# Patient Record
Sex: Female | Born: 1945
Health system: Southern US, Community
[De-identification: ages and names within clinical notes are randomized; demographics above are authoritative.]

## PROBLEM LIST (undated history)

## (undated) DIAGNOSIS — K760 Fatty (change of) liver, not elsewhere classified: Secondary | ICD-10-CM

## (undated) DIAGNOSIS — Z8249 Family history of ischemic heart disease and other diseases of the circulatory system: Secondary | ICD-10-CM

## (undated) DIAGNOSIS — N183 Chronic kidney disease, stage 3 unspecified: Secondary | ICD-10-CM

## (undated) DIAGNOSIS — M12812 Other specific arthropathies, not elsewhere classified, left shoulder: Secondary | ICD-10-CM

## (undated) DIAGNOSIS — N200 Calculus of kidney: Secondary | ICD-10-CM

## (undated) DIAGNOSIS — M5010 Cervical disc disorder with radiculopathy, unspecified cervical region: Secondary | ICD-10-CM

## (undated) DIAGNOSIS — E782 Mixed hyperlipidemia: Secondary | ICD-10-CM

## (undated) DIAGNOSIS — G4700 Insomnia, unspecified: Secondary | ICD-10-CM

## (undated) DIAGNOSIS — K5790 Diverticulosis of intestine, part unspecified, without perforation or abscess without bleeding: Secondary | ICD-10-CM

## (undated) DIAGNOSIS — N3281 Overactive bladder: Secondary | ICD-10-CM

## (undated) DIAGNOSIS — M8580 Other specified disorders of bone density and structure, unspecified site: Secondary | ICD-10-CM

## (undated) DIAGNOSIS — M1990 Unspecified osteoarthritis, unspecified site: Secondary | ICD-10-CM

## (undated) DIAGNOSIS — Z1211 Encounter for screening for malignant neoplasm of colon: Secondary | ICD-10-CM

## (undated) DIAGNOSIS — R066 Hiccough: Secondary | ICD-10-CM

## (undated) DIAGNOSIS — S43011A Anterior subluxation of right humerus, initial encounter: Secondary | ICD-10-CM

## (undated) DIAGNOSIS — K5710 Diverticulosis of small intestine without perforation or abscess without bleeding: Secondary | ICD-10-CM

## (undated) DIAGNOSIS — H33311 Horseshoe tear of retina without detachment, right eye: Secondary | ICD-10-CM

## (undated) DIAGNOSIS — M2042 Other hammer toe(s) (acquired), left foot: Secondary | ICD-10-CM

## (undated) DIAGNOSIS — I10 Essential (primary) hypertension: Secondary | ICD-10-CM

## (undated) DIAGNOSIS — Z8669 Personal history of other diseases of the nervous system and sense organs: Secondary | ICD-10-CM

## (undated) DIAGNOSIS — N281 Cyst of kidney, acquired: Secondary | ICD-10-CM

## (undated) DIAGNOSIS — M75102 Unspecified rotator cuff tear or rupture of left shoulder, not specified as traumatic: Secondary | ICD-10-CM

## (undated) DIAGNOSIS — M2041 Other hammer toe(s) (acquired), right foot: Secondary | ICD-10-CM

## (undated) DIAGNOSIS — M25552 Pain in left hip: Secondary | ICD-10-CM

## (undated) HISTORY — PX: CYST EXCISION: SHX5701

## (undated) HISTORY — DX: Other specific arthropathies, not elsewhere classified, left shoulder: M12.812

## (undated) HISTORY — DX: Overactive bladder: N32.81

## (undated) HISTORY — DX: Horseshoe tear of retina without detachment, right eye: H33.311

## (undated) HISTORY — DX: Unspecified osteoarthritis, unspecified site: M19.90

## (undated) HISTORY — PX: OTHER SURGICAL HISTORY: SHX169

## (undated) HISTORY — DX: Unspecified rotator cuff tear or rupture of left shoulder, not specified as traumatic: M75.102

## (undated) HISTORY — DX: Mixed hyperlipidemia: E78.2

## (undated) HISTORY — DX: Chronic kidney disease, stage 3 unspecified: N18.30

## (undated) HISTORY — DX: Other hammer toe(s) (acquired), right foot: M20.41

## (undated) HISTORY — DX: Encounter for screening for malignant neoplasm of colon: Z12.11

## (undated) HISTORY — DX: Personal history of other diseases of the nervous system and sense organs: Z86.69

## (undated) HISTORY — DX: Cyst of kidney, acquired: N28.1

## (undated) HISTORY — DX: Other hammer toe(s) (acquired), left foot: M20.42

## (undated) HISTORY — DX: Anterior subluxation of right humerus, initial encounter: S43.011A

## (undated) HISTORY — DX: Insomnia, unspecified: G47.00

## (undated) HISTORY — DX: Family history of ischemic heart disease and other diseases of the circulatory system: Z82.49

## (undated) HISTORY — DX: Hiccough: R06.6

## (undated) HISTORY — DX: Diverticulosis of intestine, part unspecified, without perforation or abscess without bleeding: K57.90

## (undated) HISTORY — DX: Diverticulosis of small intestine without perforation or abscess without bleeding: K57.10

## (undated) HISTORY — DX: Cervical disc disorder with radiculopathy, unspecified cervical region: M50.10

## (undated) HISTORY — DX: Fatty (change of) liver, not elsewhere classified: K76.0

## (undated) HISTORY — DX: Pain in left hip: M25.552

## (undated) HISTORY — DX: Essential (primary) hypertension: I10

## (undated) HISTORY — DX: Calculus of kidney: N20.0

---
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
## 1990-01-06 HISTORY — PX: OTHER SURGICAL HISTORY: SHX169

## 1992-01-07 DIAGNOSIS — N200 Calculus of kidney: Secondary | ICD-10-CM

## 1992-01-07 HISTORY — DX: Calculus of kidney: N20.0

## 2004-01-07 HISTORY — PX: BUNIONECTOMY: SHX129

## 2006-07-07 HISTORY — PX: COLONOSCOPY: SHX174

## 2008-11-16 HISTORY — PX: OTHER SURGICAL HISTORY: SHX169

## 2011-02-05 DIAGNOSIS — Z79899 Other long term (current) drug therapy: Secondary | ICD-10-CM | POA: Diagnosis not present

## 2011-02-05 DIAGNOSIS — E781 Pure hyperglyceridemia: Secondary | ICD-10-CM | POA: Diagnosis not present

## 2011-02-05 DIAGNOSIS — I10 Essential (primary) hypertension: Secondary | ICD-10-CM | POA: Diagnosis not present

## 2011-02-05 DIAGNOSIS — E7800 Pure hypercholesterolemia, unspecified: Secondary | ICD-10-CM | POA: Diagnosis not present

## 2011-03-20 DIAGNOSIS — H43399 Other vitreous opacities, unspecified eye: Secondary | ICD-10-CM | POA: Diagnosis not present

## 2011-03-20 DIAGNOSIS — H2510 Age-related nuclear cataract, unspecified eye: Secondary | ICD-10-CM | POA: Diagnosis not present

## 2011-06-27 DIAGNOSIS — Z79899 Other long term (current) drug therapy: Secondary | ICD-10-CM | POA: Diagnosis not present

## 2011-06-27 DIAGNOSIS — E7800 Pure hypercholesterolemia, unspecified: Secondary | ICD-10-CM | POA: Diagnosis not present

## 2011-06-27 DIAGNOSIS — E781 Pure hyperglyceridemia: Secondary | ICD-10-CM | POA: Diagnosis not present

## 2011-07-16 DIAGNOSIS — E781 Pure hyperglyceridemia: Secondary | ICD-10-CM | POA: Diagnosis not present

## 2011-07-16 DIAGNOSIS — Z8249 Family history of ischemic heart disease and other diseases of the circulatory system: Secondary | ICD-10-CM | POA: Diagnosis not present

## 2011-07-16 DIAGNOSIS — I10 Essential (primary) hypertension: Secondary | ICD-10-CM | POA: Diagnosis not present

## 2011-09-17 DIAGNOSIS — Z23 Encounter for immunization: Secondary | ICD-10-CM | POA: Diagnosis not present

## 2011-10-29 DIAGNOSIS — Z01419 Encounter for gynecological examination (general) (routine) without abnormal findings: Secondary | ICD-10-CM | POA: Diagnosis not present

## 2011-10-29 DIAGNOSIS — N951 Menopausal and female climacteric states: Secondary | ICD-10-CM | POA: Diagnosis not present

## 2011-11-24 DIAGNOSIS — E7800 Pure hypercholesterolemia, unspecified: Secondary | ICD-10-CM | POA: Diagnosis not present

## 2011-11-24 DIAGNOSIS — Z23 Encounter for immunization: Secondary | ICD-10-CM | POA: Diagnosis not present

## 2011-11-24 DIAGNOSIS — I10 Essential (primary) hypertension: Secondary | ICD-10-CM | POA: Diagnosis not present

## 2011-11-24 DIAGNOSIS — Z1231 Encounter for screening mammogram for malignant neoplasm of breast: Secondary | ICD-10-CM | POA: Diagnosis not present

## 2011-11-24 DIAGNOSIS — E781 Pure hyperglyceridemia: Secondary | ICD-10-CM | POA: Diagnosis not present

## 2012-03-15 DIAGNOSIS — D2330 Other benign neoplasm of skin of unspecified part of face: Secondary | ICD-10-CM | POA: Diagnosis not present

## 2012-03-15 DIAGNOSIS — L723 Sebaceous cyst: Secondary | ICD-10-CM | POA: Diagnosis not present

## 2012-05-24 DIAGNOSIS — H43319 Vitreous membranes and strands, unspecified eye: Secondary | ICD-10-CM | POA: Diagnosis not present

## 2012-05-24 DIAGNOSIS — H25019 Cortical age-related cataract, unspecified eye: Secondary | ICD-10-CM | POA: Diagnosis not present

## 2012-05-26 DIAGNOSIS — Z136 Encounter for screening for cardiovascular disorders: Secondary | ICD-10-CM | POA: Diagnosis not present

## 2012-05-26 DIAGNOSIS — I10 Essential (primary) hypertension: Secondary | ICD-10-CM | POA: Diagnosis not present

## 2012-05-26 DIAGNOSIS — E7800 Pure hypercholesterolemia, unspecified: Secondary | ICD-10-CM | POA: Diagnosis not present

## 2012-06-03 DIAGNOSIS — E7800 Pure hypercholesterolemia, unspecified: Secondary | ICD-10-CM | POA: Diagnosis not present

## 2012-06-03 DIAGNOSIS — I10 Essential (primary) hypertension: Secondary | ICD-10-CM | POA: Diagnosis not present

## 2012-06-03 DIAGNOSIS — E781 Pure hyperglyceridemia: Secondary | ICD-10-CM | POA: Diagnosis not present

## 2012-11-17 DIAGNOSIS — Z01419 Encounter for gynecological examination (general) (routine) without abnormal findings: Secondary | ICD-10-CM | POA: Diagnosis not present

## 2012-11-17 DIAGNOSIS — N951 Menopausal and female climacteric states: Secondary | ICD-10-CM | POA: Diagnosis not present

## 2012-11-25 DIAGNOSIS — Z1231 Encounter for screening mammogram for malignant neoplasm of breast: Secondary | ICD-10-CM | POA: Diagnosis not present

## 2012-12-01 DIAGNOSIS — E781 Pure hyperglyceridemia: Secondary | ICD-10-CM | POA: Diagnosis not present

## 2012-12-01 DIAGNOSIS — E7800 Pure hypercholesterolemia, unspecified: Secondary | ICD-10-CM | POA: Diagnosis not present

## 2012-12-13 DIAGNOSIS — M6740 Ganglion, unspecified site: Secondary | ICD-10-CM | POA: Diagnosis not present

## 2012-12-13 DIAGNOSIS — G4700 Insomnia, unspecified: Secondary | ICD-10-CM | POA: Diagnosis not present

## 2012-12-13 DIAGNOSIS — Z0000 Encounter for general adult medical examination without abnormal findings: Secondary | ICD-10-CM | POA: Diagnosis not present

## 2013-01-17 DIAGNOSIS — M6740 Ganglion, unspecified site: Secondary | ICD-10-CM | POA: Diagnosis not present

## 2013-02-01 DIAGNOSIS — M6740 Ganglion, unspecified site: Secondary | ICD-10-CM | POA: Diagnosis not present

## 2013-02-23 DIAGNOSIS — Z136 Encounter for screening for cardiovascular disorders: Secondary | ICD-10-CM | POA: Diagnosis not present

## 2013-03-11 DIAGNOSIS — C44300 Unspecified malignant neoplasm of skin of unspecified part of face: Secondary | ICD-10-CM | POA: Diagnosis not present

## 2013-05-18 DIAGNOSIS — H25019 Cortical age-related cataract, unspecified eye: Secondary | ICD-10-CM | POA: Diagnosis not present

## 2013-05-18 DIAGNOSIS — H43319 Vitreous membranes and strands, unspecified eye: Secondary | ICD-10-CM | POA: Diagnosis not present

## 2013-10-12 DIAGNOSIS — Z23 Encounter for immunization: Secondary | ICD-10-CM | POA: Diagnosis not present

## 2013-11-09 ENCOUNTER — Encounter: Payer: Self-pay | Admitting: Family Medicine

## 2013-11-09 ENCOUNTER — Ambulatory Visit (INDEPENDENT_AMBULATORY_CARE_PROVIDER_SITE_OTHER): Payer: Medicare Other | Admitting: Family Medicine

## 2013-11-09 VITALS — BP 134/78 | HR 80 | Temp 97.9°F | Resp 18 | Ht 64.5 in | Wt 152.0 lb

## 2013-11-09 DIAGNOSIS — E782 Mixed hyperlipidemia: Secondary | ICD-10-CM | POA: Diagnosis not present

## 2013-11-09 DIAGNOSIS — F409 Phobic anxiety disorder, unspecified: Secondary | ICD-10-CM | POA: Insufficient documentation

## 2013-11-09 DIAGNOSIS — Z23 Encounter for immunization: Secondary | ICD-10-CM

## 2013-11-09 DIAGNOSIS — G4700 Insomnia, unspecified: Secondary | ICD-10-CM

## 2013-11-09 DIAGNOSIS — F5105 Insomnia due to other mental disorder: Secondary | ICD-10-CM | POA: Diagnosis not present

## 2013-11-09 DIAGNOSIS — I10 Essential (primary) hypertension: Secondary | ICD-10-CM

## 2013-11-09 MED ORDER — FENOFIBRATE 54 MG PO TABS: 54.0000 mg | ORAL_TABLET | Freq: Every day | ORAL | Status: DC

## 2013-11-09 MED ORDER — SIMVASTATIN 40 MG PO TABS: 40.0000 mg | ORAL_TABLET | Freq: Every day | ORAL | Status: DC

## 2013-11-09 MED ORDER — ZOLPIDEM TARTRATE 5 MG PO TABS: 5.0000 mg | ORAL_TABLET | Freq: Every evening | ORAL | Status: DC | PRN

## 2013-11-09 MED ORDER — LISINOPRIL 5 MG PO TABS: 5.0000 mg | ORAL_TABLET | Freq: Every day | ORAL | Status: DC

## 2013-11-09 NOTE — Progress Notes (Signed)
Spoke with pt, advised lab results. Pt understood. 

## 2013-11-09 NOTE — Assessment & Plan Note (Signed)
The current medical regimen is effective;  continue present plan and medications. BMET when fasting at pt's convenience.

## 2013-11-09 NOTE — Progress Notes (Addendum)
Office Note 11/09/2013  CC:  Chief Complaint  Patient presents with  . Establish Care   HPI:  Karen Bell is a 68 y.o. White female who is here to establish care. Patient's most recent primary MD: [facility] in [place].  Dr. Ouida Sills was her GYN there. Denies any hx of abnormal paps.  Last pap was 2013.  She got pneumovax and Tdap in 2011. Old records were not reviewed prior to or during today's visit except for a printout of a metabolic panel and lipid panel from 12/01/12 (all normal).  Historically well controlled HTN and hyperlipidemia. Describes sleep prob for decades.  no problem initiating sleep but wakes up frequently in the night and finds it difficult to get back to sleep. No depressed mood.  Trazodone has been rx'd but no help.  No other sleep aid tried.   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  No panic attacks.   Past Medical History  Diagnosis Date  . HTN (hypertension)   . Hyperlipidemia, mixed   . Family history of abdominal aortic aneurysm     pt has had AAA screening x 2, most recent 2014  . Insomnia     maintenance problems; trazodone no help  . History of migraine     These resolved after menopause  . Nephrolithiasis     Past Surgical History  Procedure Laterality Date  . Kidney stone extraction  1992    No procedures for this since then.  . Cesarean section  x 2   . Bunionectomy  2006    bilat  . Cyst excision      right index finger, base of fingernail  . Colonoscopy  2002;2012    recall 10 yrs    Family History  Problem Relation Age of Onset  . Cancer Mother     ? ovarian vs endometrial  . Hyperlipidemia Father   . Hypertension Father   . Bladder Cancer Brother   . Colon cancer      Negative history.  . Diabetes      History   Social History  . Marital Status: Unknown    Spouse Name: N/A    Number of Children: N/A  . Years of Education: N/A   Occupational  History  . Not on file.   Social History Main Topics  . Smoking status: Never Smoker   . Smokeless tobacco: Not on file  . Alcohol Use: Not on file  . Drug Use: Not on file  . Sexual Activity: Not on file   Other Topics Concern  . Not on file   Social History Narrative   Married, 2 daughters.  Four older brothers, 3 of which have had abdominal aneurisms.   Relocated from [place] area 05/2013.   Occupation: retired Research officer, political party.   No tob, occ alcohol.      Outpatient Encounter Prescriptions as of 11/09/2013  Medication Sig  . aspirin 81 MG tablet Take 81 mg by mouth daily.  . calcium citrate-vitamin D (CITRACAL+D) 315-200 MG-UNIT per tablet Take 1 tablet by mouth daily.  Marland Kitchen co-enzyme Q-10 30 MG capsule Take 300 mg by mouth 3 (three) times daily.  . fenofibrate 54 MG tablet Take 1 tablet (54 mg total) by mouth daily.  Marland Kitchen lisinopril (PRINIVIL,ZESTRIL) 5 MG tablet Take 1 tablet (5 mg total) by mouth daily.  . Multiple Vitamins-Minerals (CENTRUM SILVER PO) Take by mouth.  . Omega-3 Fatty Acids (  FISH OIL) 1200 MG CAPS Take 1,200 mg by mouth.  . simvastatin (ZOCOR) 40 MG tablet Take 1 tablet (40 mg total) by mouth daily.  Marland Kitchen zolpidem (AMBIEN) 5 MG tablet Take 1 tablet (5 mg total) by mouth at bedtime as needed for sleep.  . [DISCONTINUED] fenofibrate 54 MG tablet Take 54 mg by mouth daily.  . [DISCONTINUED] lisinopril (PRINIVIL,ZESTRIL) 5 MG tablet Take 5 mg by mouth daily.  . [DISCONTINUED] simvastatin (ZOCOR) 40 MG tablet Take 40 mg by mouth daily.  . [DISCONTINUED] traZODone (DESYREL) 50 MG tablet Take 50 mg by mouth at bedtime.  . [DISCONTINUED] zolpidem (AMBIEN) 5 MG tablet Take 1 tablet (5 mg total) by mouth at bedtime as needed for sleep.    Allergies  Allergen Reactions  . Erythromycin Other (See Comments)    Abdominal pain  . Tetracyclines & Related Other (See Comments)    Joint pain    ROS Review of Systems  Constitutional: Negative for fever and fatigue.  HENT:  Negative for congestion and sore throat.   Eyes: Negative for visual disturbance.  Respiratory: Negative for cough.   Cardiovascular: Negative for chest pain.  Gastrointestinal: Negative for nausea and abdominal pain.  Genitourinary: Negative for dysuria.  Musculoskeletal: Negative for back pain and joint swelling.  Skin: Negative for rash.  Neurological: Negative for weakness and headaches.  Hematological: Negative for adenopathy.  Psychiatric/Behavioral: Positive for sleep disturbance.    PE; Blood pressure 134/78, pulse 80, temperature 97.9 F (36.6 C), temperature source Oral, resp. rate 18, height 5' 4.5" (1.638 m), weight 152 lb (68.947 kg), SpO2 95 %. Gen: Alert, well appearing.  Patient is oriented to person, place, time, and situation. UDJ:SHFW: no injection, icteris, swelling, or exudate.  EOMI, PERRLA. Mouth: lips without lesion/swelling.  Oral mucosa pink and moist. Oropharynx without erythema, exudate, or swelling.  CV: RRR, no m/r/g.   LUNGS: CTA bilat, nonlabored resps, good aeration in all lung fields.  Pertinent labs:  none  ASSESSMENT AND PLAN:   New pt: obtain old records.  Essential hypertension The current medical regimen is effective;  continue present plan and medications. BMET when fasting at pt's convenience.  Hyperlipemia, mixed The current medical regimen is effective;  continue present plan and medications. FLP and AST/ALT at pt's earliest convenience.  Insomnia due to anxiety and fear D/c trazodone. Trial of ambien 5mg , 1 qhs prn.  Therapeutic expectations and side effect profile of medication discussed today.  Patient's questions answered. Gave rx for 30 d supply to local pharmacy and if this helps she can send in a printed 90d rx that has 1 additional RF--I handed her both rx's today.  Preventative health care: prevnar 13 IM today.  She recently had her flu vaccine for this season.  Pt prefers to have cervical cancer screening done via a GYN,  so she'll talk to her daughter about who she should see and will call here for referral in future.  An After Visit Summary was printed and given to the patient.  Return in about 6 months (around 05/10/2014) for routine chronic illness f/u; needs lab visit at her convenience for fasting labs (ordered).

## 2013-11-09 NOTE — Assessment & Plan Note (Signed)
The current medical regimen is effective;  continue present plan and medications. FLP and AST/ALT at pt's earliest convenience.

## 2013-11-09 NOTE — Assessment & Plan Note (Signed)
D/c trazodone. Trial of ambien 5mg , 1 qhs prn.  Therapeutic expectations and side effect profile of medication discussed today.  Patient's questions answered. Gave rx for 30 d supply to local pharmacy and if this helps she can send in a printed 90d rx that has 1 additional RF--I handed her both rx's today.

## 2013-11-10 ENCOUNTER — Telehealth: Payer: Self-pay | Admitting: Family Medicine

## 2013-11-10 NOTE — Telephone Encounter (Signed)
emmi emailed °

## 2013-12-20 ENCOUNTER — Other Ambulatory Visit (INDEPENDENT_AMBULATORY_CARE_PROVIDER_SITE_OTHER): Payer: Medicare Other

## 2013-12-20 DIAGNOSIS — I10 Essential (primary) hypertension: Secondary | ICD-10-CM | POA: Diagnosis not present

## 2013-12-20 DIAGNOSIS — G4700 Insomnia, unspecified: Secondary | ICD-10-CM | POA: Diagnosis not present

## 2013-12-20 DIAGNOSIS — E782 Mixed hyperlipidemia: Secondary | ICD-10-CM

## 2013-12-20 LAB — CBC WITH DIFFERENTIAL/PLATELET
Basophils Absolute: 0 10*3/uL (ref 0.0–0.1)
Basophils Relative: 0.6 % (ref 0.0–3.0)
Eosinophils Absolute: 0.1 10*3/uL (ref 0.0–0.7)
Eosinophils Relative: 1.8 % (ref 0.0–5.0)
HEMATOCRIT: 46 % (ref 36.0–46.0)
HEMOGLOBIN: 15.1 g/dL — AB (ref 12.0–15.0)
LYMPHS ABS: 2.6 10*3/uL (ref 0.7–4.0)
Lymphocytes Relative: 42.6 % (ref 12.0–46.0)
MCHC: 32.7 g/dL (ref 30.0–36.0)
MCV: 92 fl (ref 78.0–100.0)
MONOS PCT: 8.1 % (ref 3.0–12.0)
Monocytes Absolute: 0.5 10*3/uL (ref 0.1–1.0)
NEUTROS ABS: 2.8 10*3/uL (ref 1.4–7.7)
Neutrophils Relative %: 46.9 % (ref 43.0–77.0)
Platelets: 274 10*3/uL (ref 150.0–400.0)
RBC: 5 Mil/uL (ref 3.87–5.11)
RDW: 13.3 % (ref 11.5–15.5)
WBC: 6.1 10*3/uL (ref 4.0–10.5)

## 2013-12-20 LAB — LIPID PANEL
Cholesterol: 162 mg/dL (ref 0–200)
HDL: 49 mg/dL (ref >39.00)
LDL Cholesterol: 83 mg/dL (ref 0–99)
NonHDL: 113
Total CHOL/HDL Ratio: 3
Triglycerides: 152 mg/dL — ABNORMAL HIGH (ref 0.0–149.0)
VLDL: 30.4 mg/dL (ref 0.0–40.0)

## 2013-12-20 LAB — COMPREHENSIVE METABOLIC PANEL
ALT: 26 U/L (ref 0–35)
AST: 28 U/L (ref 0–37)
Albumin: 4.7 g/dL (ref 3.5–5.2)
Alkaline Phosphatase: 44 U/L (ref 39–117)
BUN: 22 mg/dL (ref 6–23)
CHLORIDE: 101 meq/L (ref 96–112)
CO2: 28 meq/L (ref 19–32)
CREATININE: 0.7 mg/dL (ref 0.4–1.2)
Calcium: 9.9 mg/dL (ref 8.4–10.5)
GFR: 84.19 mL/min (ref >60.00)
GLUCOSE: 86 mg/dL (ref 70–99)
Potassium: 4.3 mEq/L (ref 3.5–5.1)
Sodium: 135 mEq/L (ref 135–145)
Total Bilirubin: 0.6 mg/dL (ref 0.2–1.2)
Total Protein: 7.7 g/dL (ref 6.0–8.3)

## 2013-12-20 LAB — TSH: TSH: 4.61 u[IU]/mL — ABNORMAL HIGH (ref 0.35–4.50)

## 2014-03-20 DIAGNOSIS — D1801 Hemangioma of skin and subcutaneous tissue: Secondary | ICD-10-CM | POA: Diagnosis not present

## 2014-03-20 DIAGNOSIS — D225 Melanocytic nevi of trunk: Secondary | ICD-10-CM | POA: Diagnosis not present

## 2014-03-20 DIAGNOSIS — D2239 Melanocytic nevi of other parts of face: Secondary | ICD-10-CM | POA: Diagnosis not present

## 2014-03-20 DIAGNOSIS — L821 Other seborrheic keratosis: Secondary | ICD-10-CM | POA: Diagnosis not present

## 2014-03-20 DIAGNOSIS — D2262 Melanocytic nevi of left upper limb, including shoulder: Secondary | ICD-10-CM | POA: Diagnosis not present

## 2014-04-05 DIAGNOSIS — H40019 Open angle with borderline findings, low risk, unspecified eye: Secondary | ICD-10-CM | POA: Diagnosis not present

## 2014-04-05 DIAGNOSIS — H43811 Vitreous degeneration, right eye: Secondary | ICD-10-CM | POA: Diagnosis not present

## 2014-04-05 DIAGNOSIS — H1852 Epithelial (juvenile) corneal dystrophy: Secondary | ICD-10-CM | POA: Diagnosis not present

## 2014-04-05 DIAGNOSIS — H2513 Age-related nuclear cataract, bilateral: Secondary | ICD-10-CM | POA: Diagnosis not present

## 2014-04-05 DIAGNOSIS — H25013 Cortical age-related cataract, bilateral: Secondary | ICD-10-CM | POA: Diagnosis not present

## 2014-05-09 DIAGNOSIS — H40013 Open angle with borderline findings, low risk, bilateral: Secondary | ICD-10-CM | POA: Diagnosis not present

## 2014-05-12 ENCOUNTER — Ambulatory Visit (INDEPENDENT_AMBULATORY_CARE_PROVIDER_SITE_OTHER): Payer: Medicare Other | Admitting: Family Medicine

## 2014-05-12 ENCOUNTER — Encounter: Payer: Self-pay | Admitting: Family Medicine

## 2014-05-12 VITALS — BP 129/82 | HR 65 | Temp 99.1°F | Resp 16 | Ht 65.0 in | Wt 151.0 lb

## 2014-05-12 DIAGNOSIS — Z0000 Encounter for general adult medical examination without abnormal findings: Secondary | ICD-10-CM

## 2014-05-12 DIAGNOSIS — Z124 Encounter for screening for malignant neoplasm of cervix: Secondary | ICD-10-CM | POA: Diagnosis not present

## 2014-05-12 DIAGNOSIS — H9193 Unspecified hearing loss, bilateral: Secondary | ICD-10-CM | POA: Diagnosis not present

## 2014-05-12 NOTE — Progress Notes (Signed)
Pre visit review using our clinic review tool, if applicable. No additional management support is needed unless otherwise documented below in the visit note. 

## 2014-05-12 NOTE — Progress Notes (Signed)
The patient is here for annual Medicare wellness examination and management of other chronic and acute problems. She is relatively new to my practice, I saw her for the first time 11/09/13 after she recently relocated to the area.  Just got prior PCPs records and reviewed them today--updated info as appropriate.  HTN: Reviewed bp and HR measurements she has logged over the last few months and average bp is 120s over 70s, HR avg 70s.  Insomnia: did not fill zolpidem rx I gave her last time. Melatonin 5mg  qhs used for a couple of months and she found this helpful to reset things and she now sleeps 6-7 hours uninterrupted.  Doing fine OFF med now.     The risk factors are reflected in the social history.  The roster of all physicians providing medical care to patient - is listed in the Snapshot section of the chart.  Activities of daily living:  The patient is 100% inedpendent in all ADLs: dressing, toileting, feeding as well as independent mobility  Home safety : The patient has smoke detectors in the home. They wear seatbelts.No firearms at home ( firearms are present in the home, kept in a safe fashion). There is no violence in the home.   There is no risks for hepatitis, STDs or HIV. There is no history of blood transfusion. They have no travel history to infectious disease endemic areas of the world.  The patient has seen their dentist in the last six month. They have seen their eye doctor in the last year: optometrist at Dr. Payton Emerald office--everything fine. They admit to hearing difficulty and have not had audiologic testing in the last year.  Says audiology testing 20+ years ago when had similar c/o was normal.  Says background noise is the main factor.  They do not  have excessive sun exposure.   Discussed the need for sun protection: hats, long sleeves and use of sunscreen if there is significant sun exposure.   Diet: the importance of a healthy diet is discussed. They do have a healthy  diet.  Going through a health/wellness program at [facility] is noting improvement.    The patient has a regular exercise program: 30 min 3-5 days per week.  The benefits of regular aerobic exercise were discussed.  Depression screen: there are no signs or vegative symptoms of depression- irritability, change in appetite, anhedonia, sadness/tearfullness.  Fall risk assessment done today: no risk.  Cognitive assessment: the patient manages all their financial and personal affairs and is actively engaged.  They could relate day,date,year and events; recalled 3/3 objects at 3 minutes; performed clock-face test normally.  The following portions of the patient's history were reviewed and updated as appropriate: allergies, current medications, past family history, past medical history,  past surgical history, past social history  and problem list.  Vision, hearing, body mass index were assessed and reviewed.  BMI 25-discussed with pt today.  During the course of the visit the patient was educated and counseled about appropriate screening and preventive services including : fall prevention , diabetes screening, nutrition counseling, colorectal cancer screening, and recommended immunizations.  Plan: refer to GYN for cervical cancer and breast cancer screening, also for DEXA. No vaccines due, but she needs pneumovax 23 (final) after 11/09/2013. Diabetes screening done 12/2013. Next colon ca screening due 07/2016. Next screening for AAA (+strong FH) due spring 2017.  F/u: 6 mo

## 2014-05-12 NOTE — Patient Instructions (Signed)
Verify the date of most recent colonoscopy and most recent abdominal aortic aneurism screening ultrasound.-thx

## 2014-05-21 ENCOUNTER — Encounter: Payer: Self-pay | Admitting: Family Medicine

## 2014-06-06 DIAGNOSIS — Z01419 Encounter for gynecological examination (general) (routine) without abnormal findings: Secondary | ICD-10-CM | POA: Diagnosis not present

## 2014-06-06 DIAGNOSIS — Z124 Encounter for screening for malignant neoplasm of cervix: Secondary | ICD-10-CM | POA: Diagnosis not present

## 2014-07-03 DIAGNOSIS — Z1231 Encounter for screening mammogram for malignant neoplasm of breast: Secondary | ICD-10-CM | POA: Diagnosis not present

## 2014-09-14 DIAGNOSIS — Z23 Encounter for immunization: Secondary | ICD-10-CM | POA: Diagnosis not present

## 2014-11-13 ENCOUNTER — Encounter: Payer: Self-pay | Admitting: Family Medicine

## 2014-11-13 ENCOUNTER — Ambulatory Visit (INDEPENDENT_AMBULATORY_CARE_PROVIDER_SITE_OTHER): Payer: Medicare Other | Admitting: Family Medicine

## 2014-11-13 VITALS — BP 120/84 | HR 59 | Temp 97.8°F | Resp 16 | Ht 65.0 in | Wt 152.0 lb

## 2014-11-13 DIAGNOSIS — Z136 Encounter for screening for cardiovascular disorders: Secondary | ICD-10-CM

## 2014-11-13 DIAGNOSIS — R7989 Other specified abnormal findings of blood chemistry: Secondary | ICD-10-CM

## 2014-11-13 DIAGNOSIS — I10 Essential (primary) hypertension: Secondary | ICD-10-CM | POA: Diagnosis not present

## 2014-11-13 DIAGNOSIS — R946 Abnormal results of thyroid function studies: Secondary | ICD-10-CM | POA: Diagnosis not present

## 2014-11-13 DIAGNOSIS — E782 Mixed hyperlipidemia: Secondary | ICD-10-CM

## 2014-11-13 LAB — TSH: TSH: 2.851 u[IU]/mL (ref 0.350–4.500)

## 2014-11-13 LAB — LIPID PANEL
CHOL/HDL RATIO: 4.1 ratio (ref <=5.0)
Cholesterol: 168 mg/dL (ref 125–200)
HDL: 41 mg/dL — ABNORMAL LOW (ref >=46)
LDL Cholesterol: 92 mg/dL (ref <130)
Triglycerides: 174 mg/dL — ABNORMAL HIGH (ref <150)
VLDL: 35 mg/dL — AB (ref <30)

## 2014-11-13 LAB — COMPREHENSIVE METABOLIC PANEL
ALK PHOS: 46 U/L (ref 33–130)
ALT: 20 U/L (ref 6–29)
AST: 22 U/L (ref 10–35)
Albumin: 4.4 g/dL (ref 3.6–5.1)
BILIRUBIN TOTAL: 0.4 mg/dL (ref 0.2–1.2)
BUN: 20 mg/dL (ref 7–25)
CO2: 26 mmol/L (ref 20–31)
CREATININE: 0.73 mg/dL (ref 0.50–0.99)
Calcium: 9.2 mg/dL (ref 8.6–10.4)
Chloride: 100 mmol/L (ref 98–110)
GLUCOSE: 83 mg/dL (ref 65–99)
Potassium: 4.7 mmol/L (ref 3.5–5.3)
SODIUM: 138 mmol/L (ref 135–146)
Total Protein: 7.1 g/dL (ref 6.1–8.1)

## 2014-11-13 LAB — T4, FREE: Free T4: 0.95 ng/dL (ref 0.80–1.80)

## 2014-11-13 LAB — T3: T3, Total: 100.6 ng/dL (ref 80.0–204.0)

## 2014-11-13 MED ORDER — FENOFIBRATE 54 MG PO TABS: 54.0000 mg | ORAL_TABLET | Freq: Every day | ORAL | Status: DC

## 2014-11-13 MED ORDER — SIMVASTATIN 40 MG PO TABS: 40.0000 mg | ORAL_TABLET | Freq: Every day | ORAL | Status: DC

## 2014-11-13 MED ORDER — LISINOPRIL 5 MG PO TABS: 5.0000 mg | ORAL_TABLET | Freq: Every day | ORAL | Status: DC

## 2014-11-13 NOTE — Progress Notes (Signed)
OFFICE VISIT  11/13/2014   CC:  Chief Complaint  Patient presents with  . Follow-up    Pt is fasting.     HPI:    Patient is a 69 y.o. Caucasian female who presents for 6 mo f/u HTN, hyperlipidemia, hx of mildly elevated TSH w/out T4 or T3 testing. Compliant with chronic meds.  No acute complaints.  Since last visit here she went to Dr. Benjie Karvonen in [facility] and got pap smear and mammogram, which pt reports were both normal (06/2014).   Past Medical History  Diagnosis Date  . HTN (hypertension)   . Hyperlipidemia, mixed   . Family history of abdominal aortic aneurysm     pt has had AAA screening x 2, most recent was Spring 2015  . Insomnia     maintenance problems; trazodone no help  . History of migraine     These resolved after menopause  . Nephrolithiasis 1994  . Diverticulosis   . Abnormal Heart Score CT 02/06/09     CT heart score 91    Past Surgical History  Procedure Laterality Date  . Kidney stone extraction  1992    Cystoscopy: fragmented ureteral stone.  No procedures for this since then.  . Cesarean section  x 2     [place]  . Bunionectomy  2006    bilat  . Cyst excision      right index finger, base of fingernail  . Colonoscopy  2012    recall 10 yrs.  'tics.  . Carotid dopplers (screening for pad)  11/16/2008    "minor left carotid dz" per old records.  No hx of TIA/CVA.  Marland Kitchen Aortic ultrasound  02/13/13    no aneurism (screening for high risk)    Outpatient Prescriptions Prior to Visit  Medication Sig Dispense Refill  . aspirin 81 MG tablet Take 81 mg by mouth daily.    . calcium citrate-vitamin D (CITRACAL+D) 315-200 MG-UNIT per tablet Take 1 tablet by mouth daily.    . Cetirizine HCl (KLS ALLER-TEC PO) Take 10 mg by mouth daily.    . Multiple Vitamins-Minerals (CENTRUM SILVER PO) Take by mouth.    . Omega-3 Fatty Acids (FISH OIL) 1200 MG CAPS Take 1,200 mg by mouth.    . fenofibrate 54 MG tablet Take 1 tablet (54 mg total) by mouth daily. 90 tablet 3  .  lisinopril (PRINIVIL,ZESTRIL) 5 MG tablet Take 1 tablet (5 mg total) by mouth daily. 90 tablet 3  . simvastatin (ZOCOR) 40 MG tablet Take 1 tablet (40 mg total) by mouth daily. 90 tablet 3  . co-enzyme Q-10 30 MG capsule Take 300 mg by mouth 3 (three) times daily.    . niacin 500 MG tablet Take 500 mg by mouth at bedtime.     No facility-administered medications prior to visit.    Allergies  Allergen Reactions  . Erythromycin Other (See Comments)    Abdominal pain  . Tetracyclines & Related Other (See Comments)    Joint pain    ROS As per HPI  PE: Blood pressure 120/84, pulse 59, temperature 97.8 F (36.6 C), temperature source Oral, resp. rate 16, height 5\' 5"  (1.651 m), weight 152 lb (68.947 kg), SpO2 100 %. Gen: Alert, well appearing.  Patient is oriented to person, place, time, and situation. CV: RRR, no m/r/g.   LUNGS: CTA bilat, nonlabored resps, good aeration in all lung fields.   LABS:  Lab Results  Component Value Date   TSH  4.61* 12/20/2013   Lab Results  Component Value Date   WBC 6.1 12/20/2013   HGB 15.1* 12/20/2013   HCT 46.0 12/20/2013   MCV 92.0 12/20/2013   PLT 274.0 12/20/2013   Lab Results  Component Value Date   CREATININE 0.7 12/20/2013   BUN 22 12/20/2013   NA 135 12/20/2013   K 4.3 12/20/2013   CL 101 12/20/2013   CO2 28 12/20/2013   Lab Results  Component Value Date   ALT 26 12/20/2013   AST 28 12/20/2013   ALKPHOS 44 12/20/2013   BILITOT 0.6 12/20/2013   Lab Results  Component Value Date   CHOL 162 12/20/2013   Lab Results  Component Value Date   HDL 49.00 12/20/2013   Lab Results  Component Value Date   LDLCALC 83 12/20/2013   Lab Results  Component Value Date   TRIG 152.0* 12/20/2013   Lab Results  Component Value Date   CHOLHDL 3 12/20/2013    IMPRESSION AND PLAN:  1) HTN; The current medical regimen is effective;  continue present plan and medications. Lytes/cr today.  2) Hyperlipidemia: The current medical  regimen is effective;  continue present plan and medications. FLP, AST/ALT today.  3) Elevated TSH: just barely elevated 12/2013.  Will recheck this today and check T4 and T3 as well.  4) FH of aortic aneurism: needs q2 yr aortic aneurism screening, ordered her next one today to be set for 02/2015.  5) Prev health care: due for pneumovax, so she'll get this at her son's pharmacy ASAP.  An After Visit Summary was printed and given to the patient.  FOLLOW UP: Return in about 6 months (around 05/13/2015) for medicare AWV.

## 2014-11-13 NOTE — Progress Notes (Signed)
Pre visit review using our clinic review tool, if applicable. No additional management support is needed unless otherwise documented below in the visit note. 

## 2015-02-07 ENCOUNTER — Telehealth: Payer: Self-pay | Admitting: *Deleted

## 2015-02-07 DIAGNOSIS — Z780 Asymptomatic menopausal state: Secondary | ICD-10-CM

## 2015-02-07 NOTE — Telephone Encounter (Signed)
-----   Message from Nickola Major sent at 02/07/2015  8:57 AM EST ----- Patient is going for her [place], Hoyle Sauer from [employer] said the patient would like to get her bone density exam done on the same day. Can you put in an order? Hoyle Sauer said it's okay to use preventative or post menopausal for dx. Thanks

## 2015-02-07 NOTE — Telephone Encounter (Signed)
Please advise. Thanks.  

## 2015-02-07 NOTE — Telephone Encounter (Signed)
OK, order is in!

## 2015-02-13 ENCOUNTER — Ambulatory Visit (HOSPITAL_BASED_OUTPATIENT_CLINIC_OR_DEPARTMENT_OTHER)
Admission: RE | Admit: 2015-02-13 | Discharge: 2015-02-13 | Disposition: A | Discharge status: Home / Self Care | Payer: Medicare HMO | Source: Ambulatory Visit | Attending: Family Medicine | Admitting: Family Medicine

## 2015-02-13 ENCOUNTER — Encounter (HOSPITAL_BASED_OUTPATIENT_CLINIC_OR_DEPARTMENT_OTHER): Payer: Self-pay

## 2015-02-13 DIAGNOSIS — Z136 Encounter for screening for cardiovascular disorders: Secondary | ICD-10-CM | POA: Diagnosis not present

## 2015-02-13 DIAGNOSIS — Z780 Asymptomatic menopausal state: Secondary | ICD-10-CM | POA: Insufficient documentation

## 2015-02-13 DIAGNOSIS — Z13820 Encounter for screening for osteoporosis: Secondary | ICD-10-CM | POA: Diagnosis not present

## 2015-02-13 DIAGNOSIS — Z8249 Family history of ischemic heart disease and other diseases of the circulatory system: Secondary | ICD-10-CM | POA: Diagnosis not present

## 2015-02-13 DIAGNOSIS — M810 Age-related osteoporosis without current pathological fracture: Secondary | ICD-10-CM | POA: Diagnosis not present

## 2015-03-08 NOTE — Telephone Encounter (Signed)
BMD done on 02/13/15.

## 2015-04-07 DIAGNOSIS — M75102 Unspecified rotator cuff tear or rupture of left shoulder, not specified as traumatic: Secondary | ICD-10-CM

## 2015-04-07 DIAGNOSIS — M12812 Other specific arthropathies, not elsewhere classified, left shoulder: Secondary | ICD-10-CM

## 2015-04-07 HISTORY — DX: Other specific arthropathies, not elsewhere classified, left shoulder: M12.812

## 2015-04-07 HISTORY — DX: Unspecified rotator cuff tear or rupture of left shoulder, not specified as traumatic: M75.102

## 2015-04-11 ENCOUNTER — Encounter: Payer: Self-pay | Admitting: Family Medicine

## 2015-04-11 ENCOUNTER — Ambulatory Visit (INDEPENDENT_AMBULATORY_CARE_PROVIDER_SITE_OTHER): Payer: Medicare HMO | Admitting: Family Medicine

## 2015-04-11 VITALS — BP 126/83 | HR 77 | Temp 98.1°F | Resp 16 | Ht 65.0 in | Wt 151.5 lb

## 2015-04-11 DIAGNOSIS — M7522 Bicipital tendinitis, left shoulder: Secondary | ICD-10-CM | POA: Diagnosis not present

## 2015-04-11 DIAGNOSIS — M129 Arthropathy, unspecified: Secondary | ICD-10-CM | POA: Diagnosis not present

## 2015-04-11 DIAGNOSIS — M19019 Primary osteoarthritis, unspecified shoulder: Secondary | ICD-10-CM

## 2015-04-11 DIAGNOSIS — K219 Gastro-esophageal reflux disease without esophagitis: Secondary | ICD-10-CM | POA: Diagnosis not present

## 2015-04-11 NOTE — Patient Instructions (Signed)
Buy otc generic zantac 150mg  and take 1 tab twice a day for your reflux.  If this is not helpful in 4-5 days, stop this med and try generic otc prilosec once a day in the morning before eating.

## 2015-04-11 NOTE — Progress Notes (Signed)
Pre visit review using our clinic review tool, if applicable. No additional management support is needed unless otherwise documented below in the visit note. 

## 2015-04-11 NOTE — Progress Notes (Signed)
OFFICE VISIT  04/11/2015   CC:  Chief Complaint  Patient presents with  . Shoulder Pain    left shoulder x 6 months   HPI:    Patient is a 70 y.o. Caucasian female who presents for left shoulder pain. Onset about 6 mo ago, mostly over deltoid , occ over tip of acromion, and also sometimes L upper chest wall. Raising arm exacerbates the pain.  Some mornings there is a dull ache w/out movement, but most of the time if she sits w arm in neutral position she has no pain.  No distinct injury recalled but she recalls a couple of things she does that make her use that arm more.  Intensity of the pain is 6-7/10 for brief periods with movements.  Occ flash of tingling down L forearm and occ "odd" dullness feeling in fingertips.  Says she always has "tightness" in neck, esp on L side.  Says she recalls being told she had arthritis in neck.  Not trying any meds for it.  Recent 1 week of worsened GERD, substernal fullness/burning that even goes up into neck at times.  Lots of burping after eating.  Not taking any NSAIDs.  Past Medical History  Diagnosis Date  . HTN (hypertension)   . Hyperlipidemia, mixed   . Family history of abdominal aortic aneurysm     pt has had AAA screening x 2, most recent was 02/2015.  Marland Kitchen Insomnia     maintenance problems; trazodone no help  . History of migraine     These resolved after menopause  . Nephrolithiasis 1994  . Diverticulosis   . Abnormal Heart Score CT 02/06/09     CT heart score 91    Past Surgical History  Procedure Laterality Date  . Kidney stone extraction  1992    Cystoscopy: fragmented ureteral stone.  No procedures for this since then.  . Cesarean section  x 2     [place]  . Bunionectomy  2006    bilat  . Cyst excision      right index finger, base of fingernail  . Colonoscopy  2012    recall 10 yrs.  'tics.  . Carotid dopplers (screening for pad)  11/16/2008    "minor left carotid dz" per old records.  No hx of TIA/CVA.  Marland Kitchen Aortic  ultrasound  02/13/13; 02/2015    no aneurism (screening for high risk)    Outpatient Prescriptions Prior to Visit  Medication Sig Dispense Refill  . aspirin 81 MG tablet Take 81 mg by mouth daily.    . calcium citrate-vitamin D (CITRACAL+D) 315-200 MG-UNIT per tablet Take 1 tablet by mouth daily.    . fenofibrate 54 MG tablet Take 1 tablet (54 mg total) by mouth daily. 90 tablet 3  . lisinopril (PRINIVIL,ZESTRIL) 5 MG tablet Take 1 tablet (5 mg total) by mouth daily. 90 tablet 3  . Multiple Vitamins-Minerals (CENTRUM SILVER PO) Take by mouth.    . Omega-3 Fatty Acids (FISH OIL) 1200 MG CAPS Take 1,200 mg by mouth.    . simvastatin (ZOCOR) 40 MG tablet Take 1 tablet (40 mg total) by mouth daily. 90 tablet 3  . Cetirizine HCl (KLS ALLER-TEC PO) Take 10 mg by mouth daily. Reported on 04/11/2015     No facility-administered medications prior to visit.    Allergies  Allergen Reactions  . Erythromycin Other (See Comments)    Abdominal pain  . Tetracyclines & Related Other (See Comments)  Joint pain    ROS As per HPI  PE: Blood pressure 126/83, pulse 77, temperature 98.1 F (36.7 C), temperature source Oral, resp. rate 16, height 5\' 5"  (1.651 m), weight 151 lb 8 oz (68.72 kg), SpO2 96 %. Gen: Alert, well appearing.  Patient is oriented to person, place, time, and situation. Neck: no TTP.  ROM fully intact.  Left shoulder without tenderness except for the bicipital groove and also focally over the L AC joint.  No impingement signs.  ABduction with mild pain over AC area.  ER/IR w/out limitation or pain. +Speed's and Yergason's tests.  LABS:  none  IMPRESSION AND PLAN:  1) Left shoulder pain: suspect L AC joint arthritis + L bicipital tendonitis. Pt averse to taking NSAIDS, esp with GERD problems lately. Refer to sports medicine for further eval/mgmt.  2) GERD: Pt instructions: Buy otc generic zantac 150mg  and take 1 tab twice a day for your reflux.  If this is not helpful in 4-5  days, stop this med and try generic otc prilosec once a day in the morning before eating.  An After Visit Summary was printed and given to the patient.  FOLLOW UP: Return if symptoms worsen or fail to improve.  Signed:  Crissie Sickles, MD           04/11/2015

## 2015-05-01 ENCOUNTER — Ambulatory Visit (INDEPENDENT_AMBULATORY_CARE_PROVIDER_SITE_OTHER): Payer: Medicare HMO | Admitting: Family Medicine

## 2015-05-01 ENCOUNTER — Other Ambulatory Visit (INDEPENDENT_AMBULATORY_CARE_PROVIDER_SITE_OTHER): Payer: Medicare HMO

## 2015-05-01 ENCOUNTER — Ambulatory Visit (INDEPENDENT_AMBULATORY_CARE_PROVIDER_SITE_OTHER)
Admission: RE | Admit: 2015-05-01 | Discharge: 2015-05-01 | Disposition: A | Discharge status: Home / Self Care | Payer: Medicare HMO | Source: Ambulatory Visit | Attending: Family Medicine | Admitting: Family Medicine

## 2015-05-01 ENCOUNTER — Encounter: Payer: Self-pay | Admitting: Family Medicine

## 2015-05-01 VITALS — BP 104/72 | HR 84 | Ht 65.0 in | Wt 153.0 lb

## 2015-05-01 DIAGNOSIS — M25512 Pain in left shoulder: Secondary | ICD-10-CM

## 2015-05-01 DIAGNOSIS — M5031 Other cervical disc degeneration,  high cervical region: Secondary | ICD-10-CM | POA: Diagnosis not present

## 2015-05-01 DIAGNOSIS — M12512 Traumatic arthropathy, left shoulder: Secondary | ICD-10-CM

## 2015-05-01 DIAGNOSIS — M5010 Cervical disc disorder with radiculopathy, unspecified cervical region: Secondary | ICD-10-CM | POA: Diagnosis not present

## 2015-05-01 DIAGNOSIS — M12812 Other specific arthropathies, not elsewhere classified, left shoulder: Secondary | ICD-10-CM | POA: Insufficient documentation

## 2015-05-01 DIAGNOSIS — M75102 Unspecified rotator cuff tear or rupture of left shoulder, not specified as traumatic: Secondary | ICD-10-CM

## 2015-05-01 MED ORDER — GABAPENTIN 100 MG PO CAPS: 200.0000 mg | ORAL_CAPSULE | Freq: Every day | ORAL | Status: DC

## 2015-05-01 NOTE — Assessment & Plan Note (Signed)
Patient does have pain mild rotator cuff tear but also has atrophy. Patient's atrophy may be secondary to neurologic component of the cervical spine. Patient does have fairly good strength on exam today of the rotator cuff. Patient has elected to decline injection today. Patient will start with home exercises and work with Product/process development scientist. Given topical anti-inflammatory to try. We discussed icing regimen. What activities to avoid. Follow up again in 3-4 weeks. If continuing have pain I would consider injection as well as formal physical therapy.

## 2015-05-01 NOTE — Progress Notes (Signed)
Pre visit review using our clinic review tool, if applicable. No additional management support is needed unless otherwise documented below in the visit note. 

## 2015-05-01 NOTE — Assessment & Plan Note (Signed)
Karen Bell the patient does have some cervical radiculopathy. X-rays ordered today for further evaluation. I do believe the patient will respond to gabapentin. We discussed postural control and patient work with Product/process development scientist to learn scapular strengthening and posture. Patient come back again in 3-4 weeks. If any worsening weakness we may need to consider advanced imaging.

## 2015-05-01 NOTE — Patient Instructions (Signed)
Good to see you Xray downstairs today  Ice 20 minutes 2 times daily. Usually after activity and before bed. Exercises 3 times a week.  Avoid overhead lifting pennsaid pinkie amount topically 2 times daily as needed.  Vitamin D 2000 IU daily  Turmeric 500 mg daily  See me again in 3 weeks and if not better we will try injection or physical therapy

## 2015-05-01 NOTE — Progress Notes (Signed)
[name] [place], [place] [address] Phone: [phone] Subjective:    I'm seeing this patient by the request  of:  MCGOWEN,PHILIP H, MD  CC: left shoulder pain  [id number] Karen Bell is a 70 y.o. female coming in with complaint of left shoulder pain. Been going on for proximal he 7 months. Patient has been trying to lift weights over the course last 2 months it seemed to exacerbate again more. States that it seems to be on the lateral aspect of the shoulder but can radiate down to her hand. Sometimes associated with some numbness. Denies any weakness. Can wake her up at night. Rates the severity of 7 out of 10. May be some association with some neck pain. Does not remember any specific injury. Numbness is intermittent and would deny any weakness once again.     Past Medical History  Diagnosis Date  . HTN (hypertension)   . Hyperlipidemia, mixed   . Family history of abdominal aortic aneurysm     pt has had AAA screening x 2, most recent was 02/2015.  Marland Kitchen Insomnia     maintenance problems; trazodone no help  . History of migraine     These resolved after menopause  . Nephrolithiasis 1994  . Diverticulosis   . Abnormal Heart Score CT 02/06/09     CT heart score 91   Past Surgical History  Procedure Laterality Date  . Kidney stone extraction  1992    Cystoscopy: fragmented ureteral stone.  No procedures for this since then.  . Cesarean section  x 2     [facility]  . Bunionectomy  2006    bilat  . Cyst excision      right index finger, base of fingernail  . Colonoscopy  2012    recall 10 yrs.  'tics.  . Carotid dopplers (screening for pad)  11/16/2008    "minor left carotid dz" per old records.  No hx of TIA/CVA.  Marland Kitchen Aortic ultrasound  02/13/13; 02/2015    no aneurism (screening for high risk)   Social History   Social History  . Marital Status: Married    Spouse Name: N/A  . Number of Children: N/A  . Years of Education:  N/A   Social History Main Topics  . Smoking status: Never Smoker   . Smokeless tobacco: None  . Alcohol Use: None  . Drug Use: None  . Sexual Activity: Not Asked   Other Topics Concern  . None   Social History Narrative   Married, 2 daughters.  Four older brothers, 3 of which have had abdominal aneurisms.   Relocated from [place] area 05/2013.   Occupation: retired Research officer, political party.   No tob, occ alcohol.     Allergies  Allergen Reactions  . Erythromycin Other (See Comments)    Abdominal pain  . Tetracyclines & Related Other (See Comments)    Joint pain   Family History  Problem Relation Age of Onset  . Cancer Mother     ? ovarian vs endometrial  . Hyperlipidemia Father   . Hypertension Father   . Bladder Cancer Brother   . Colon cancer      Negative history.  . Diabetes      Past medical history, social, surgical and family history all reviewed in electronic medical record.  No pertanent information unless stated regarding to the chief complaint.   Review of Systems: No headache, visual changes, nausea, vomiting,  diarrhea, constipation, dizziness, abdominal pain, skin rash, fevers, chills, night sweats, weight loss, swollen lymph nodes, body aches, joint swelling, muscle aches, chest pain, shortness of breath, mood changes.   Objective Blood pressure 104/72, pulse 84, height 5\' 5"  (1.651 m), weight 153 lb (69.4 kg), SpO2 93 %.  General: No apparent distress alert and oriented x3 mood and affect normal, dressed appropriately.  HEENT: Pupils equal, extraocular movements intact  Respiratory: Patient's speak in full sentences and does not appear short of breath  Cardiovascular: No lower extremity edema, non tender, no erythema  Skin: Warm dry intact with no signs of infection or rash on extremities or on axial skeleton.  Abdomen: Soft nontender  Neuro: Cranial nerves II through XII are intact, neurovascularly intact in all extremities with 2+ DTRs and 2+ pulses.    Lymph: No lymphadenopathy of posterior or anterior cervical chain or axillae bilaterally.  Gait normal with good balance and coordination.  MSK:  Non tender with full range of motion and good stability and symmetric strength and tone of  elbows, wrist, hip, knee and ankles bilaterally.  Neck: Inspection reveals a patient does have significant increase in lordosis of the neck No palpable stepoffs. positiveSpurling's maneuver with radicular symptoms in the C7 nerve distribution on the left side. Patient lacks last 5 of extension as well as the last 10 of side bending bilaterally. Lacks last 5 of rotation to the left Grip strength and sensation normal in bilateral hands Strength good C4 to T1 distribution No sensory change to C4 to T1 Negative Hoffman sign bilaterally Reflexes normal  Shoulder: left Inspection reveals no abnormalities, atrophy or asymmetry. Palpation is normal with no tenderness over AC joint or bicipital groove. ROM is full in all planes passively. Rotator cuff strength normal throughout. signs of impingement with positive Neer and Hawkin's tests, but negative empty can sign. Speeds and Yergason's tests normal. No labral pathology noted with negative Obrien's, negative clunk and good stability. Normal scapular function observed. No painful arc and no drop arm sign. No apprehension sign  MSK US performed of: left This study was ordered, performed, and interpreted by Charlann Boxer D.O.  Shoulder:   Supraspinatus: mild degenerative intersubstance tearing noted but no true retraction. Significant bursa enlargement noted Infraspinatus:  A complete tear with severe atrophy noted with significant retraction. Significant increase in Doppler flow Subscapularis: small degenerative changes noted. Atrophy noted Teres Minor:  Appears normal on long and transverse views. AC joint:  Moderate arthritis Glenohumeral Joint:  Appears normal without effusion. Glenoid Labrum:  Intact  without visualized tears. Biceps Tendon:  Appears normal on long and transverse views, no fraying of tendon, tendon located in intertubercular groove, no subluxation with shoulder internal or external rotation.  Impression: Subacromial bursitis, degenerative changes of the rotator cuff and complete tear of the infraspinatus tendon  Procedure note [id number]; 15 minutes spent for Therapeutic exercises as stated in above notes.  This included exercises focusing on stretching, strengthening, with significant focus on eccentric aspects.  Shoulder Exercises that included:  Basic scapular stabilization to include adduction and depression of scapula Scaption, focusing on proper movement and good control Internal and External rotation utilizing a theraband, with elbow tucked at side entire time Rows with theraband   Proper technique shown and discussed handout in great detail with ATC.  All questions were discussed and answered.     Impression and Recommendations:     This case required medical decision making of moderate complexity.      Note:  This dictation was prepared with Dragon dictation along with smaller phrase technology. Any transcriptional errors that result from this process are unintentional.

## 2015-05-10 ENCOUNTER — Encounter: Payer: Self-pay | Admitting: Family Medicine

## 2015-05-10 ENCOUNTER — Ambulatory Visit (INDEPENDENT_AMBULATORY_CARE_PROVIDER_SITE_OTHER): Payer: Medicare HMO | Admitting: Family Medicine

## 2015-05-10 VITALS — BP 128/81 | HR 76 | Temp 98.3°F | Resp 16 | Ht 64.5 in | Wt 152.8 lb

## 2015-05-10 DIAGNOSIS — I10 Essential (primary) hypertension: Secondary | ICD-10-CM

## 2015-05-10 DIAGNOSIS — Z131 Encounter for screening for diabetes mellitus: Secondary | ICD-10-CM

## 2015-05-10 DIAGNOSIS — Z0000 Encounter for general adult medical examination without abnormal findings: Secondary | ICD-10-CM | POA: Diagnosis not present

## 2015-05-10 NOTE — Progress Notes (Signed)
The patient is here for annual Medicare wellness examination and management of other chronic and acute problems. Other problems discussed today: BP well controlled, will do BMET at [facility] when she goes there in 2 wks for her fasting glucose.     AWV DATA The risk factors are reflected in the social history.  The roster of all physicians providing medical care to patient is listed in the Snapshot section of the chart.  Activities of daily living:  The patient is 100% independent in all ADLs: dressing, toileting, feeding as well as independent mobility.  Home safety : The patient has smoke detectors in the home. They wear seatbelts. No firearms at home ( firearms are present in the home, kept in a safe fashion). There is no violence in the home.   There is no risks for hepatitis, STDs or HIV. There is no history of blood transfusion. They have no travel history to infectious disease endemic areas of the world.  The patient has seen their dentist in the last six month. They have not seen their eye doctor in the last year.  She is considering changing to another eye MD. They deny any hearing difficulty and have not had audiologic testing in the last year.  They do not  have excessive sun exposure. Discussed the need for sun protection: hats, long sleeves and use of sunscreen if there is significant sun exposure.   Diet: the importance of a healthy diet is discussed. They do have a healthy diet.  The patient has a regular exercise program: goes to the [facility]: [address] 3 times per week and walks regularly .  The benefits of regular aerobic exercise were discussed.  Depression screen: there are no signs or vegative symptoms of depression- irritability, change in appetite, anhedonia, sadness/tearfullness.  Cognitive assessment: the patient manages all their financial and personal affairs and is actively engaged. They could relate day,date,year and events; recalled 3/3 objects at 3 minutes; performed  clock-face test normally.  Reviewed advanced directives with pt today.  She has this in place.  The following portions of the patient's history were reviewed and updated as appropriate: allergies, current medications, past family history, past medical history,  past surgical history, past social history  and problem list.  Vision, hearing, body mass index were assessed and reviewed. Body mass index is 25.82 kg/(m^2).   During the course of the visit the patient was educated and counseled about appropriate screening and preventive services including :  Annual wellness visit --doing today. diabetes screening--will go to [facility] for fasting glucose. colorectal cancer screening: due after 07/2016 recommended immunizations (influenza, pneumococcal, Hep B): pneumovax due; pt to arrange at her son's pharmacy. Bone mass measurement: she had this done 01/2015.  Discuss repeat 01/2017. Counseling to prevent tobacco use Depression screening: done today Glaucoma screening: to be done at eye MD Hepatitis C virus screening: pt not candidate HIV virus screening: pt declines Lung cancer screening: N/A Medical nutrition therapy: N/A Prostate cancer screening: N/A Screening mammography: Pt UTD Screening pap tests, pelvic exam, and clinical breast exam: UTD Ultrasound screening for AAA: pt has had this due to [place]  A written plan of action regarding the above screening and preventative services was given to the patient today.  An After Visit Summary was printed and given to the patient.  Signed:  Crissie Sickles, MD           05/10/2015

## 2015-05-10 NOTE — Progress Notes (Signed)
Pre visit review using our clinic review tool, if applicable. No additional management support is needed unless otherwise documented below in the visit note. 

## 2015-05-22 ENCOUNTER — Other Ambulatory Visit: Payer: Self-pay

## 2015-05-22 ENCOUNTER — Ambulatory Visit (INDEPENDENT_AMBULATORY_CARE_PROVIDER_SITE_OTHER): Payer: Medicare HMO | Admitting: Family Medicine

## 2015-05-22 ENCOUNTER — Encounter: Payer: Self-pay | Admitting: Family Medicine

## 2015-05-22 VITALS — BP 104/80 | HR 71 | Ht 64.5 in | Wt 153.0 lb

## 2015-05-22 DIAGNOSIS — M5010 Cervical disc disorder with radiculopathy, unspecified cervical region: Secondary | ICD-10-CM

## 2015-05-22 DIAGNOSIS — Z131 Encounter for screening for diabetes mellitus: Secondary | ICD-10-CM

## 2015-05-22 DIAGNOSIS — M12512 Traumatic arthropathy, left shoulder: Secondary | ICD-10-CM | POA: Diagnosis not present

## 2015-05-22 DIAGNOSIS — M12812 Other specific arthropathies, not elsewhere classified, left shoulder: Secondary | ICD-10-CM

## 2015-05-22 DIAGNOSIS — I10 Essential (primary) hypertension: Secondary | ICD-10-CM

## 2015-05-22 DIAGNOSIS — M75102 Unspecified rotator cuff tear or rupture of left shoulder, not specified as traumatic: Secondary | ICD-10-CM

## 2015-05-22 DIAGNOSIS — Z0000 Encounter for general adult medical examination without abnormal findings: Secondary | ICD-10-CM

## 2015-05-22 LAB — BASIC METABOLIC PANEL
BUN: 25 mg/dL (ref 7–25)
CHLORIDE: 102 mmol/L (ref 98–110)
CO2: 25 mmol/L (ref 20–31)
Calcium: 10.2 mg/dL (ref 8.6–10.4)
Creat: 0.82 mg/dL (ref 0.50–0.99)
GLUCOSE: 84 mg/dL (ref 65–99)
POTASSIUM: 4.6 mmol/L (ref 3.5–5.3)
SODIUM: 140 mmol/L (ref 135–146)

## 2015-05-22 NOTE — Progress Notes (Signed)
[facility] [place] [place], [place] [address] Phone: [phone] Subjective:    I'm seeing this patient by the request  of:  MCGOWEN,PHILIP H, MD  CC: left shoulder pain f/u  [id number] Karen Bell is a 70 y.o. female coming in with complaint of left shoulder pain. Patient was found to have degenerative disc disease of the cervical spine as well as a complete tear of the infraspinatus tendon. Patient has been doing home exercises and taking gabapentin and over-the-counter medications religiously. States that she is feeling approximately 60% better. Notices some mild increase in strength as well. Overall patient is fairly happy with the results.    Past Medical History  Diagnosis Date  . HTN (hypertension)   . Hyperlipidemia, mixed   . Family history of abdominal aortic aneurysm     pt has had AAA screening x 2, most recent was 02/2015.  Marland Kitchen Insomnia     maintenance problems; trazodone no help  . History of migraine     These resolved after menopause  . Nephrolithiasis 1994  . Diverticulosis   . Abnormal Heart Score CT 02/06/09     CT heart score 91  . Left rotator cuff tear arthropathy 04/2015    Dr. Charlann Boxer  . Cervical disc disorder with radiculopathy    "    "       "      "   Past Surgical History  Procedure Laterality Date  . Kidney stone extraction  1992    Cystoscopy: fragmented ureteral stone.  No procedures for this since then.  . Cesarean section  x 2     [place]  . Bunionectomy  2006    bilat  . Cyst excision      right index finger, base of fingernail  . Colonoscopy  07/2006    recall 10 yrs.  'tics.  (Dr. Domenica Fail in [place], [place])  . Carotid dopplers (screening for pad)  11/16/2008    "minor left carotid dz" per old records.  No hx of TIA/CVA.  Marland Kitchen Aortic ultrasound  02/13/13; 02/2015    no aneurism (screening for high risk)   Social History   Social History  . Marital Status: Married    Spouse Name: N/A  . Number  of Children: N/A  . Years of Education: N/A   Social History Main Topics  . Smoking status: Never Smoker   . Smokeless tobacco: None  . Alcohol Use: None  . Drug Use: None  . Sexual Activity: Not Asked   Other Topics Concern  . None   Social History Narrative   Married, 2 daughters.  Four older brothers, 3 of which have had abdominal aneurisms.   Relocated from [place] area 05/2013.   Occupation: retired Research officer, political party.   No tob, occ alcohol.     Allergies  Allergen Reactions  . Erythromycin Other (See Comments)    Abdominal pain  . Tetracyclines & Related Other (See Comments)    Joint pain   Family History  Problem Relation Age of Onset  . Cancer Mother     ? ovarian vs endometrial  . Hyperlipidemia Father   . Hypertension Father   . Bladder Cancer Brother   . Colon cancer      Negative history.  . Diabetes      Past medical history, social, surgical and family history all reviewed in electronic medical record.  No pertanent information unless stated regarding to  the chief complaint.   Review of Systems: No headache, visual changes, nausea, vomiting, diarrhea, constipation, dizziness, abdominal pain, skin rash, fevers, chills, night sweats, weight loss, swollen lymph nodes, body aches, joint swelling, muscle aches, chest pain, shortness of breath, mood changes.   Objective Blood pressure 104/80, pulse 71, height 5' 4.5" (1.638 m), weight 153 lb (69.4 kg), SpO2 93 %.  General: No apparent distress alert and oriented x3 mood and affect normal, dressed appropriately.  HEENT: Pupils equal, extraocular movements intact  Respiratory: Patient's speak in full sentences and does not appear short of breath  Cardiovascular: No lower extremity edema, non tender, no erythema  Skin: Warm dry intact with no signs of infection or rash on extremities or on axial skeleton.  Abdomen: Soft nontender  Neuro: Cranial nerves II through XII are intact, neurovascularly intact in all  extremities with 2+ DTRs and 2+ pulses.  Lymph: No lymphadenopathy of posterior or anterior cervical chain or axillae bilaterally.  Gait normal with good balance and coordination.  MSK:  Non tender with full range of motion and good stability and symmetric strength and tone of  elbows, wrist, hip, knee and ankles bilaterally.  Neck: Inspection reveals a patient does have significant increase in lordosis of the neck No palpable stepoffs. Continue mild positive Spurling's with C7 distribution on the left side Mild improvement in side bending but still lacks last 5 in rotation and extension Grip strength and sensation normal in bilateral hands Strength good C4 to T1 distribution No sensory change to C4 to T1 Negative Hoffman sign bilaterally Reflexes normal  Shoulder: left Inspection reveals no abnormalities, atrophy or asymmetry. Palpation is normal with no tenderness over AC joint or bicipital groove. ROM is full in all planes passively. Rotator cuff strength normal throughout. Still some impingement noted Speeds and Yergason's tests normal. No labral pathology noted with negative Obrien's, negative clunk and good stability. Normal scapular function observed. No painful arc and no drop arm sign. No apprehension sign    Impression and Recommendations:     This case required medical decision making of moderate complexity.      Note: This dictation was prepared with Dragon dictation along with smaller phrase technology. Any transcriptional errors that result from this process are unintentional.

## 2015-05-22 NOTE — Progress Notes (Signed)
Pre visit review using our clinic review tool, if applicable. No additional management support is needed unless otherwise documented below in the visit note. 

## 2015-05-22 NOTE — Patient Instructions (Signed)
You are doing great  Continue the gabapentin and all the vitamins for now.  Keep up with the exercises I want you to increase up to 15 reps with each set with 10oz can  Once at 15 resp then decrease the reps to 7 and increase weight to 20oz.  Continue icing at night See me again in 6 weeks and you should be near 90% at that time.

## 2015-05-22 NOTE — Assessment & Plan Note (Signed)
Patient has made some improvement in the strength at this time. Patient to continue the exercises 2-3 times a week. No significant change in management. Follow-up again in 6 weeks. Consider possible repeating ultrasound at follow-up.

## 2015-05-22 NOTE — Assessment & Plan Note (Signed)
Improvement at this time. Encourage patient to continue the gabapentin at the low dose. We discussed icing regimen. Discussed home exercises and continuing to do this. Discussed posture. Follow-up again in 6 weeks. We'll call if any worsening symptoms and will be seen sooner.

## 2015-06-07 DIAGNOSIS — Z124 Encounter for screening for malignant neoplasm of cervix: Secondary | ICD-10-CM | POA: Diagnosis not present

## 2015-06-15 DIAGNOSIS — Z23 Encounter for immunization: Secondary | ICD-10-CM | POA: Diagnosis not present

## 2015-07-04 DIAGNOSIS — Z1231 Encounter for screening mammogram for malignant neoplasm of breast: Secondary | ICD-10-CM | POA: Diagnosis not present

## 2015-07-06 ENCOUNTER — Encounter: Payer: Self-pay | Admitting: Family Medicine

## 2015-07-06 ENCOUNTER — Ambulatory Visit (INDEPENDENT_AMBULATORY_CARE_PROVIDER_SITE_OTHER): Payer: Medicare HMO | Admitting: Family Medicine

## 2015-07-06 VITALS — BP 122/76 | HR 78 | Ht 64.5 in | Wt 153.0 lb

## 2015-07-06 DIAGNOSIS — M5010 Cervical disc disorder with radiculopathy, unspecified cervical region: Secondary | ICD-10-CM

## 2015-07-06 DIAGNOSIS — M12812 Other specific arthropathies, not elsewhere classified, left shoulder: Secondary | ICD-10-CM

## 2015-07-06 DIAGNOSIS — M12512 Traumatic arthropathy, left shoulder: Secondary | ICD-10-CM

## 2015-07-06 DIAGNOSIS — M75102 Unspecified rotator cuff tear or rupture of left shoulder, not specified as traumatic: Secondary | ICD-10-CM

## 2015-07-06 NOTE — Progress Notes (Signed)
[facility] [address] [place], [place] [address] Phone: [phone] Subjective:    I'm seeing this patient by the request  of:  MCGOWEN,PHILIP H, MD  CC: left shoulder pain f/u  [id number] [name] is a 70 y.o. female coming in with complaint of left shoulder pain. Patient was found to have degenerative disc disease of the cervical spine as well as a complete tear of the infraspinatus tendon. Patient is been doing the home exercises. States that she is about 85-90% better. States that the gabapentin has helped with the radicular symptoms. Staying very active. Doing the exercises 3 times a week were no significant pain. Has increased her weights to 2-1/2 pounds. Feels like she is making progress. No nighttime awakening.    Past Medical History  Diagnosis Date  . HTN (hypertension)   . Hyperlipidemia, mixed   . Family history of abdominal aortic aneurysm     pt has had AAA screening x 2, most recent was 02/2015.  Marland Kitchen Insomnia     maintenance problems; trazodone no help  . History of migraine     These resolved after menopause  . Nephrolithiasis 1994  . Diverticulosis   . Abnormal Heart Score CT 02/06/09     CT heart score 91  . Left rotator cuff tear arthropathy 04/2015    Dr. Charlann Boxer  . Cervical disc disorder with radiculopathy    "    "       "      "   Past Surgical History  Procedure Laterality Date  . Kidney stone extraction  1992    Cystoscopy: fragmented ureteral stone.  No procedures for this since then.  . Cesarean section  x 2     [place]  . Bunionectomy  2006    bilat  . Cyst excision      right index finger, base of fingernail  . Colonoscopy  07/2006    recall 10 yrs.  'tics.  (Dr. Domenica Fail in [place], [place])  . Carotid dopplers (screening for pad)  11/16/2008    "minor left carotid dz" per old records.  No hx of TIA/CVA.  Marland Kitchen Aortic ultrasound  02/13/13; 02/2015    no aneurism (screening for high risk)   Social History    Social History  . Marital Status: Married    Spouse Name: N/A  . Number of Children: N/A  . Years of Education: N/A   Social History Main Topics  . Smoking status: Never Smoker   . Smokeless tobacco: None  . Alcohol Use: None  . Drug Use: None  . Sexual Activity: Not Asked   Other Topics Concern  . None   Social History Narrative   Married, 2 daughters.  Four older brothers, 3 of which have had abdominal aneurisms.   Relocated from [place] area 05/2013.   Occupation: retired Research officer, political party.   No tob, occ alcohol.     Allergies  Allergen Reactions  . Erythromycin Other (See Comments)    Abdominal pain  . Tetracyclines & Related Other (See Comments)    Joint pain   Family History  Problem Relation Age of Onset  . Cancer Mother     ? ovarian vs endometrial  . Hyperlipidemia Father   . Hypertension Father   . Bladder Cancer Brother   . Colon cancer      Negative history.  . Diabetes      Past medical history, social, surgical  and family history all reviewed in electronic medical record.  No pertanent information unless stated regarding to the chief complaint.   Review of Systems: No headache, visual changes, nausea, vomiting, diarrhea, constipation, dizziness, abdominal pain, skin rash, fevers, chills, night sweats, weight loss, swollen lymph nodes, body aches, joint swelling, muscle aches, chest pain, shortness of breath, mood changes.   Objective Blood pressure 122/76, pulse 78, height 5' 4.5" (1.638 m), weight 153 lb (69.4 kg), SpO2 94 %.  General: No apparent distress alert and oriented x3 mood and affect normal, dressed appropriately.  HEENT: Pupils equal, extraocular movements intact  Respiratory: Patient's speak in full sentences and does not appear short of breath  Cardiovascular: No lower extremity edema, non tender, no erythema  Skin: Warm dry intact with no signs of infection or rash on extremities or on axial skeleton.  Abdomen: Soft nontender   Neuro: Cranial nerves II through XII are intact, neurovascularly intact in all extremities with 2+ DTRs and 2+ pulses.  Lymph: No lymphadenopathy of posterior or anterior cervical chain or axillae bilaterally.  Gait normal with good balance and coordination.  MSK:  Non tender with full range of motion and good stability and symmetric strength and tone of  elbows, wrist, hip, knee and ankles bilaterally.  Neck: Inspection reveals a patient does have significant increase in lordosis of the neck No palpable stepoffs. Negative Spurling's Mild improvement in side bending but still lacks last 5 in rotation and extension Grip strength and sensation normal in bilateral hands Strength good C4 to T1 distribution No sensory change to C4 to T1 Negative Hoffman sign bilaterally Reflexes normal  Shoulder: left Inspection reveals no abnormalities, atrophy or asymmetry. Palpation is normal with no tenderness over AC joint or bicipital groove. Does have some limitation in range of motion. Lacks the last 5 of internal range of motion in patient does have external range of motion of only 5. Mild crepitus noted. Rotator cuff strength normal throughout. Mild impingement noted. Speeds and Yergason's tests normal. No labral pathology noted with negative Obrien's, negative clunk and good stability. Normal scapular function observed. No painful arc and no drop arm sign. No apprehension sign    Impression and Recommendations:     This case required medical decision making of moderate complexity.      Note: This dictation was prepared with Dragon dictation along with smaller phrase technology. Any transcriptional errors that result from this process are unintentional.

## 2015-07-06 NOTE — Assessment & Plan Note (Addendum)
Follow-up as needed. If worsening symptoms injection could be beneficial

## 2015-07-06 NOTE — Assessment & Plan Note (Signed)
Has made improvement overall. Encourage patient to continue to work on posture as well as continuing vitamin D supplementation. Patient will continue on the gabapentin secondary to the radicular symptoms. If no side effects she can continue this and doubly. Patient will follow-up with me more on an as-needed basis.

## 2015-07-06 NOTE — Progress Notes (Signed)
Pre visit review using our clinic review tool, if applicable. No additional management support is needed unless otherwise documented below in the visit note. 

## 2015-07-12 DIAGNOSIS — H2513 Age-related nuclear cataract, bilateral: Secondary | ICD-10-CM | POA: Diagnosis not present

## 2015-09-23 DIAGNOSIS — Z23 Encounter for immunization: Secondary | ICD-10-CM | POA: Diagnosis not present

## 2015-11-17 DIAGNOSIS — R3989 Other symptoms and signs involving the genitourinary system: Secondary | ICD-10-CM | POA: Diagnosis not present

## 2015-12-04 ENCOUNTER — Ambulatory Visit (INDEPENDENT_AMBULATORY_CARE_PROVIDER_SITE_OTHER): Payer: Medicare HMO | Admitting: Family Medicine

## 2015-12-04 ENCOUNTER — Telehealth: Payer: Self-pay | Admitting: Family Medicine

## 2015-12-04 ENCOUNTER — Encounter: Payer: Self-pay | Admitting: Family Medicine

## 2015-12-04 VITALS — BP 132/88 | HR 75 | Temp 98.1°F | Resp 16 | Ht 65.0 in | Wt 154.8 lb

## 2015-12-04 DIAGNOSIS — Z0000 Encounter for general adult medical examination without abnormal findings: Secondary | ICD-10-CM | POA: Diagnosis not present

## 2015-12-04 DIAGNOSIS — R350 Frequency of micturition: Secondary | ICD-10-CM | POA: Diagnosis not present

## 2015-12-04 DIAGNOSIS — Z1159 Encounter for screening for other viral diseases: Secondary | ICD-10-CM

## 2015-12-04 DIAGNOSIS — R946 Abnormal results of thyroid function studies: Secondary | ICD-10-CM | POA: Diagnosis not present

## 2015-12-04 DIAGNOSIS — E782 Mixed hyperlipidemia: Secondary | ICD-10-CM | POA: Diagnosis not present

## 2015-12-04 LAB — COMPREHENSIVE METABOLIC PANEL
ALK PHOS: 44 U/L (ref 33–130)
ALT: 29 U/L (ref 6–29)
AST: 26 U/L (ref 10–35)
Albumin: 4.7 g/dL (ref 3.6–5.1)
BILIRUBIN TOTAL: 0.4 mg/dL (ref 0.2–1.2)
BUN: 22 mg/dL (ref 7–25)
CO2: 28 mmol/L (ref 20–31)
CREATININE: 0.88 mg/dL (ref 0.60–0.93)
Calcium: 10.1 mg/dL (ref 8.6–10.4)
Chloride: 100 mmol/L (ref 98–110)
Glucose, Bld: 91 mg/dL (ref 65–99)
POTASSIUM: 4.7 mmol/L (ref 3.5–5.3)
Sodium: 138 mmol/L (ref 135–146)
TOTAL PROTEIN: 7.8 g/dL (ref 6.1–8.1)

## 2015-12-04 LAB — POCT URINALYSIS DIPSTICK
BILIRUBIN UA: NEGATIVE
Glucose, UA: NEGATIVE
KETONES UA: NEGATIVE
LEUKOCYTES UA: NEGATIVE
Nitrite, UA: NEGATIVE
PH UA: 7
PROTEIN UA: NEGATIVE
RBC UA: NEGATIVE
SPEC GRAV UA: 1.02
Urobilinogen, UA: 0.2

## 2015-12-04 LAB — LIPID PANEL
Cholesterol: 163 mg/dL (ref <200)
HDL: 42 mg/dL — ABNORMAL LOW (ref >50)
LDL CALC: 88 mg/dL (ref <100)
TRIGLYCERIDES: 166 mg/dL — AB (ref <150)
Total CHOL/HDL Ratio: 3.9 Ratio (ref <5.0)
VLDL: 33 mg/dL — ABNORMAL HIGH (ref <30)

## 2015-12-04 LAB — TSH: TSH: 3.55 mIU/L

## 2015-12-04 MED ORDER — LISINOPRIL 5 MG PO TABS: 5.0000 mg | ORAL_TABLET | Freq: Every day | ORAL | 3 refills | Status: DC

## 2015-12-04 MED ORDER — SIMVASTATIN 40 MG PO TABS: 40.0000 mg | ORAL_TABLET | Freq: Every day | ORAL | 3 refills | Status: DC

## 2015-12-04 MED ORDER — SULFAMETHOXAZOLE-TRIMETHOPRIM 800-160 MG PO TABS: 1.0000 | ORAL_TABLET | Freq: Two times a day (BID) | ORAL | 0 refills | Status: DC

## 2015-12-04 MED ORDER — FENOFIBRATE 54 MG PO TABS: 54.0000 mg | ORAL_TABLET | Freq: Every day | ORAL | 3 refills | Status: DC

## 2015-12-04 NOTE — Telephone Encounter (Signed)
LOV: 12/04/15 NOV: None  RF request for lisinopril Last written: 11/13/14 #90 w/ 3Rf  RF request for fenofibrate Last written: 11/13/14 #90 w/ 3RF  RF request for simvastatin Last written: 11/13/14 #90 w/ 3RF

## 2015-12-04 NOTE — Telephone Encounter (Signed)
Lisinopril, fenofibrate, simvastatin  Rx. Patient is requesting a hard copy

## 2015-12-04 NOTE — Telephone Encounter (Signed)
Rx's printed/signed and put up front for p/u. Pt advised and voiced understanding.

## 2015-12-04 NOTE — Progress Notes (Signed)
Office Note 12/04/2015  CC:  Chief Complaint  Patient presents with  . Annual Exam    Pt is fasting.     HPI:  Karen Bell is a 70 y.o. White female who is here for annual health maintenance exam. Gets followed by her GYN for cervical ca and breast ca screening.  Had a UTI in [place] a couple weeks ago: dysuria, gross hematuria, frequency.  Got treated with keflex x 5d. Symptoms resolved until last few days the frequency has returned.  Some urgency.  No dysuria.   Past Medical History:  Diagnosis Date  . Abnormal Heart Score CT 02/06/09    CT heart score 91  . Cervical disc disorder with radiculopathy    "   "       "      "  . Diverticulosis   . Family history of abdominal aortic aneurysm    pt has had AAA screening x 2, most recent was 02/2015.  Marland Kitchen History of migraine    These resolved after menopause  . HTN (hypertension)   . Hyperlipidemia, mixed   . Insomnia    maintenance problems; trazodone no help  . Left rotator cuff tear arthropathy 04/2015   Dr. Charlann Boxer  . Nephrolithiasis 1994    Past Surgical History:  Procedure Laterality Date  . Aortic ultrasound  02/13/13; 02/2015   no aneurism (screening for high risk)  . BUNIONECTOMY  2006   bilat  . carotid dopplers (screening for PAD)  11/16/2008   "minor left carotid dz" per old records.  No hx of TIA/CVA.  Marland Kitchen CESAREAN SECTION  x 2    [place]  . COLONOSCOPY  07/2006   recall 10 yrs.  'tics.  (Dr. Domenica Fail in [place], [place])  . CYST EXCISION     right index finger, base of fingernail  . kidney stone extraction  1992   Cystoscopy: fragmented ureteral stone.  No procedures for this since then.    Family History  Problem Relation Age of Onset  . Cancer Mother     ? ovarian vs endometrial  . Hyperlipidemia Father   . Hypertension Father   . Bladder Cancer Brother   . Colon cancer      Negative history.  . Diabetes      Social History   Social History  . Marital status: Married    Spouse name: N/A   . Number of children: N/A  . Years of education: N/A   Occupational History  . Not on file.   Social History Main Topics  . Smoking status: Never Smoker  . Smokeless tobacco: Not on file  . Alcohol use Not on file  . Drug use: Unknown  . Sexual activity: Not on file   Other Topics Concern  . Not on file   Social History Narrative   Married, 2 daughters.  Four older brothers, 3 of which have had abdominal aneurisms.   Relocated from [place] area 05/2013.   Occupation: retired Research officer, political party.   No tob, occ alcohol.      Outpatient Medications Prior to Visit  Medication Sig Dispense Refill  . aspirin 81 MG tablet Take 81 mg by mouth daily.    . calcium citrate-vitamin D (CITRACAL+D) 315-200 MG-UNIT per tablet Take 1 tablet by mouth daily.    . Cholecalciferol (VITAMIN D) 2000 units tablet Take 2,000 Units by mouth daily.    . fenofibrate 54 MG tablet Take 1 tablet (54 mg total)  by mouth daily. 90 tablet 3  . lisinopril (PRINIVIL,ZESTRIL) 5 MG tablet Take 1 tablet (5 mg total) by mouth daily. 90 tablet 3  . Lutein-Zeaxanthin 25-5 MG CAPS Take 1 capsule by mouth daily.    . Multiple Vitamins-Minerals (CENTRUM SILVER PO) Take by mouth.    . Omega-3 Fatty Acids (FISH OIL) 1200 MG CAPS Take 1,200 mg by mouth.    . simvastatin (ZOCOR) 40 MG tablet Take 1 tablet (40 mg total) by mouth daily. 90 tablet 3  . cetirizine (ZYRTEC) 10 MG tablet Take 10 mg by mouth daily.    Marland Kitchen gabapentin (NEURONTIN) 100 MG capsule Take 2 capsules (200 mg total) by mouth at bedtime. (Patient not taking: Reported on 12/04/2015) 60 capsule 3   No facility-administered medications prior to visit.     Allergies  Allergen Reactions  . Erythromycin Other (See Comments)    Abdominal pain  . Tetracyclines & Related Other (See Comments)    Joint pain    ROS Review of Systems  Constitutional: Negative for appetite change, chills, fatigue and fever.  HENT: Negative for congestion, dental problem, ear pain  and sore throat.   Eyes: Negative for discharge, redness and visual disturbance.  Respiratory: Negative for cough, chest tightness, shortness of breath and wheezing.   Cardiovascular: Negative for chest pain, palpitations and leg swelling.  Gastrointestinal: Negative for abdominal pain, blood in stool, diarrhea, nausea and vomiting.  Genitourinary: Positive for frequency. Negative for difficulty urinating, dysuria, flank pain, hematuria and urgency.       See HPI  Musculoskeletal: Negative for arthralgias, back pain, joint swelling, myalgias and neck stiffness.  Skin: Negative for pallor and rash.  Neurological: Negative for dizziness, speech difficulty, weakness and headaches.  Hematological: Negative for adenopathy. Does not bruise/bleed easily.  Psychiatric/Behavioral: Negative for confusion and sleep disturbance. The patient is not nervous/anxious.     PE; Blood pressure 132/88, pulse 75, temperature 98.1 F (36.7 C), temperature source Oral, resp. rate 16, height 5\' 5"  (1.651 m), weight 154 lb 12 oz (70.2 kg), SpO2 97 %.  Pt examined with Sharen Hones, CMA, as chaperone.  Gen: Alert, well appearing.  Patient is oriented to person, place, time, and situation. AFFECT: pleasant, lucid thought and speech. ENT: Ears: EACs clear, normal epithelium.  TMs with good light reflex and landmarks bilaterally.  Eyes: no injection, icteris, swelling, or exudate.  EOMI, PERRLA. Nose: no drainage or turbinate edema/swelling.  No injection or focal lesion.  Mouth: lips without lesion/swelling.  Oral mucosa pink and moist.  Dentition intact and without obvious caries or gingival swelling.  Oropharynx without erythema, exudate, or swelling.  Neck: supple/nontender.  No LAD, mass, or TM.  Carotid pulses 2+ bilaterally, without bruits. CV: RRR, no m/r/g.   LUNGS: CTA bilat, nonlabored resps, good aeration in all lung fields. ABD: soft, NT, ND, BS normal.  No hepatospenomegaly or mass.  No bruits. EXT: no  clubbing, cyanosis, or edema.  Musculoskeletal: no joint swelling, erythema, warmth, or tenderness.  ROM of all joints intact. Skin - no sores or suspicious lesions or rashes or color changes  Pertinent labs:  Lab Results  Component Value Date   TSH 2.851 11/13/2014   Lab Results  Component Value Date   WBC 6.1 12/20/2013   HGB 15.1 (H) 12/20/2013   HCT 46.0 12/20/2013   MCV 92.0 12/20/2013   PLT 274.0 12/20/2013   Lab Results  Component Value Date   CREATININE 0.82 05/22/2015   BUN 25 05/22/2015  NA 140 05/22/2015   K 4.6 05/22/2015   CL 102 05/22/2015   CO2 25 05/22/2015   Lab Results  Component Value Date   ALT 20 11/13/2014   AST 22 11/13/2014   ALKPHOS 46 11/13/2014   BILITOT 0.4 11/13/2014   Lab Results  Component Value Date   CHOL 168 11/13/2014   Lab Results  Component Value Date   HDL 41 (L) 11/13/2014   Lab Results  Component Value Date   LDLCALC 92 11/13/2014   Lab Results  Component Value Date   TRIG 174 (H) 11/13/2014   Lab Results  Component Value Date   CHOLHDL 4.1 11/13/2014   CC UA today: all normal.  ASSESSMENT AND PLAN:   1) Health maintenance exam: Reviewed age and gender appropriate health maintenance issues (prudent diet, regular exercise, health risks of tobacco and excessive alcohol, use of seatbelts, fire alarms in home, use of sunscreen).  Also reviewed age and gender appropriate health screening as well as vaccine recommendations. Vaccines UTD. Fasting CMET, lipids, TSH, and Hep C screening done today. Cerv ca and breast ca screening UTD via her GYN. Colon cancer screening: next colonoscopy due after 07/2016.  2) Her home bp monitoring has shown a slight up-trend over the last couple months but it has been mild and inconsistent and we decided to just continue with monitoring for now---no med changes.  3) UTI: send urine for c/s.  Start bactrim DS 1 tab bid x 3d.  An After Visit Summary was printed and given to the  patient.  FOLLOW UP:  Return in about 6 months (around 06/02/2016) for routine chronic illness f/u.  Signed:  Crissie Sickles, MD           12/04/2015

## 2015-12-04 NOTE — Progress Notes (Signed)
Pre visit review using our clinic review tool, if applicable. No additional management support is needed unless otherwise documented below in the visit note. 

## 2015-12-05 LAB — HEPATITIS C ANTIBODY: HCV Ab: NEGATIVE

## 2015-12-07 LAB — URINE CULTURE

## 2016-02-28 LAB — COLOGUARD: COLOGUARD: NEGATIVE

## 2016-06-10 ENCOUNTER — Encounter: Payer: Self-pay | Admitting: Family Medicine

## 2016-06-10 ENCOUNTER — Ambulatory Visit (INDEPENDENT_AMBULATORY_CARE_PROVIDER_SITE_OTHER): Payer: Medicare HMO | Admitting: Family Medicine

## 2016-06-10 VITALS — BP 130/81 | HR 75 | Temp 98.0°F | Resp 16 | Ht 65.0 in | Wt 154.0 lb

## 2016-06-10 DIAGNOSIS — I10 Essential (primary) hypertension: Secondary | ICD-10-CM

## 2016-06-10 DIAGNOSIS — M79675 Pain in left toe(s): Secondary | ICD-10-CM | POA: Diagnosis not present

## 2016-06-10 DIAGNOSIS — Z1211 Encounter for screening for malignant neoplasm of colon: Secondary | ICD-10-CM | POA: Diagnosis not present

## 2016-06-10 DIAGNOSIS — G8929 Other chronic pain: Secondary | ICD-10-CM | POA: Diagnosis not present

## 2016-06-10 DIAGNOSIS — Z0000 Encounter for general adult medical examination without abnormal findings: Secondary | ICD-10-CM

## 2016-06-10 DIAGNOSIS — M2041 Other hammer toe(s) (acquired), right foot: Secondary | ICD-10-CM

## 2016-06-10 DIAGNOSIS — E7800 Pure hypercholesterolemia, unspecified: Secondary | ICD-10-CM | POA: Diagnosis not present

## 2016-06-10 NOTE — Progress Notes (Signed)
OFFICE VISIT  06/10/2016   CC:  Chief Complaint  Patient presents with  . Follow-up    RCI, pt is not fasting.    HPI:    Patient is a 71 y.o.  female who presents for 6 mo f/u HTN, mixed hyperlipidemia.  Home bp monitoring: avg 120s/70s, HR 70s. HLD; compliant with statin and fibrate, tolerating these meds well.  Eating a decent diet most of the time. Some walking for exercise.  New complaint: Has prob with overlapping great toe onto 2nd toe on R foot.  It hurts some over 2nd toe and feels uncomfortable sometimes.  Hx of lots of bruising at distal aspect --end of 2nd toe L foot.  Got bunion surgery bilat about 10 yrs ago.  These changes have been gradually coming on since the surgery. Bothers her a lot when walking for exercise.  She requests referral to a podiatrist.  Past Medical History:  Diagnosis Date  . Abnormal Heart Score CT 02/06/09    CT heart score 91  . Cervical disc disorder with radiculopathy    "   "       "      "  . Diverticulosis   . Family history of abdominal aortic aneurysm    pt has had AAA screening x 2, most recent was 02/2015.  Marland Kitchen History of migraine    These resolved after menopause  . HTN (hypertension)   . Hyperlipidemia, mixed   . Insomnia    maintenance problems; trazodone no help  . Left rotator cuff tear arthropathy 04/2015   Dr. Charlann Boxer  . Nephrolithiasis 1994    Past Surgical History:  Procedure Laterality Date  . Aortic ultrasound  02/13/13; 02/2015   no aneurism (screening for high risk)  . BUNIONECTOMY  2006   bilat  . carotid dopplers (screening for PAD)  11/16/2008   "minor left carotid dz" per old records.  No hx of TIA/CVA.  Marland Kitchen CESAREAN SECTION  x 2    [place]  . COLONOSCOPY  07/2006   recall 10 yrs.  'tics.  (Dr. Domenica Fail in [place], [place])  . CYST EXCISION     right index finger, base of fingernail  . kidney stone extraction  1992   Cystoscopy: fragmented ureteral stone.  No procedures for this since then.    Outpatient  Medications Prior to Visit  Medication Sig Dispense Refill  . aspirin 81 MG tablet Take 81 mg by mouth daily.    . calcium citrate-vitamin D (CITRACAL+D) 315-200 MG-UNIT per tablet Take 1 tablet by mouth daily.    . Cholecalciferol (VITAMIN D) 2000 units tablet Take 2,000 Units by mouth daily.    . fenofibrate 54 MG tablet Take 1 tablet (54 mg total) by mouth daily. 90 tablet 3  . lisinopril (PRINIVIL,ZESTRIL) 5 MG tablet Take 1 tablet (5 mg total) by mouth daily. 90 tablet 3  . Lutein-Zeaxanthin 25-5 MG CAPS Take 1 capsule by mouth daily.    . Multiple Vitamins-Minerals (CENTRUM SILVER PO) Take by mouth.    . Omega-3 Fatty Acids (FISH OIL) 1200 MG CAPS Take 1,200 mg by mouth.    . simvastatin (ZOCOR) 40 MG tablet Take 1 tablet (40 mg total) by mouth daily. 90 tablet 3  . TURMERIC PO Take 400 mg by mouth daily.    Marland Kitchen sulfamethoxazole-trimethoprim (BACTRIM DS,SEPTRA DS) 800-160 MG tablet Take 1 tablet by mouth 2 (two) times daily. (Patient not taking: Reported on 06/10/2016) 6 tablet 0  No facility-administered medications prior to visit.     Allergies  Allergen Reactions  . Erythromycin Other (See Comments)    Abdominal pain  . Tetracyclines & Related Other (See Comments)    Joint pain    ROS As per HPI  PE: Blood pressure 130/81, pulse 75, temperature 98 F (36.7 C), temperature source Oral, resp. rate 16, height 5\' 5"  (1.651 m), weight 154 lb (69.9 kg), SpO2 98 %. Gen: Alert, well appearing.  Patient is oriented to person, place, time, and situation. AFFECT: pleasant, lucid thought and speech. CV: RRR, no m/r/g.   LUNGS: CTA bilat, nonlabored resps, good aeration in all lung fields. EXT: no clubbing, cyanosis, or edema.  FEET: very mild overlapping of great toe over 2nd toe on R foot.  No skin breakdown.  Mild hammertoe deformity of 2nd toe on this foot, with small callous over PIP joint. Left foot: 2nd toe notably longer than great toe, mild ecchymoses on tip of 2nd toe.  No  skin breakdown.  LABS:  Lab Results  Component Value Date   TSH 3.55 12/04/2015   Lab Results  Component Value Date   WBC 6.1 12/20/2013   HGB 15.1 (H) 12/20/2013   HCT 46.0 12/20/2013   MCV 92.0 12/20/2013   PLT 274.0 12/20/2013   Lab Results  Component Value Date   CREATININE 0.88 12/04/2015   BUN 22 12/04/2015   NA 138 12/04/2015   K 4.7 12/04/2015   CL 100 12/04/2015   CO2 28 12/04/2015   Lab Results  Component Value Date   ALT 29 12/04/2015   AST 26 12/04/2015   ALKPHOS 44 12/04/2015   BILITOT 0.4 12/04/2015   Lab Results  Component Value Date   CHOL 163 12/04/2015   Lab Results  Component Value Date   HDL 42 (L) 12/04/2015   Lab Results  Component Value Date   LDLCALC 88 12/04/2015   Lab Results  Component Value Date   TRIG 166 (H) 12/04/2015   Lab Results  Component Value Date   CHOLHDL 3.9 12/04/2015   IMPRESSION AND PLAN:  1) HTN: The current medical regimen is effective;  continue present plan and medications. Lytes/cr excellent at last f/u 11/2015. Plan repeat in 6 mo.  2) Mixed hyperlipidemia: Tolerating statin and fibrate.  LFTs and lipid panel excellent 11/2015. Plan repeat in 6 mo.  3) Feet/toe deformities: some post-surgical toe position changes that bother pt, esp when she needs to walk excessively/exercise.  Referral to podiatrist ordered today as per pt request.   4) Preventative health care: Repeat screening colonoscopy due 07/2016 but pt has never had any polyps and she declines further colonoscopy at this time. She was agreeable to cologuard screening b/c she is at average risk.  She is aware that if this comes back positive she will need a colonoscopy.  If returns neg, no further colon cancer screening. Cologuard arranged today.  An After Visit Summary was printed and given to the patient.  FOLLOW UP: Return in about 6 months (around 12/10/2016) for annual CPE (fasting).  Signed:  Crissie Sickles, MD            06/10/2016

## 2016-06-13 ENCOUNTER — Ambulatory Visit (INDEPENDENT_AMBULATORY_CARE_PROVIDER_SITE_OTHER): Payer: Medicare HMO | Admitting: Podiatry

## 2016-06-13 ENCOUNTER — Ambulatory Visit (INDEPENDENT_AMBULATORY_CARE_PROVIDER_SITE_OTHER): Payer: Medicare HMO

## 2016-06-13 DIAGNOSIS — L723 Sebaceous cyst: Secondary | ICD-10-CM

## 2016-06-13 DIAGNOSIS — M2041 Other hammer toe(s) (acquired), right foot: Secondary | ICD-10-CM

## 2016-06-13 DIAGNOSIS — M2042 Other hammer toe(s) (acquired), left foot: Secondary | ICD-10-CM

## 2016-06-13 DIAGNOSIS — B351 Tinea unguium: Secondary | ICD-10-CM

## 2016-06-13 NOTE — Progress Notes (Signed)
   Subjective:    Patient ID: Karen Bell, female    DOB: [birth year], 71 y.o.   MRN: [id number]  HPI Chief Complaint  Patient presents with  . Toe Pain    2nd toe bilateral - 2nd toe right sits under big toe when she walks, uncomfortable with certain shoes, 2nd toe left, the nail is thick and dark, longer than the other toes, nail has fallen off a few times, previous bunion surgery with "spacers"      Review of Systems  All other systems reviewed and are negative.      Objective:   Physical Exam        Assessment & Plan:

## 2016-06-13 NOTE — Progress Notes (Signed)
Subjective:    Patient ID: Karen Bell, female   DOB: 71 y.o.   MRN: [id number]   HPI patient states that she has had surgery on her feet about 10 years ago and is developed cross toes with elongated second and third toes right over left with minimal discomfort but concerned about structure and lesion on the posterior lateral aspect left heel    Review of Systems  All other systems reviewed and are negative.       Objective:  Physical Exam  Cardiovascular: Intact distal pulses.   Musculoskeletal: Normal range of motion.  Neurological: She is alert.  Skin: Skin is warm.  Nursing note and vitals reviewed.  neurovascular status intact muscle strength adequate range of motion within normal limits with patient found to have moderate crossover digit second and third right over left with healed incision sites first metatarsal bilateral with range of motion loss bilateral secondary to what appears to be hallux limitus deformity. Patient's found have good digital perfusion well oriented 3. Also found to have a small 5 x 5 mm lesion on the lateral side of the left heel that is not sore and she just was concerned about     Assessment:    Hammertoe deformity secondary to structural issues with overlapping second and third toe right over left. Possible sebaceous cyst or other cystic-type lesion left heel     Plan:    H&P conditions reviewed. At this point I do not recommend surgery after reviewing x-rays and I've recommended utilizing the spacer that she's been using along with wider toe box and I do not recommend biopsy of this left lesion lessor to grow in size change color or become painful  X-ray indicated that there is previous implants of the hallux bilateral with osteotomy left first metatarsal and slight malposition of the right first hallux. The digits are elongated and in a medial dislocated direction

## 2016-06-16 ENCOUNTER — Encounter: Payer: Self-pay | Admitting: Family Medicine

## 2016-06-23 DIAGNOSIS — Z1212 Encounter for screening for malignant neoplasm of rectum: Secondary | ICD-10-CM | POA: Diagnosis not present

## 2016-06-23 DIAGNOSIS — Z1211 Encounter for screening for malignant neoplasm of colon: Secondary | ICD-10-CM | POA: Diagnosis not present

## 2016-06-27 LAB — COLOGUARD: COLOGUARD: NEGATIVE

## 2016-06-30 ENCOUNTER — Encounter: Payer: Self-pay | Admitting: Family Medicine

## 2016-06-30 ENCOUNTER — Telehealth: Payer: Self-pay | Admitting: Family Medicine

## 2016-06-30 DIAGNOSIS — D485 Neoplasm of uncertain behavior of skin: Secondary | ICD-10-CM | POA: Diagnosis not present

## 2016-06-30 DIAGNOSIS — L905 Scar conditions and fibrosis of skin: Secondary | ICD-10-CM | POA: Diagnosis not present

## 2016-06-30 DIAGNOSIS — L814 Other melanin hyperpigmentation: Secondary | ICD-10-CM | POA: Diagnosis not present

## 2016-06-30 DIAGNOSIS — L821 Other seborrheic keratosis: Secondary | ICD-10-CM | POA: Diagnosis not present

## 2016-06-30 DIAGNOSIS — L813 Cafe au lait spots: Secondary | ICD-10-CM | POA: Diagnosis not present

## 2016-06-30 DIAGNOSIS — D1801 Hemangioma of skin and subcutaneous tissue: Secondary | ICD-10-CM | POA: Diagnosis not present

## 2016-06-30 DIAGNOSIS — D2239 Melanocytic nevi of other parts of face: Secondary | ICD-10-CM | POA: Diagnosis not present

## 2016-06-30 NOTE — Telephone Encounter (Signed)
Cologuard result was NEGATIVE. This is good. Plan on repeat 3 yrs.

## 2016-06-30 NOTE — Telephone Encounter (Signed)
Pt advised and voiced understanding.   

## 2016-07-07 DIAGNOSIS — Z124 Encounter for screening for malignant neoplasm of cervix: Secondary | ICD-10-CM | POA: Diagnosis not present

## 2016-07-07 DIAGNOSIS — Z1231 Encounter for screening mammogram for malignant neoplasm of breast: Secondary | ICD-10-CM | POA: Diagnosis not present

## 2016-07-10 ENCOUNTER — Telehealth: Payer: Self-pay | Admitting: Family Medicine

## 2016-07-10 ENCOUNTER — Encounter: Payer: Self-pay | Admitting: Family Medicine

## 2016-07-10 NOTE — Telephone Encounter (Signed)
Patient is asking that the date be corrected on the [place]. It should have been 06/27/16 rather than 02/28/16.

## 2016-07-11 ENCOUNTER — Other Ambulatory Visit: Payer: Self-pay | Admitting: Obstetrics & Gynecology

## 2016-07-11 DIAGNOSIS — R928 Other abnormal and inconclusive findings on diagnostic imaging of breast: Secondary | ICD-10-CM

## 2016-07-14 DIAGNOSIS — H2513 Age-related nuclear cataract, bilateral: Secondary | ICD-10-CM | POA: Diagnosis not present

## 2016-07-16 ENCOUNTER — Ambulatory Visit: Payer: Medicare HMO

## 2016-07-16 ENCOUNTER — Ambulatory Visit
Admission: RE | Admit: 2016-07-16 | Discharge: 2016-07-16 | Disposition: A | Discharge status: Home / Self Care | Payer: Medicare HMO | Source: Ambulatory Visit | Attending: Obstetrics & Gynecology | Admitting: Obstetrics & Gynecology

## 2016-07-16 DIAGNOSIS — R928 Other abnormal and inconclusive findings on diagnostic imaging of breast: Secondary | ICD-10-CM

## 2016-07-16 DIAGNOSIS — R922 Inconclusive mammogram: Secondary | ICD-10-CM | POA: Diagnosis not present

## 2016-12-11 ENCOUNTER — Ambulatory Visit: Payer: Medicare HMO | Admitting: Family Medicine

## 2016-12-11 ENCOUNTER — Other Ambulatory Visit: Payer: Self-pay

## 2016-12-11 ENCOUNTER — Encounter: Payer: Self-pay | Admitting: Family Medicine

## 2016-12-11 VITALS — BP 123/78 | HR 60 | Temp 97.9°F | Resp 16 | Ht 65.0 in | Wt 151.2 lb

## 2016-12-11 DIAGNOSIS — E782 Mixed hyperlipidemia: Secondary | ICD-10-CM | POA: Diagnosis not present

## 2016-12-11 DIAGNOSIS — I10 Essential (primary) hypertension: Secondary | ICD-10-CM | POA: Diagnosis not present

## 2016-12-11 NOTE — Progress Notes (Signed)
OFFICE VISIT  12/11/2016   CC:  Chief Complaint  Patient presents with  . Follow-up    RCI, pt is fasting.    HPI:    Patient is a 71 y.o. Caucasian female who presents for 6 mo f/u HTN, mixed hypercholesterolemia. Doing well.  Recently visited her daughter who lives in [place].  HTN: home monitoring consistently 120s/70s, HR 60s-70s.  HLD: tolerating fenofibrate and simvastatin.  Overall feels like she is eating a healthy diet. Exercise: walks fairly regularly.  Denies any SOB, CP, dizziness, headaches, myalgias, or arthralgias or rashes.  Past Medical History:  Diagnosis Date  . Abnormal Heart Score CT 02/06/09    CT heart score 91  . Acquired hammertoes of both feet   . Cervical disc disorder with radiculopathy    "   "       "      "  . Diverticulosis   . Family history of abdominal aortic aneurysm    pt has had AAA screening x 2, most recent was 02/2015.  Marland Kitchen History of migraine    These resolved after menopause  . HTN (hypertension)   . Hyperlipidemia, mixed   . Insomnia    maintenance problems; trazodone no help  . Left rotator cuff tear arthropathy 04/2015   Dr. Charlann Boxer  . Nephrolithiasis 1994    Past Surgical History:  Procedure Laterality Date  . Aortic ultrasound  02/13/13; 02/2015   no aneurism (screening for high risk)  . BUNIONECTOMY  2006   bilat  . carotid dopplers (screening for PAD)  11/16/2008   "minor left carotid dz" per old records.  No hx of TIA/CVA.  Marland Kitchen CESAREAN SECTION  x 2    [place]  . COLONOSCOPY  07/2006   recall 10 yrs.  'tics.  (Dr. Domenica Fail in [place], [place])  . CYST EXCISION     right index finger, base of fingernail  . kidney stone extraction  1992   Cystoscopy: fragmented ureteral stone.  No procedures for this since then.    Outpatient Medications Prior to Visit  Medication Sig Dispense Refill  . aspirin 81 MG tablet Take 81 mg by mouth daily.    . calcium citrate-vitamin D (CITRACAL+D) 315-200 MG-UNIT per tablet Take 1  tablet by mouth daily.    . cetirizine (ZYRTEC) 10 MG tablet Take 10 mg by mouth daily. Allertec    . Cholecalciferol (VITAMIN D) 2000 units tablet Take 2,000 Units by mouth daily.    . fenofibrate 54 MG tablet Take 1 tablet (54 mg total) by mouth daily. 90 tablet 3  . lisinopril (PRINIVIL,ZESTRIL) 5 MG tablet Take 1 tablet (5 mg total) by mouth daily. 90 tablet 3  . Lutein-Zeaxanthin 25-5 MG CAPS Take 1 capsule by mouth daily.    . Multiple Vitamins-Minerals (CENTRUM SILVER PO) Take by mouth.    . Omega-3 Fatty Acids (FISH OIL) 1200 MG CAPS Take 1,200 mg by mouth.    . simvastatin (ZOCOR) 40 MG tablet Take 1 tablet (40 mg total) by mouth daily. 90 tablet 3  . TURMERIC PO Take 400 mg by mouth daily.     No facility-administered medications prior to visit.     Allergies  Allergen Reactions  . Erythromycin Other (See Comments)    Abdominal pain  . Tetracyclines & Related Other (See Comments)    Joint pain    ROS As per HPI  PE: Blood pressure 123/78, pulse 60, temperature 97.9 F (36.6 C), temperature  source Oral, resp. rate 16, height 5\' 5"  (1.651 m), weight 151 lb 4 oz (68.6 kg), SpO2 99 %. Gen: Alert, well appearing.  Patient is oriented to person, place, time, and situation. AFFECT: pleasant, lucid thought and speech. CV: RRR, no m/r/g.   LUNGS: CTA bilat, nonlabored resps, good aeration in all lung fields. EXT: no clubbing, cyanosis, or edema.   LABS:  Lab Results  Component Value Date   TSH 3.55 12/04/2015   Lab Results  Component Value Date   WBC 6.1 12/20/2013   HGB 15.1 (H) 12/20/2013   HCT 46.0 12/20/2013   MCV 92.0 12/20/2013   PLT 274.0 12/20/2013   Lab Results  Component Value Date   CREATININE 0.88 12/04/2015   BUN 22 12/04/2015   NA 138 12/04/2015   K 4.7 12/04/2015   CL 100 12/04/2015   CO2 28 12/04/2015   Lab Results  Component Value Date   ALT 29 12/04/2015   AST 26 12/04/2015   ALKPHOS 44 12/04/2015   BILITOT 0.4 12/04/2015   Lab Results   Component Value Date   CHOL 163 12/04/2015   Lab Results  Component Value Date   HDL 42 (L) 12/04/2015   Lab Results  Component Value Date   LDLCALC 88 12/04/2015   Lab Results  Component Value Date   TRIG 166 (H) 12/04/2015   Lab Results  Component Value Date   CHOLHDL 3.9 12/04/2015    IMPRESSION AND PLAN:  1) HTN: The current medical regimen is effective;  continue present plan and medications. Lytes/cr today.  2) Hyperlipidemia, mixed: The current medical regimen is effective;  continue present plan and medications. Tolerating meds well. FLP and hepatic function panel today.  An After Visit Summary was printed and given to the patient.  FOLLOW UP: Return in about 6 months (around 06/11/2017) for annual CPE (fasting).  Signed:  Crissie Sickles, MD           12/11/2016

## 2016-12-12 ENCOUNTER — Encounter: Payer: Self-pay | Admitting: *Deleted

## 2016-12-12 LAB — COMPREHENSIVE METABOLIC PANEL
AG RATIO: 1.6 (calc) (ref 1.0–2.5)
ALKALINE PHOSPHATASE (APISO): 39 U/L (ref 33–130)
ALT: 19 U/L (ref 6–29)
AST: 22 U/L (ref 10–35)
Albumin: 4.5 g/dL (ref 3.6–5.1)
BILIRUBIN TOTAL: 0.4 mg/dL (ref 0.2–1.2)
BUN: 21 mg/dL (ref 7–25)
CALCIUM: 9.8 mg/dL (ref 8.6–10.4)
CO2: 30 mmol/L (ref 20–32)
Chloride: 104 mmol/L (ref 98–110)
Creat: 0.8 mg/dL (ref 0.60–0.93)
Globulin: 2.8 g/dL (calc) (ref 1.9–3.7)
Glucose, Bld: 80 mg/dL (ref 65–99)
Potassium: 4.8 mmol/L (ref 3.5–5.3)
SODIUM: 142 mmol/L (ref 135–146)
TOTAL PROTEIN: 7.3 g/dL (ref 6.1–8.1)

## 2016-12-12 LAB — LIPID PANEL
CHOL/HDL RATIO: 3.7 (calc) (ref <5.0)
CHOLESTEROL: 159 mg/dL (ref <200)
HDL: 43 mg/dL — AB (ref >50)
LDL CHOLESTEROL (CALC): 90 mg/dL
Non-HDL Cholesterol (Calc): 116 mg/dL (calc) (ref <130)
TRIGLYCERIDES: 165 mg/dL — AB (ref <150)

## 2016-12-12 LAB — EXTRA LAV TOP TUBE

## 2016-12-17 ENCOUNTER — Other Ambulatory Visit: Payer: Self-pay | Admitting: Family Medicine

## 2016-12-22 ENCOUNTER — Other Ambulatory Visit: Payer: Self-pay | Admitting: Family Medicine

## 2017-02-05 IMAGING — US US AORTA
1 series · 14 of 14 positions shown · non-contrast
Comparison: None in PACs

CLINICAL DATA: Abdominal aortic aneurysm screening; positive family
history of abdominal aortic aneurysm.

EXAM:
ULTRASOUND OF ABDOMINAL AORTA
TECHNIQUE: Ultrasound examination of the abdominal aorta was performed to
evaluate for abdominal aortic aneurysm.

[Series 1: us aorta · 0.24mm/px · 14 of 14 slices shown]
[im 1/14]
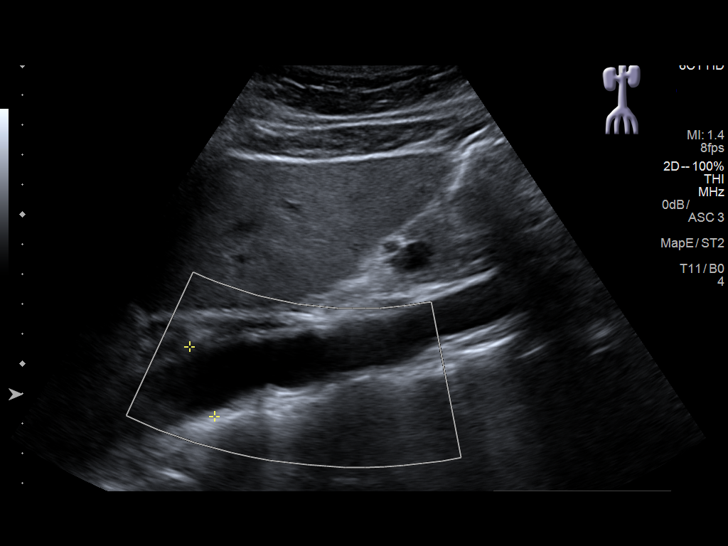
[im 2/14]
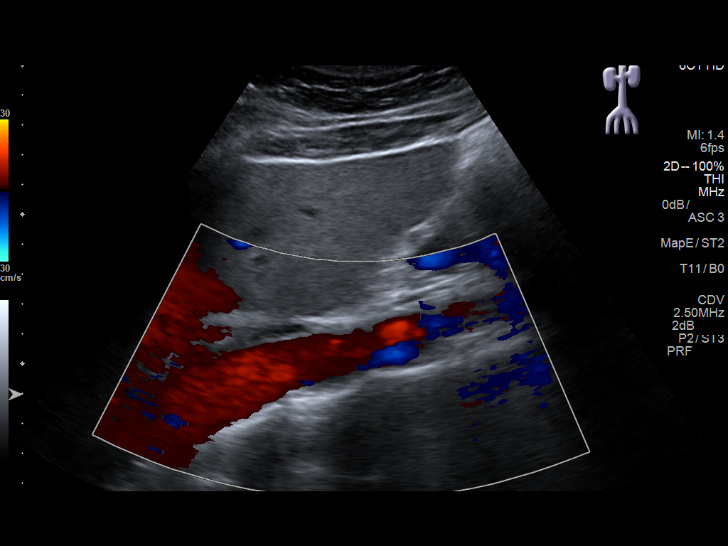
[im 3/14]
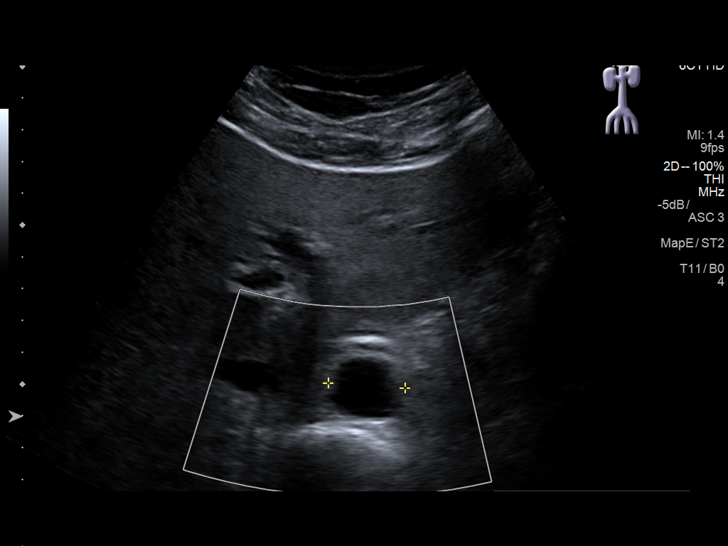
[im 4/14]
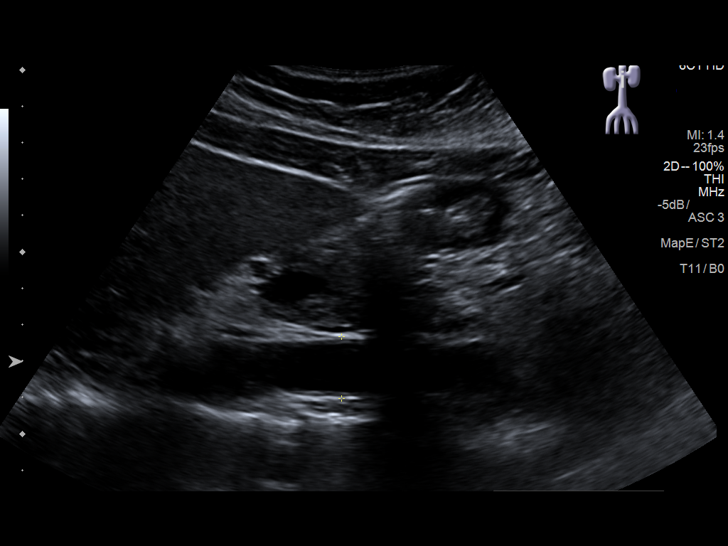
[im 5/14]
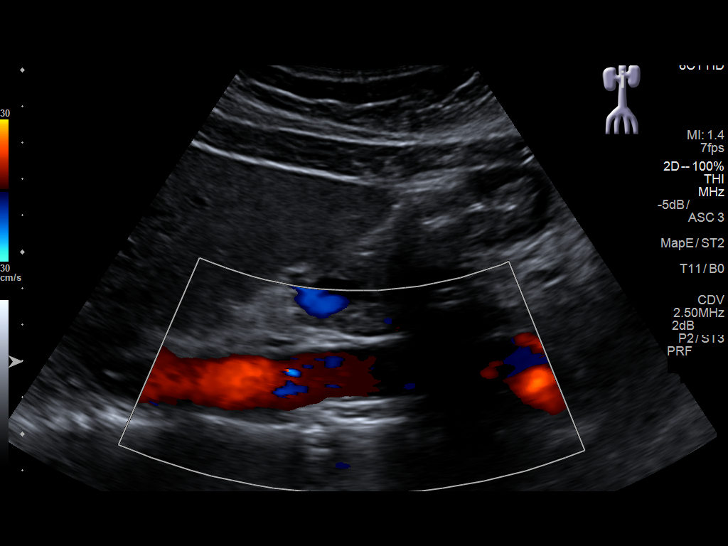
[im 6/14]
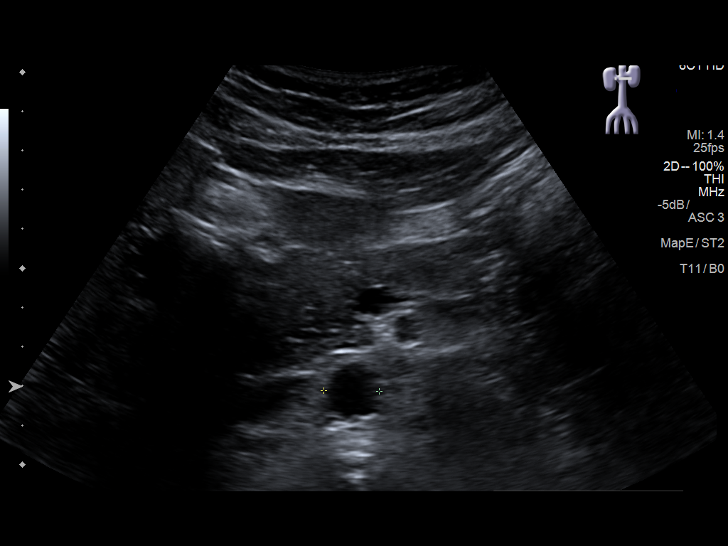
[im 7/14]
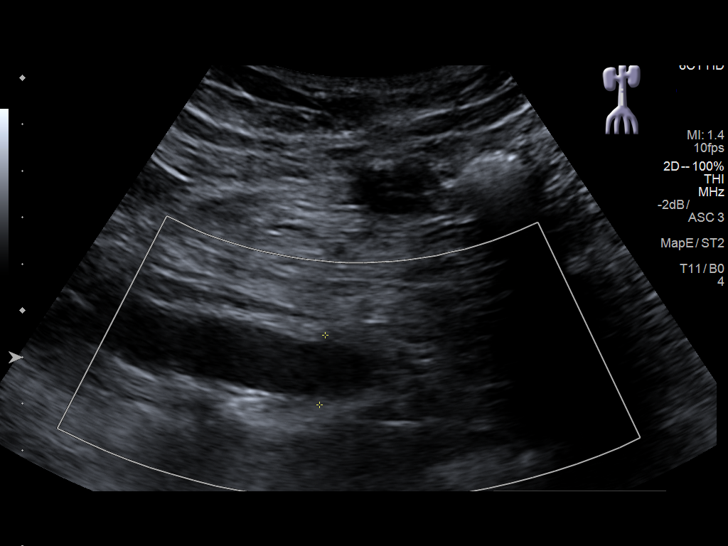
[im 8/14]
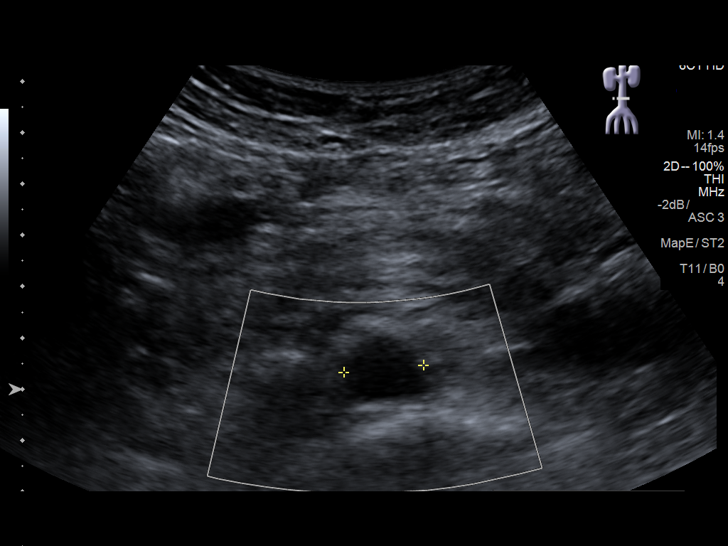
[im 9/14]
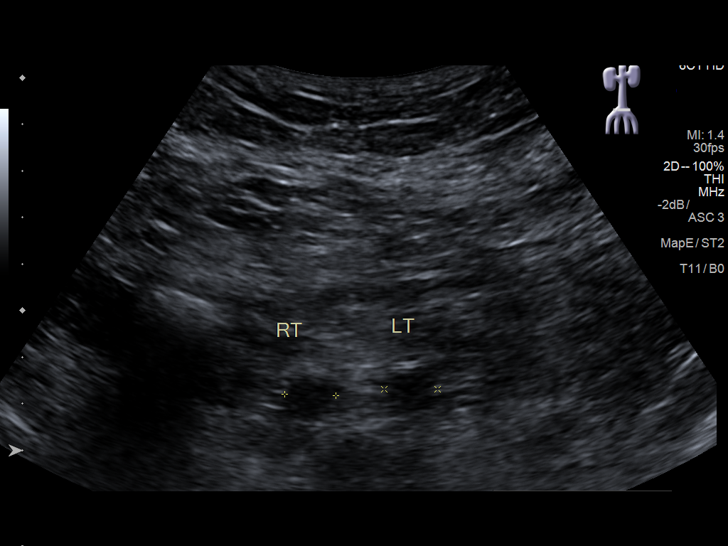
[im 10/14]
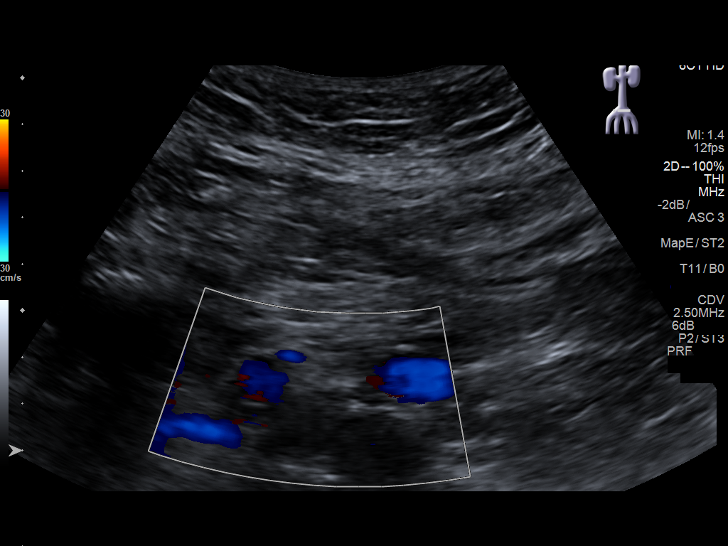
[im 11/14]
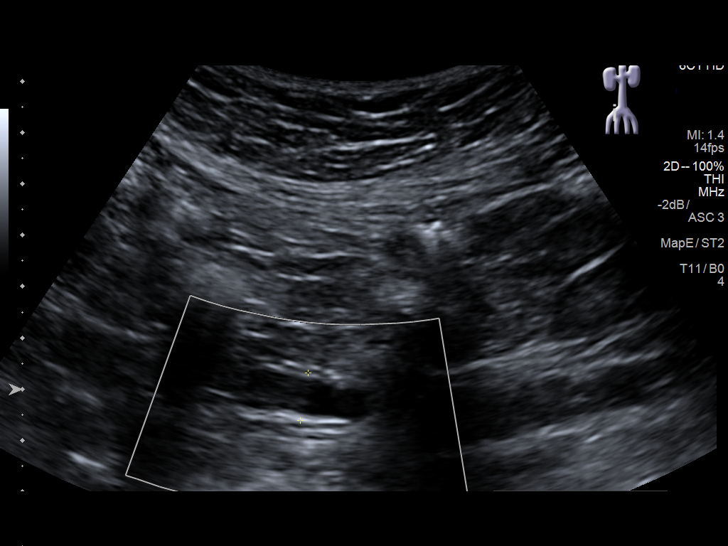
[im 12/14]
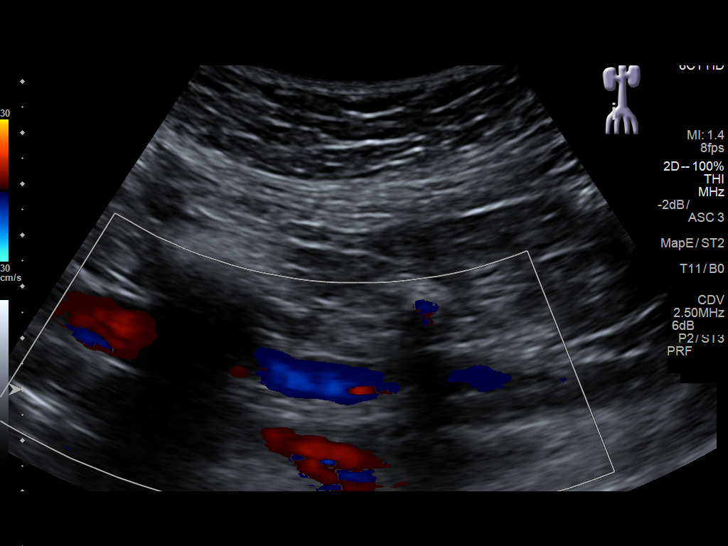
[im 13/14]
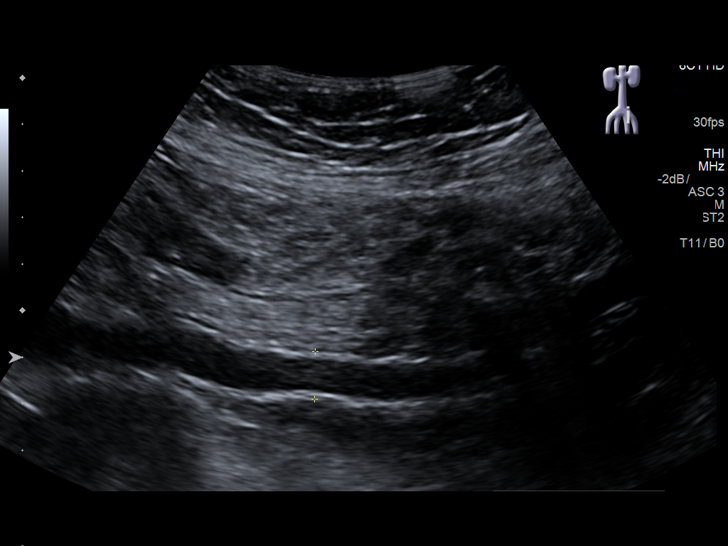
[im 14/14]
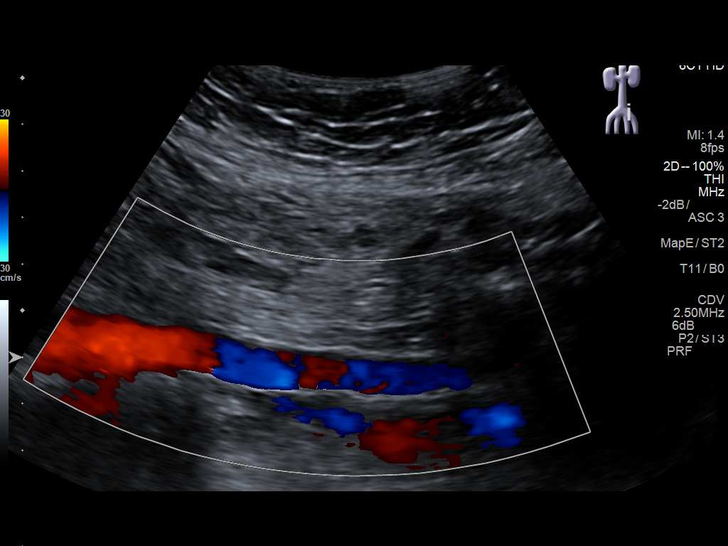

[14 of 14 positions shown; findings below may reference images not displayed]

FINDINGS: Abdominal Aorta

No aneurysm identified.

Proximal aortic dimensions:  2.5 cm AP x 2.4 cm transversely.

Mid aortic dimensions:  1.7 cm AP x 1.4 cm transversely.

Distal aortic dimensions:  1.5 cm AP x 1.6 cm transversely.

Common iliac artery maximal diameters 1.1 cm on the left and 1.0 cm
on the right.
IMPRESSION: There is no abdominal aortic aneurysm.

## 2017-06-11 ENCOUNTER — Encounter: Payer: Medicare HMO | Admitting: Family Medicine

## 2017-06-12 ENCOUNTER — Ambulatory Visit (INDEPENDENT_AMBULATORY_CARE_PROVIDER_SITE_OTHER): Payer: Medicare HMO | Admitting: Family Medicine

## 2017-06-12 ENCOUNTER — Encounter: Payer: Self-pay | Admitting: Family Medicine

## 2017-06-12 VITALS — BP 115/78 | HR 69 | Temp 98.2°F | Resp 16 | Ht 65.0 in | Wt 152.5 lb

## 2017-06-12 DIAGNOSIS — I10 Essential (primary) hypertension: Secondary | ICD-10-CM | POA: Diagnosis not present

## 2017-06-12 DIAGNOSIS — E663 Overweight: Secondary | ICD-10-CM | POA: Diagnosis not present

## 2017-06-12 DIAGNOSIS — E782 Mixed hyperlipidemia: Secondary | ICD-10-CM

## 2017-06-12 MED ORDER — SIMVASTATIN 40 MG PO TABS: 40.0000 mg | ORAL_TABLET | Freq: Every day | ORAL | 3 refills | Status: DC

## 2017-06-12 MED ORDER — FENOFIBRATE 54 MG PO TABS: 54.0000 mg | ORAL_TABLET | Freq: Every day | ORAL | 3 refills | Status: DC

## 2017-06-12 NOTE — Patient Instructions (Signed)

## 2017-06-12 NOTE — Progress Notes (Signed)
Office Note 06/12/2017  CC:  Chief Complaint  Patient presents with  . Annual Exam    Pt is fasting.     HPI:  Karen Bell is a 72 y.o. White female who is here for annual health maintenance exam. Gets GYN f/u with Dr. Benjie Karvonen.  HTN: bp has been consistently 110-120/70s.  Exercise: remains active but no formal exercise--[facility] [employer] Diet: eating smaller portions sizes, tries to avoid bread/pasta more.   Eyes: gets exam q1-75yrs. Dental: preventatives UTD.   Past Medical History:  Diagnosis Date  . Abnormal Heart Score CT 02/06/09    CT heart score 91  . Acquired hammertoes of both feet   . Cervical disc disorder with radiculopathy    "   "       "      "  . Diverticulosis   . Family history of abdominal aortic aneurysm    pt has had AAA screening x 2, most recent was 02/2015.  Marland Kitchen History of migraine    These resolved after menopause  . HTN (hypertension)   . Hyperlipidemia, mixed   . Insomnia    maintenance problems; trazodone no help  . Left rotator cuff tear arthropathy 04/2015   Dr. Charlann Boxer  . Nephrolithiasis 1994    Past Surgical History:  Procedure Laterality Date  . Aortic ultrasound  02/13/13; 02/2015   no aneurism (screening for high risk)  . BUNIONECTOMY  2006   bilat  . carotid dopplers (screening for PAD)  11/16/2008   "minor left carotid dz" per old records.  No hx of TIA/CVA.  Marland Kitchen CESAREAN SECTION  x 2    [place]  . COLONOSCOPY  07/2006   recall 10 yrs.  'tics.  (Dr. Domenica Fail in [place], [place])  . CYST EXCISION     right index finger, base of fingernail  . kidney stone extraction  1992   Cystoscopy: fragmented ureteral stone.  No procedures for this since then.    Family History  Problem Relation Age of Onset  . Cancer Mother        ? ovarian vs endometrial  . Hyperlipidemia Father   . Hypertension Father   . Bladder Cancer Brother   . Colon cancer Unknown        Negative history.  . Diabetes Unknown     Social  History   Socioeconomic History  . Marital status: Married    Spouse name: Not on file  . Number of children: Not on file  . Years of education: Not on file  . Highest education level: Not on file  Occupational History  . Not on file  Social Needs  . Financial resource strain: Not on file  . Food insecurity:    Worry: Not on file    Inability: Not on file  . Transportation needs:    Medical: Not on file    Non-medical: Not on file  Tobacco Use  . Smoking status: Never Smoker  . Smokeless tobacco: Never Used  Substance and Sexual Activity  . Alcohol use: Not on file  . Drug use: Not on file  . Sexual activity: Not on file  Lifestyle  . Physical activity:    Days per week: Not on file    Minutes per session: Not on file  . Stress: Not on file  Relationships  . Social connections:    Talks on phone: Not on file    Gets together:  Not on file    Attends religious service: Not on file    Active member of club or organization: Not on file    Attends meetings of clubs or organizations: Not on file    Relationship status: Not on file  . Intimate partner violence:    Fear of current or ex partner: Not on file    Emotionally abused: Not on file    Physically abused: Not on file    Forced sexual activity: Not on file  Other Topics Concern  . Not on file  Social History Narrative   Married, 2 daughters.  Four older brothers, 3 of which have had abdominal aneurisms.   Relocated from [place] area 05/2013.   Occupation: retired Research officer, political party.   No tob, occ alcohol.      Outpatient Medications Prior to Visit  Medication Sig Dispense Refill  . calcium citrate-vitamin D (CITRACAL+D) 315-200 MG-UNIT per tablet Take 1 tablet by mouth daily.    . cetirizine (ZYRTEC) 10 MG tablet Take 10 mg by mouth daily. Allertec    . Cholecalciferol (VITAMIN D) 2000 units tablet Take 2,000 Units by mouth daily.    Marland Kitchen lisinopril (PRINIVIL,ZESTRIL) 5 MG tablet TAKE 1 TABLET DAILY 90 tablet 3  .  Lutein-Zeaxanthin 25-5 MG CAPS Take 1 capsule by mouth daily.    . Multiple Vitamins-Minerals (CENTRUM SILVER PO) Take by mouth.    . Omega-3 Fatty Acids (FISH OIL) 1200 MG CAPS Take 1,200 mg by mouth.    . TURMERIC PO Take 400 mg by mouth daily.    Marland Kitchen aspirin 81 MG tablet Take 81 mg by mouth daily.    . fenofibrate 54 MG tablet TAKE 1 TABLET DAILY 90 tablet 1  . simvastatin (ZOCOR) 40 MG tablet TAKE 1 TABLET DAILY 90 tablet 1   No facility-administered medications prior to visit.     Allergies  Allergen Reactions  . Erythromycin Other (See Comments)    Abdominal pain  . Tetracyclines & Related Other (See Comments)    Joint pain    ROS Review of Systems  Constitutional: Negative for appetite change, chills, fatigue and fever.  HENT: Negative for congestion, dental problem, ear pain and sore throat.   Eyes: Negative for discharge, redness and visual disturbance.  Respiratory: Negative for cough, chest tightness, shortness of breath and wheezing.   Cardiovascular: Negative for chest pain, palpitations and leg swelling.  Gastrointestinal: Negative for abdominal pain, blood in stool, diarrhea, nausea and vomiting.  Genitourinary: Negative for difficulty urinating, dysuria, flank pain, frequency, hematuria and urgency.  Musculoskeletal: Negative for arthralgias, back pain, joint swelling, myalgias and neck stiffness.  Skin: Negative for pallor and rash.  Neurological: Negative for dizziness, speech difficulty, weakness and headaches.  Hematological: Negative for adenopathy. Does not bruise/bleed easily.  Psychiatric/Behavioral: Negative for confusion and sleep disturbance. The patient is not nervous/anxious.     PE; Blood pressure 115/78, pulse 69, temperature 98.2 F (36.8 C), temperature source Oral, resp. rate 16, height 5\' 5"  (1.651 m), weight 152 lb 8 oz (69.2 kg), SpO2 97 %. Body mass index is 25.38 kg/m. Pt examined with Helayne Seminole, CMA, as chaperone.  Gen: Alert,  well appearing.  Patient is oriented to person, place, time, and situation. AFFECT: pleasant, lucid thought and speech. ENT: Ears: EACs clear, normal epithelium.  TMs with good light reflex and landmarks bilaterally.  Eyes: no injection, icteris, swelling, or exudate.  EOMI, PERRLA. Nose: no drainage or turbinate edema/swelling.  No injection or focal  lesion.  Mouth: lips without lesion/swelling.  Oral mucosa pink and moist.  Dentition intact and without obvious caries or gingival swelling.  Oropharynx without erythema, exudate, or swelling.  Neck: supple/nontender.  No LAD, mass, or TM.  Carotid pulses 2+ bilaterally, without bruits. CV: RRR, no m/r/g.   LUNGS: CTA bilat, nonlabored resps, good aeration in all lung fields. ABD: soft, NT, ND, BS normal.  No hepatospenomegaly or mass.  No bruits. EXT: no clubbing, cyanosis, or edema.  Musculoskeletal: no joint swelling, erythema, warmth, or tenderness.  ROM of all joints intact. Skin - no sores or suspicious lesions or rashes or color changes   Pertinent labs:  Lab Results  Component Value Date   TSH 3.55 12/04/2015   Lab Results  Component Value Date   WBC 6.1 12/20/2013   HGB 15.1 (H) 12/20/2013   HCT 46.0 12/20/2013   MCV 92.0 12/20/2013   PLT 274.0 12/20/2013   Lab Results  Component Value Date   CREATININE 0.80 12/11/2016   BUN 21 12/11/2016   NA 142 12/11/2016   K 4.8 12/11/2016   CL 104 12/11/2016   CO2 30 12/11/2016   Lab Results  Component Value Date   ALT 19 12/11/2016   AST 22 12/11/2016   ALKPHOS 44 12/04/2015   BILITOT 0.4 12/11/2016   Lab Results  Component Value Date   CHOL 159 12/11/2016   Lab Results  Component Value Date   HDL 43 (L) 12/11/2016   Lab Results  Component Value Date   LDLCALC 90 12/11/2016   Lab Results  Component Value Date   TRIG 165 (H) 12/11/2016   Lab Results  Component Value Date   CHOLHDL 3.7 12/11/2016    ASSESSMENT AND PLAN:   Health maintenance exam: Reviewed  age and gender appropriate health maintenance issues (prudent diet, regular exercise, health risks of tobacco and excessive alcohol, use of seatbelts, fire alarms in home, use of sunscreen).  Also reviewed age and gender appropriate health screening as well as vaccine recommendations. Vaccines: UTD.  Shingrix discussed--pt to think about this. Labs: FLP, BMET (HLD and HTN). Cervical ca screening: via GYN MD. Breast ca screening: via GYN MD. Colon ca screening:  Last colonoscopy 2008 (normal)-->cologuard NEG 06/2016.  Repeat cologuard 06/2019.  An After Visit Summary was printed and given to the patient.  FOLLOW UP:  Return in about 6 months (around 12/12/2017) for , routine chronic illness f/u.  AWV with Maudie Mercury at any time..  Signed:  Crissie Sickles, MD           06/12/2017

## 2017-06-13 LAB — BASIC METABOLIC PANEL
BUN: 24 mg/dL (ref 7–25)
CO2: 29 mmol/L (ref 20–32)
Calcium: 9.7 mg/dL (ref 8.6–10.4)
Chloride: 101 mmol/L (ref 98–110)
Creat: 0.91 mg/dL (ref 0.60–0.93)
GLUCOSE: 76 mg/dL (ref 65–99)
Potassium: 4.8 mmol/L (ref 3.5–5.3)
Sodium: 139 mmol/L (ref 135–146)

## 2017-06-13 LAB — LIPID PANEL
CHOL/HDL RATIO: 3.7 (calc) (ref <5.0)
CHOLESTEROL: 163 mg/dL (ref <200)
HDL: 44 mg/dL — AB (ref >50)
LDL Cholesterol (Calc): 94 mg/dL (calc)
NON-HDL CHOLESTEROL (CALC): 119 mg/dL (ref <130)
TRIGLYCERIDES: 149 mg/dL (ref <150)

## 2017-06-13 LAB — EXTRA LAV TOP TUBE

## 2017-06-15 ENCOUNTER — Encounter: Payer: Self-pay | Admitting: *Deleted

## 2017-07-06 DIAGNOSIS — S43011A Anterior subluxation of right humerus, initial encounter: Secondary | ICD-10-CM

## 2017-07-06 HISTORY — DX: Anterior subluxation of right humerus, initial encounter: S43.011A

## 2017-07-07 ENCOUNTER — Other Ambulatory Visit: Payer: Self-pay | Admitting: Obstetrics & Gynecology

## 2017-07-07 DIAGNOSIS — Z1231 Encounter for screening mammogram for malignant neoplasm of breast: Secondary | ICD-10-CM

## 2017-07-15 DIAGNOSIS — H2513 Age-related nuclear cataract, bilateral: Secondary | ICD-10-CM | POA: Diagnosis not present

## 2017-07-24 DIAGNOSIS — Z01419 Encounter for gynecological examination (general) (routine) without abnormal findings: Secondary | ICD-10-CM | POA: Diagnosis not present

## 2017-07-30 ENCOUNTER — Ambulatory Visit
Admission: RE | Admit: 2017-07-30 | Discharge: 2017-07-30 | Disposition: A | Discharge status: Home / Self Care | Payer: Medicare HMO | Source: Ambulatory Visit | Attending: Obstetrics & Gynecology | Admitting: Obstetrics & Gynecology

## 2017-07-30 DIAGNOSIS — Z1231 Encounter for screening mammogram for malignant neoplasm of breast: Secondary | ICD-10-CM

## 2017-08-03 NOTE — Progress Notes (Signed)
[name] [place], [place] [address] Phone: [phone] Subjective:       CC: Right arm pain  YWV:[id number]  Karen Bell is a 72 y.o. female coming in with complaint of right arm pain. States she was pullling a backpack when she felt a pain in her right arm. Has the same pain in the left arm. States that her shoulders had an ache earlier this morning. Pain shoots down the arm. Initially she had numb ness and tingling. No neck pain noted.   Onset- May 6th Location- Upper arm Duration- Dependent on movement Character- Ache  Aggravating factors- Reaching, ADL Reliving factors-  Therapies tried- Icing 2x a day initally Severity-8 out of 10 and no improvement     Past Medical History:  Diagnosis Date  . Acquired hammertoes of both feet   . Cervical disc disorder with radiculopathy    "   "       "      "  . Colon cancer screening    Colonoscopy normal 2008.  Cologuard NEG 06/2016.  Repeat cologuard 06/2019.  . Diverticulosis   . Family history of abdominal aortic aneurysm    pt has had AAA screening x 2, most recent was 02/2015.  Marland Kitchen History of migraine    These resolved after menopause  . HTN (hypertension)   . Hyperlipidemia, mixed   . Insomnia    maintenance problems; trazodone no help  . Left rotator cuff tear arthropathy 04/2015   Dr. Charlann Boxer  . Nephrolithiasis 1994   Past Surgical History:  Procedure Laterality Date  . Aortic ultrasound  02/13/13; 02/2015   no aneurism (screening for high risk)  . BUNIONECTOMY  2006   bilat  . carotid dopplers (screening for PAD)  11/16/2008   "minor left carotid dz" per old records.  No hx of TIA/CVA.  Marland Kitchen CESAREAN SECTION  x 2    [place]  . COLONOSCOPY  07/2006   recall 10 yrs.  'tics.  (Dr. Domenica Fail in [place], [place])  . CYST EXCISION     right index finger, base of fingernail  . kidney stone extraction  1992   Cystoscopy: fragmented ureteral stone.  No procedures for this since  then.   Social History   Socioeconomic History  . Marital status: Married    Spouse name: Not on file  . Number of children: Not on file  . Years of education: Not on file  . Highest education level: Not on file  Occupational History  . Not on file  Social Needs  . Financial resource strain: Not on file  . Food insecurity:    Worry: Not on file    Inability: Not on file  . Transportation needs:    Medical: Not on file    Non-medical: Not on file  Tobacco Use  . Smoking status: Never Smoker  . Smokeless tobacco: Never Used  Substance and Sexual Activity  . Alcohol use: Not on file  . Drug use: Not on file  . Sexual activity: Not on file  Lifestyle  . Physical activity:    Days per week: Not on file    Minutes per session: Not on file  . Stress: Not on file  Relationships  . Social connections:    Talks on phone: Not on file    Gets together: Not on file    Attends religious service: Not on file    Active member of  club or organization: Not on file    Attends meetings of clubs or organizations: Not on file    Relationship status: Not on file  Other Topics Concern  . Not on file  Social History Narrative   Married, 2 daughters.  Four older brothers, 3 of which have had abdominal aneurisms.   Relocated from [place] area 05/2013.   Occupation: retired Research officer, political party.   No tob, occ alcohol.     Allergies  Allergen Reactions  . Erythromycin Other (See Comments)    Abdominal pain  . Tetracyclines & Related Other (See Comments)    Joint pain   Family History  Problem Relation Age of Onset  . Cancer Mother        ? ovarian vs endometrial  . Hyperlipidemia Father   . Hypertension Father   . Bladder Cancer Brother   . Colon cancer Unknown        Negative history.  . Diabetes Unknown      Past medical history, social, surgical and family history all reviewed in electronic medical record.  No pertanent information unless stated regarding to the chief  complaint.   Review of Systems:Review of systems updated and as accurate as of 08/04/17  No headache, visual changes, nausea, vomiting, diarrhea, constipation, dizziness, abdominal pain, skin rash, fevers, chills, night sweats, weight loss, swollen lymph nodes, body aches, joint swelling, muscle aches, chest pain, shortness of breath, mood changes.   Objective  Blood pressure (!) 154/84, pulse 86, height 5\' 5"  (1.651 m), weight 154 lb (69.9 kg), SpO2 97 %. Systems examined below as of 08/04/17   General: No apparent distress alert and oriented x3 mood and affect normal, dressed appropriately.  HEENT: Pupils equal, extraocular movements intact  Respiratory: Patient's speak in full sentences and does not appear short of breath  Cardiovascular: No lower extremity edema, non tender, no erythema  Skin: Warm dry intact with no signs of infection or rash on extremities or on axial skeleton.  Abdomen: Soft nontender  Neuro: Cranial nerves II through XII are intact, neurovascularly intact in all extremities with 2+ DTRs and 2+ pulses.  Lymph: No lymphadenopathy of posterior or anterior cervical chain or axillae bilaterally.  Gait normal with good balance and coordination.  MSK:  Non tender with full range of motion and good stability and symmetric strength and tone of  elbows, wrist, hip, knee and ankles bilaterally.  Right shoulder exam shows some mild anterior prominence of the humerus.  Patient does have some mild crepitus with range of motion.  Severe pain with internal and external range of motion.  Found to have subluxation.  Positive impingement.  Patient had manipulation done lab for replacement.  Strength intact.    Impression and Recommendations:     This case required medical decision making of moderate complexity.      Note: This dictation was prepared with Dragon dictation along with smaller phrase technology. Any transcriptional errors that result from this process are  unintentional.

## 2017-08-04 ENCOUNTER — Encounter: Payer: Self-pay | Admitting: Family Medicine

## 2017-08-04 ENCOUNTER — Ambulatory Visit: Payer: Medicare HMO | Admitting: Family Medicine

## 2017-08-04 DIAGNOSIS — S43001A Unspecified subluxation of right shoulder joint, initial encounter: Secondary | ICD-10-CM | POA: Diagnosis not present

## 2017-08-04 NOTE — Patient Instructions (Signed)
Good to see you  Ice is your friend Ice 20 minutes 2 times daily. Usually after activity and before bed. pennsaid pinkie amount topically 2 times daily as needed.  Keep hands within peripheral vision  Exercises 3 times a week.   See me again in 4 weeks

## 2017-08-04 NOTE — Assessment & Plan Note (Signed)
Relocation done today.  Discussed icing regimen and home exercise.  Discussed which activities to do.  Home exercises given.  Do not feel that x-rays are necessary because would not change management.  No likely fracture.  Discussed worsening symptoms we will consider more advanced imaging.  Follow-up again 2 to 4 weeks

## 2017-08-09 ENCOUNTER — Encounter: Payer: Self-pay | Admitting: Family Medicine

## 2017-08-23 ENCOUNTER — Encounter: Payer: Self-pay | Admitting: Family Medicine

## 2017-09-03 ENCOUNTER — Ambulatory Visit: Payer: Medicare HMO | Admitting: Family Medicine

## 2017-09-03 ENCOUNTER — Encounter: Payer: Self-pay | Admitting: Family Medicine

## 2017-09-03 DIAGNOSIS — S43001D Unspecified subluxation of right shoulder joint, subsequent encounter: Secondary | ICD-10-CM | POA: Diagnosis not present

## 2017-09-03 DIAGNOSIS — M5010 Cervical disc disorder with radiculopathy, unspecified cervical region: Secondary | ICD-10-CM | POA: Diagnosis not present

## 2017-09-03 DIAGNOSIS — M12812 Other specific arthropathies, not elsewhere classified, left shoulder: Secondary | ICD-10-CM

## 2017-09-03 DIAGNOSIS — M75102 Unspecified rotator cuff tear or rupture of left shoulder, not specified as traumatic: Secondary | ICD-10-CM | POA: Diagnosis not present

## 2017-09-03 NOTE — Assessment & Plan Note (Signed)
Stable.  Has been 2 years.  No significant change in management.

## 2017-09-03 NOTE — Assessment & Plan Note (Signed)
Patient does have underlying arthritic changes of the neck that is likely contributing to some of the discomfort of the shoulders.  Discussed posture and ergonomics and proper lifting technique again.  No change in medications.

## 2017-09-03 NOTE — Patient Instructions (Signed)
Good to see you  Ice is your friend especially at night Continue the exercises 1-2 times a week  OK to lift but 50% of what you were doing and increase 10 % a week Keep hands within peripheral vison at all times See me again when you need me  [phone]

## 2017-09-03 NOTE — Assessment & Plan Note (Signed)
Improving at the moment.  No significant change in management.  Discussed with patient about keeping hands with good peripheral vision and proper lifting mechanics but is able to start if she would like.  Follow-up as needed

## 2017-09-03 NOTE — Progress Notes (Signed)
[facility] [address] [place], [place] [address] Phone: [phone] Subjective:     I [name] am serving as a Education administrator for Dr. Hulan Saas.  CC: Right shoulder pain  UDJ:[id number]  Karen Bell is a 72 y.o. female coming in with complaint of right shoulder pain. States that the shoulder is painless. Achy in the mornings.  Patient was found to have a biceps subluxation.  Patient is feeling about 95% better.  Certain lifting still gives some mild twinge.  Patient denies any numbness.  States that overall seems to be making significant improvement.  Left shoulder still gives some mild discomfort but nothing severe.      Past Medical History:  Diagnosis Date  . Acquired hammertoes of both feet   . Anterior subluxation of right shoulder 07/2017   Relocated by Dr. Tamala Julian  . Cervical disc disorder with radiculopathy    "   "       "      "  . Colon cancer screening    Colonoscopy normal 2008.  Cologuard NEG 06/2016.  Repeat cologuard 06/2019.  . Diverticulosis   . Family history of abdominal aortic aneurysm    pt has had AAA screening x 2, most recent was 02/2015.  Marland Kitchen History of migraine    These resolved after menopause  . HTN (hypertension)   . Hyperlipidemia, mixed   . Insomnia    maintenance problems; trazodone no help  . Left rotator cuff tear arthropathy 04/2015   Dr. Charlann Boxer  . Nephrolithiasis 1994   Past Surgical History:  Procedure Laterality Date  . Aortic ultrasound  02/13/13; 02/2015   no aneurism (screening for high risk)  . BUNIONECTOMY  2006   bilat  . carotid dopplers (screening for PAD)  11/16/2008   "minor left carotid dz" per old records.  No hx of TIA/CVA.  Marland Kitchen CESAREAN SECTION  x 2    [place]  . COLONOSCOPY  07/2006   recall 10 yrs.  'tics.  (Dr. Domenica Fail in [place], [place])  . CYST EXCISION     right index finger, base of fingernail  . kidney stone extraction  1992   Cystoscopy: fragmented ureteral stone.  No  procedures for this since then.   Social History   Socioeconomic History  . Marital status: Married    Spouse name: Not on file  . Number of children: Not on file  . Years of education: Not on file  . Highest education level: Not on file  Occupational History  . Not on file  Social Needs  . Financial resource strain: Not on file  . Food insecurity:    Worry: Not on file    Inability: Not on file  . Transportation needs:    Medical: Not on file    Non-medical: Not on file  Tobacco Use  . Smoking status: Never Smoker  . Smokeless tobacco: Never Used  Substance and Sexual Activity  . Alcohol use: Not on file  . Drug use: Not on file  . Sexual activity: Not on file  Lifestyle  . Physical activity:    Days per week: Not on file    Minutes per session: Not on file  . Stress: Not on file  Relationships  . Social connections:    Talks on phone: Not on file    Gets together: Not on file    Attends religious service: Not on file    Active member  of club or organization: Not on file    Attends meetings of clubs or organizations: Not on file    Relationship status: Not on file  Other Topics Concern  . Not on file  Social History Narrative   Married, 2 daughters.  Four older brothers, 3 of which have had abdominal aneurisms.   Relocated from [place] area 05/2013.   Occupation: retired Research officer, political party.   No tob, occ alcohol.     Allergies  Allergen Reactions  . Erythromycin Other (See Comments)    Abdominal pain  . Tetracyclines & Related Other (See Comments)    Joint pain   Family History  Problem Relation Age of Onset  . Cancer Mother        ? ovarian vs endometrial  . Hyperlipidemia Father   . Hypertension Father   . Bladder Cancer Brother   . Colon cancer Unknown        Negative history.  . Diabetes Unknown      Current Outpatient Medications (Cardiovascular):  .  fenofibrate 54 MG tablet, Take 1 tablet (54 mg total) by mouth daily. Marland Kitchen  lisinopril  (PRINIVIL,ZESTRIL) 5 MG tablet, TAKE 1 TABLET DAILY .  simvastatin (ZOCOR) 40 MG tablet, Take 1 tablet (40 mg total) by mouth daily.  Current Outpatient Medications (Respiratory):  .  cetirizine (ZYRTEC) 10 MG tablet, Take 10 mg by mouth daily. Allertec    Current Outpatient Medications (Other):  .  calcium citrate-vitamin D (CITRACAL+D) 315-200 MG-UNIT per tablet, Take 1 tablet by mouth daily. .  Cholecalciferol (VITAMIN D) 2000 units tablet, Take 2,000 Units by mouth daily. .  Lutein-Zeaxanthin 25-5 MG CAPS, Take 1 capsule by mouth daily. .  Multiple Vitamins-Minerals (CENTRUM SILVER PO), Take by mouth. .  Omega-3 Fatty Acids (FISH OIL) 1200 MG CAPS, Take 1,200 mg by mouth. .  TURMERIC PO, Take 400 mg by mouth daily.    Past medical history, social, surgical and family history all reviewed in electronic medical record.  No pertanent information unless stated regarding to the chief complaint.   Review of Systems:  No headache, visual changes, nausea, vomiting, diarrhea, constipation, dizziness, abdominal pain, skin rash, fevers, chills, night sweats, weight loss, swollen lymph nodes, body aches, joint swelling, muscle aches, chest pain, shortness of breath, mood changes.   Objective  Blood pressure 140/84, pulse 94, height 5\' 5"  (1.651 m), weight 156 lb (70.8 kg), SpO2 96 %.    General: No apparent distress alert and oriented x3 mood and affect normal, dressed appropriately.  HEENT: Pupils equal, extraocular movements intact  Respiratory: Patient's speak in full sentences and does not appear short of breath  Cardiovascular: No lower extremity edema, non tender, no erythema  Skin: Warm dry intact with no signs of infection or rash on extremities or on axial skeleton.  Abdomen: Soft nontender  Neuro: Cranial nerves II through XII are intact, neurovascularly intact in all extremities with 2+ DTRs and 2+ pulses.  Lymph: No lymphadenopathy of posterior or anterior cervical chain or  axillae bilaterally.  Gait normal with good balance and coordination.  MSK:  Non tender with full range of motion and good stability and symmetric strength and tone of elbows, wrist, hip, knee and ankles bilaterally.  Shoulder: Right Inspection reveals no abnormalities, atrophy or asymmetry. Palpation is normal with no tenderness over AC joint or bicipital groove. ROM is full in all planes. Rotator cuff strength normal throughout. Mild positive impingement Speeds and Yergason's tests are mild positive. No labral pathology  noted with negative Obrien's, negative clunk and good stability. Normal scapular function observed. No painful arc and no drop arm sign. No apprehension sign Lateral shoulder still has some weakness with 4 out of 5 strength.  Some mild crepitus but near full range of motion  Neck exam does have some loss of lordosis with crepitus and full range of motion.  Patient was neurovascularly intact in the extremities with good grip strength.  Deep tendon reflexes appear to be intact.  Mild loss of range of motion as stated above   Impression and Recommendations:     This case required medical decision making of moderate complexity. The above documentation has been reviewed and is accurate and complete Lyndal Pulley, DO       Note: This dictation was prepared with Dragon dictation along with smaller phrase technology. Any transcriptional errors that result from this process are unintentional.

## 2017-09-25 ENCOUNTER — Encounter: Payer: Self-pay | Admitting: Family Medicine

## 2017-09-25 ENCOUNTER — Ambulatory Visit: Payer: Medicare HMO | Admitting: Family Medicine

## 2017-09-25 VITALS — BP 113/73 | HR 99 | Temp 99.0°F | Resp 16 | Ht 65.0 in | Wt 154.1 lb

## 2017-09-25 DIAGNOSIS — R5381 Other malaise: Secondary | ICD-10-CM

## 2017-09-25 DIAGNOSIS — R351 Nocturia: Secondary | ICD-10-CM

## 2017-09-25 DIAGNOSIS — R5383 Other fatigue: Secondary | ICD-10-CM | POA: Diagnosis not present

## 2017-09-25 DIAGNOSIS — N3001 Acute cystitis with hematuria: Secondary | ICD-10-CM

## 2017-09-25 DIAGNOSIS — R5081 Fever presenting with conditions classified elsewhere: Secondary | ICD-10-CM

## 2017-09-25 LAB — POCT URINALYSIS DIPSTICK
Bilirubin, UA: NEGATIVE
GLUCOSE UA: NEGATIVE
Ketones, UA: NEGATIVE
Nitrite, UA: NEGATIVE
PH UA: 6 (ref 5.0–8.0)
Protein, UA: NEGATIVE
RBC UA: 80
SPEC GRAV UA: 1.015 (ref 1.010–1.025)
UROBILINOGEN UA: 0.2 U/dL

## 2017-09-25 MED ORDER — CEFDINIR 300 MG PO CAPS: 300.0000 mg | ORAL_CAPSULE | Freq: Two times a day (BID) | ORAL | 0 refills | Status: DC

## 2017-09-25 NOTE — Progress Notes (Signed)
OFFICE VISIT  09/25/2017   CC:  Chief Complaint  Patient presents with  . Fever    102F this AM, nausea, HA, dizzy   HPI:    Patient is a 72 y.o. Caucasian female never smoker who presents for respiratory complaints. Onset of mild dizziness and malaise/fatigue last night, didn't sleep well.  This morning temp was 102.  Some nausea but no vomiting.  + HA.  No neck stiffness or joint aches or muscle aches.  Has chronic bilat shoulder pains. Ate cereal this morning.  No diarrhea.  No abd pain currently, but couple days ago had cramping in R LB, R flank, RLQ, bloating/burping.  This resolved in about 1 day.  No hematuria.   No nasal sx's, ST, or cough.  No SOB.  No dysuria, no urinary urgency.  Nocturia q2h last night which is unusual for her.  No rash.  NO CP. Sick contact: grandson with uri and AOM. No known tick bites.  Past Medical History:  Diagnosis Date  . Acquired hammertoes of both feet   . Anterior subluxation of right shoulder 07/2017   Relocated by Dr. Tamala Julian  . Cervical disc disorder with radiculopathy    "   "       "      "  . Colon cancer screening    Colonoscopy normal 2008.  Cologuard NEG 06/2016.  Repeat cologuard 06/2019.  . Diverticulosis   . Family history of abdominal aortic aneurysm    pt has had AAA screening x 2, most recent was 02/2015.  Marland Kitchen History of migraine    These resolved after menopause  . HTN (hypertension)   . Hyperlipidemia, mixed   . Insomnia    maintenance problems; trazodone no help  . Left rotator cuff tear arthropathy 04/2015   Dr. Charlann Boxer  . Nephrolithiasis 1994    Past Surgical History:  Procedure Laterality Date  . Aortic ultrasound  02/13/13; 02/2015   no aneurism (screening for high risk)  . BUNIONECTOMY  2006   bilat  . carotid dopplers (screening for PAD)  11/16/2008   "minor left carotid dz" per old records.  No hx of TIA/CVA.  Marland Kitchen CESAREAN SECTION  x 2    [place]  . COLONOSCOPY  07/2006   recall 10 yrs.  'tics.  (Dr. Domenica Fail  in [place], [place])  . CYST EXCISION     right index finger, base of fingernail  . kidney stone extraction  1992   Cystoscopy: fragmented ureteral stone.  No procedures for this since then.   Social History   Socioeconomic History  . Marital status: Married    Spouse name: Not on file  . Number of children: Not on file  . Years of education: Not on file  . Highest education level: Not on file  Occupational History  . Not on file  Social Needs  . Financial resource strain: Not on file  . Food insecurity:    Worry: Not on file    Inability: Not on file  . Transportation needs:    Medical: Not on file    Non-medical: Not on file  Tobacco Use  . Smoking status: Never Smoker  . Smokeless tobacco: Never Used  Substance and Sexual Activity  . Alcohol use: Not on file  . Drug use: Not on file  . Sexual activity: Not on file  Lifestyle  . Physical activity:    Days per week: Not on file    Minutes  per session: Not on file  . Stress: Not on file  Relationships  . Social connections:    Talks on phone: Not on file    Gets together: Not on file    Attends religious service: Not on file    Active member of club or organization: Not on file    Attends meetings of clubs or organizations: Not on file    Relationship status: Not on file  Other Topics Concern  . Not on file  Social History Narrative   Married, 2 daughters.  Four older brothers, 3 of which have had abdominal aneurisms.   Relocated from [place] area 05/2013.   Occupation: retired [employer] officer, political party.   No tob, occ alcohol.       Outpatient Medications Prior to Visit  Medication Sig Dispense Refill  . calcium citrate-vitamin D (CITRACAL+D) 315-200 MG-UNIT per tablet Take 1 tablet by mouth daily.    . cetirizine (ZYRTEC) 10 MG tablet Take 10 mg by mouth daily. Allertec    . Cholecalciferol (VITAMIN D) 2000 units tablet Take 2,000 Units by mouth daily.    . fenofibrate 54 MG tablet Take 1 tablet (54 mg total) by  mouth daily. 90 tablet 3  . lisinopril (PRINIVIL,ZESTRIL) 5 MG tablet TAKE 1 TABLET DAILY 90 tablet 3  . Lutein-Zeaxanthin 25-5 MG CAPS Take 1 capsule by mouth daily.    . Multiple Vitamins-Minerals (CENTRUM SILVER PO) Take by mouth.    . Omega-3 Fatty Acids (FISH OIL) 1200 MG CAPS Take 1,200 mg by mouth.    . simvastatin (ZOCOR) 40 MG tablet Take 1 tablet (40 mg total) by mouth daily. 90 tablet 3  . TURMERIC PO Take 400 mg by mouth daily.     No facility-administered medications prior to visit.     Allergies  Allergen Reactions  . Erythromycin Other (See Comments)    Abdominal pain  . Tetracyclines & Related Other (See Comments)    Joint pain    ROS As per HPI  PE: Blood pressure 113/73, pulse 99, temperature 99 F (37.2 C), temperature source Oral, resp. rate 16, height 5\' 5"  (1.651 m), weight 154 lb 2 oz (69.9 kg), SpO2 96 %. Gen: Alert, tired-appearing but non-toxic and in NAD.  Patient is oriented to person, place, time, and situation. AFFECT: pleasant, lucid thought and speech. ENT: Ears: EACs clear, normal epithelium.  TMs with good light reflex and landmarks bilaterally.  Eyes: no injection, icteris, swelling, or exudate.  EOMI, PERRLA. Nose: no drainage or turbinate edema/swelling.  No injection or focal lesion.  Mouth: lips without lesion/swelling.  Oral mucosa pink and moist.  Dentition intact and without obvious caries or gingival swelling.  Oropharynx without erythema, exudate, or swelling. No paranasal sinus tenderness. Neck - No masses or thyromegaly or limitation in range of motion CV: RRR, no m/r/g.   LUNGS: CTA bilat, nonlabored resps, good aeration in all lung fields. ABD: soft, mild suprapubic TTP, otherwise non tender.  No distention, no guarding or rebound.  BS normal.  No HSM, mass, or bruit. EXT: no clubbing or cyanosis.  no edema.  Skin - no sores or suspicious lesions or rashes or color changes Back: no CVA tenderness.  No flank tenderness.   LABS:     Chemistry      Component Value Date/Time   NA 139 06/12/2017 0926   K 4.8 06/12/2017 0926   CL 101 06/12/2017 0926   CO2 29 06/12/2017 0926   BUN 24 06/12/2017 0926  CREATININE 0.91 06/12/2017 0926      Component Value Date/Time   CALCIUM 9.7 06/12/2017 0926   ALKPHOS 44 12/04/2015 0933   AST 22 12/11/2016 0900   ALT 19 12/11/2016 0900   BILITOT 0.4 12/11/2016 0900     CC UA today: 2+ blood, 1+ LEU, otherwise normal  IMPRESSION AND PLAN:  UTI likely. Will treat for possibility of upper urinary tract involvement. Send urine for c/s. Omnicef 300 mg bid x 7d. Signs/symptoms to call or return for were reviewed and pt expressed understanding.  If not improving on omnicef and if constellation of sx's still suggestive of possible tick born dz, will start doxycycline. Tetracycline and related drugs are listed in chart as allergies BUT she had only joint pains on tetracycline, so she is willing to take doxycycline if we had to rx this med for her in the future.  An After Visit Summary was printed and given to the patient.  FOLLOW UP: Return if symptoms worsen or fail to improve in 3d.  Signed:  Crissie Sickles, MD           09/25/2017

## 2017-09-27 LAB — URINE CULTURE
MICRO NUMBER: 91132770
SPECIMEN QUALITY:: ADEQUATE

## 2017-10-06 DIAGNOSIS — R066 Hiccough: Secondary | ICD-10-CM

## 2017-10-06 HISTORY — DX: Hiccough: R06.6

## 2017-10-07 ENCOUNTER — Ambulatory Visit: Payer: Medicare HMO | Admitting: Family Medicine

## 2017-10-07 ENCOUNTER — Encounter: Payer: Self-pay | Admitting: Family Medicine

## 2017-10-07 VITALS — BP 137/78 | HR 124 | Temp 100.3°F | Resp 16 | Ht 65.0 in | Wt 154.0 lb

## 2017-10-07 DIAGNOSIS — N390 Urinary tract infection, site not specified: Secondary | ICD-10-CM

## 2017-10-07 DIAGNOSIS — R5081 Fever presenting with conditions classified elsewhere: Secondary | ICD-10-CM

## 2017-10-07 DIAGNOSIS — N3001 Acute cystitis with hematuria: Secondary | ICD-10-CM

## 2017-10-07 DIAGNOSIS — R350 Frequency of micturition: Secondary | ICD-10-CM

## 2017-10-07 LAB — POCT URINALYSIS DIPSTICK
Bilirubin, UA: NEGATIVE
Glucose, UA: NEGATIVE
KETONES UA: NEGATIVE
Nitrite, UA: NEGATIVE
PH UA: 6 (ref 5.0–8.0)
PROTEIN UA: POSITIVE — AB
SPEC GRAV UA: 1.02 (ref 1.010–1.025)
Urobilinogen, UA: 0.2 E.U./dL

## 2017-10-07 MED ORDER — CEFTRIAXONE SODIUM 1 G IJ SOLR
1.0000 g | Freq: Once | INTRAMUSCULAR | Status: AC
  Administered 2017-10-07: 1 g via INTRAMUSCULAR

## 2017-10-07 MED ORDER — CEFDINIR 300 MG PO CAPS: ORAL_CAPSULE | ORAL | 0 refills | Status: DC

## 2017-10-07 MED ORDER — ONDANSETRON HCL 8 MG PO TABS: 8.0000 mg | ORAL_TABLET | Freq: Three times a day (TID) | ORAL | 0 refills | Status: DC | PRN

## 2017-10-07 NOTE — Progress Notes (Signed)
OFFICE VISIT  10/07/2017   CC:  Chief Complaint  Patient presents with  . Urinary Tract Infection    ?  Marland Kitchen Abdominal Pain    HPI:    Patient is a 72 y.o. Caucasian female who presents for frequent urination. Onset yesterday, urinary frequency, some mild dysuria, +urgency.  Last night had some RLQ abd and R LB pain last evening but this has all gone away now.  Nauseated, has vomited some this morning.  Subj f/c last night.   Slight naggy cough--"not anything new", no SOB.  No CP.  No diarrhea.  A bit of lightheaded feeling when she gets real nauseated, otherwise no dizziness or vertigo.  She had UTI 09/25/17 and felt back to normal after 3d of abx and finished 7d course (omnicef)--klebsiella, sensitive to 3rd gen ceph among others.  Past Medical History:  Diagnosis Date  . Acquired hammertoes of both feet   . Anterior subluxation of right shoulder 07/2017   Relocated by Dr. Tamala Julian  . Cervical disc disorder with radiculopathy    "   "       "      "  . Colon cancer screening    Colonoscopy normal 2008.  Cologuard NEG 06/2016.  Repeat cologuard 06/2019.  . Diverticulosis   . Family history of abdominal aortic aneurysm    pt has had AAA screening x 2, most recent was 02/2015.  Marland Kitchen History of migraine    These resolved after menopause  . HTN (hypertension)   . Hyperlipidemia, mixed   . Insomnia    maintenance problems; trazodone no help  . Left rotator cuff tear arthropathy 04/2015   Dr. Charlann Boxer  . Nephrolithiasis 1994    Past Surgical History:  Procedure Laterality Date  . Aortic ultrasound  02/13/13; 02/2015   no aneurism (screening for high risk)  . BUNIONECTOMY  2006   bilat  . carotid dopplers (screening for PAD)  11/16/2008   "minor left carotid dz" per old records.  No hx of TIA/CVA.  Marland Kitchen CESAREAN SECTION  x 2    [place]  . COLONOSCOPY  07/2006   recall 10 yrs.  'tics.  (Dr. Domenica Fail in [place], [place])  . CYST EXCISION     right index finger, base of fingernail  .  kidney stone extraction  1992   Cystoscopy: fragmented ureteral stone.  No procedures for this since then.    Outpatient Medications Prior to Visit  Medication Sig Dispense Refill  . calcium citrate-vitamin D (CITRACAL+D) 315-200 MG-UNIT per tablet Take 1 tablet by mouth daily.    . cetirizine (ZYRTEC) 10 MG tablet Take 10 mg by mouth daily. Allertec    . Cholecalciferol (VITAMIN D) 2000 units tablet Take 2,000 Units by mouth daily.    . fenofibrate 54 MG tablet Take 1 tablet (54 mg total) by mouth daily. 90 tablet 3  . lisinopril (PRINIVIL,ZESTRIL) 5 MG tablet TAKE 1 TABLET DAILY 90 tablet 3  . Lutein-Zeaxanthin 25-5 MG CAPS Take 1 capsule by mouth daily.    . Multiple Vitamins-Minerals (CENTRUM SILVER PO) Take by mouth.    . Omega-3 Fatty Acids (FISH OIL) 1200 MG CAPS Take 1,200 mg by mouth.    . simvastatin (ZOCOR) 40 MG tablet Take 1 tablet (40 mg total) by mouth daily. 90 tablet 3  . TURMERIC PO Take 400 mg by mouth daily.    . cefdinir (OMNICEF) 300 MG capsule Take 1 capsule (300 mg total) by mouth  2 (two) times daily. (Patient not taking: Reported on 10/07/2017) 14 capsule 0   No facility-administered medications prior to visit.     Allergies  Allergen Reactions  . Erythromycin Other (See Comments)    Abdominal pain  . Tetracyclines & Related Other (See Comments)    Joint pain    ROS As per HPI  PE: Blood pressure 137/78, pulse (!) 124, temperature 100.3 F (37.9 C), temperature source Oral, resp. rate 16, height 5\' 5"  (1.651 m), weight 154 lb (69.9 kg), SpO2 91 %. RA. I repeated pulse ox when I saw her in exam room and it was 95%, pulse 122. Pt examined with Helayne Seminole, CMA, as chaperone.   Gen: Alert, mildly tired- appearing.  NONTOXIC.  Patient is oriented to person, place, time, and situation. AFFECT: pleasant, lucid thought and speech. WLS:LHTD: no injection, icteris, swelling, or exudate.  EOMI, PERRLA. Mouth: lips without lesion/swelling.  Oral mucosa pink  and moist. Oropharynx without erythema, exudate, or swelling.  CV: Regular, tachy, no m/r/g.   LUNGS: CTA bilat, nonlabored resps, good aeration in all lung fields. ABd: soft, non distended, BS a bit hyperactive.  Mild diffuse soreness with palpation everywhere except RLQ (most discomfort is around umbilicus).  No guarding or rebound.  No HSM, mass, or bruit.   LABS:    Chemistry      Component Value Date/Time   NA 139 06/12/2017 0926   K 4.8 06/12/2017 0926   CL 101 06/12/2017 0926   CO2 29 06/12/2017 0926   BUN 24 06/12/2017 0926   CREATININE 0.91 06/12/2017 0926      Component Value Date/Time   CALCIUM 9.7 06/12/2017 0926   ALKPHOS 44 12/04/2015 0933   AST 22 12/11/2016 0900   ALT 19 12/11/2016 0900   BILITOT 0.4 12/11/2016 0900     CC UA today: 3+ Blood, 2+ protein, 2+ LEU, o/w normal   IMPRESSION AND PLAN:  Febrile UTI.  This is her 2nd UTI in the last month.  No sign of pyelonephritis on her exam today. Sent urine for c/s. Rocephin 1g IM in office today. Omnicef 300 mg bid for an additional 4d. Zofran 8mg  tid prn nausea/vomiting.  An After Visit Summary was printed and given to the patient.  FOLLOW UP: Return if symptoms worsen or fail to improve.  Signed:  Crissie Sickles, MD           10/07/2017

## 2017-10-07 NOTE — Addendum Note (Signed)
Addended by: Onalee Hua on: 10/07/2017 12:22 PM   Modules accepted: Orders

## 2017-10-08 LAB — URINE CULTURE
MICRO NUMBER: 91184274
SPECIMEN QUALITY:: ADEQUATE

## 2017-10-11 DIAGNOSIS — K219 Gastro-esophageal reflux disease without esophagitis: Secondary | ICD-10-CM | POA: Diagnosis not present

## 2017-10-11 DIAGNOSIS — D649 Anemia, unspecified: Secondary | ICD-10-CM | POA: Diagnosis not present

## 2017-10-11 DIAGNOSIS — R066 Hiccough: Secondary | ICD-10-CM | POA: Diagnosis not present

## 2017-10-26 ENCOUNTER — Encounter: Payer: Self-pay | Admitting: Family Medicine

## 2017-10-26 ENCOUNTER — Ambulatory Visit: Payer: Medicare HMO | Admitting: Family Medicine

## 2017-10-26 VITALS — BP 121/81 | HR 113 | Temp 97.3°F | Resp 16 | Ht 65.0 in | Wt 147.1 lb

## 2017-10-26 DIAGNOSIS — R5383 Other fatigue: Secondary | ICD-10-CM

## 2017-10-26 DIAGNOSIS — R000 Tachycardia, unspecified: Secondary | ICD-10-CM

## 2017-10-26 DIAGNOSIS — D649 Anemia, unspecified: Secondary | ICD-10-CM | POA: Diagnosis not present

## 2017-10-26 DIAGNOSIS — R066 Hiccough: Secondary | ICD-10-CM

## 2017-10-26 DIAGNOSIS — K219 Gastro-esophageal reflux disease without esophagitis: Secondary | ICD-10-CM

## 2017-10-26 DIAGNOSIS — T50905A Adverse effect of unspecified drugs, medicaments and biological substances, initial encounter: Secondary | ICD-10-CM

## 2017-10-26 NOTE — Progress Notes (Signed)
OFFICE VISIT  10/26/2017   CC:  Chief Complaint  Patient presents with  . Gastroesophageal Reflux    was seen at ER    HPI:    Patient is a 72 y.o. Caucasian female who presents for "reflux and recurrent hiccups".  Of note, pt went to ED 10/11/17 while in [place] and I reviewed the entire record today: Exam unremarkable, CBC showed Hb 10.6 with MCV 88, normal WBC and platelets, CMET showed Cr 1.3 (GFR 40 ml/min),normal mag, normal TnT.  EKG was normal.  Dx was GERD with singultus and she was rx'd PPI and reglan. GI f/u was recommended to r/o malignant causes.  HPI: Onset of mild retrosternal discomfort and hiccups on the plane to [place] starting 10/08/17.  She says the hiccups finally went away after about 7d total (have been gone now about 10d).  At onset and during the time of hiccups, pt denies feeling any SOB, CP, or having any cough. She was on cefdinir at the time, but she had tolerated this med w/out problem before.   Still with poor appetite.  When she eats she denies any dysphagia, odynophagia, heartburn, or abd pain.  Hiccups have not returned.  No excessive burping.  She took the last of her protonix yesterday, has still been taking reglan. She does feel tired/fatigued and this is her primary complaint today.  A dry cough and mild DOE have started [place] she was put on reglan and protonix in [place].  Prior to this episode of hiccups she had not had any problem with GERD or hiccups and was not on any PPI. She does also c/o dry cough.  No excessive throat clearing.  No ST.  She has been on her ACE-I many years w/out dry cough. She is eating fine, drinking fluids well. No fevers, no abd pains, no b/b complaints.  No melena.  She is not currently on any iron supplement.  Past Medical History:  Diagnosis Date  . Acquired hammertoes of both feet   . Anterior subluxation of right shoulder 07/2017   Relocated by Dr. Tamala Julian  . Cervical disc disorder with radiculopathy    "   "       "       "  . Colon cancer screening    Colonoscopy normal 2008.  Cologuard NEG 06/2016.  Repeat cologuard 06/2019.  . Diverticulosis   . Family history of abdominal aortic aneurysm    pt has had AAA screening x 2, most recent was 02/2015.  Marland Kitchen History of migraine    These resolved after menopause  . HTN (hypertension)   . Hyperlipidemia, mixed   . Insomnia    maintenance problems; trazodone no help  . Left rotator cuff tear arthropathy 04/2015   Dr. Charlann Boxer  . Nephrolithiasis 1994    Past Surgical History:  Procedure Laterality Date  . Aortic ultrasound  02/13/13; 02/2015   no aneurism (screening for high risk)  . BUNIONECTOMY  2006   bilat  . carotid dopplers (screening for PAD)  11/16/2008   "minor left carotid dz" per old records.  No hx of TIA/CVA.  Marland Kitchen CESAREAN SECTION  x 2    [place]  . COLONOSCOPY  07/2006   recall 10 yrs.  'tics.  (Dr. Domenica Fail in [place], [place])  . CYST EXCISION     right index finger, base of fingernail  . kidney stone extraction  1992   Cystoscopy: fragmented ureteral stone.  No procedures for this  since then.    Outpatient Medications Prior to Visit  Medication Sig Dispense Refill  . cetirizine (ZYRTEC) 10 MG tablet Take 10 mg by mouth daily. Allertec    . fenofibrate 54 MG tablet Take 1 tablet (54 mg total) by mouth daily. 90 tablet 3  . lisinopril (PRINIVIL,ZESTRIL) 5 MG tablet TAKE 1 TABLET DAILY 90 tablet 3  . metoCLOPramide (REGLAN) 5 MG tablet Take 1 tablet by mouth 4 (four) times daily - after meals and at bedtime.    . pantoprazole (PROTONIX) 40 MG tablet Take 1 tablet by mouth daily.    . simvastatin (ZOCOR) 40 MG tablet Take 1 tablet (40 mg total) by mouth daily. 90 tablet 3  . calcium citrate-vitamin D (CITRACAL+D) 315-200 MG-UNIT per tablet Take 1 tablet by mouth daily.    . Cholecalciferol (VITAMIN D) 2000 units tablet Take 2,000 Units by mouth daily.    . Lutein-Zeaxanthin 25-5 MG CAPS Take 1 capsule by mouth daily.    . Multiple  Vitamins-Minerals (CENTRUM SILVER PO) Take by mouth.    . Omega-3 Fatty Acids (FISH OIL) 1200 MG CAPS Take 1,200 mg by mouth.    . ondansetron (ZOFRAN) 8 MG tablet Take 1 tablet (8 mg total) by mouth every 8 (eight) hours as needed for nausea or vomiting. (Patient not taking: Reported on 10/26/2017) 20 tablet 0  . TURMERIC PO Take 400 mg by mouth daily.    . cefdinir (OMNICEF) 300 MG capsule 1 tab po bid x 10d (Patient not taking: Reported on 10/26/2017) 20 capsule 0   No facility-administered medications prior to visit.     Allergies  Allergen Reactions  . Erythromycin Other (See Comments)    Abdominal pain  . Tetracyclines & Related Other (See Comments)    Joint pain    ROS As per HPI  PE: Blood pressure 121/81, pulse (!) 113, temperature (!) 97.3 F (36.3 C), temperature source Oral, resp. rate 16, height 5\' 5"  (1.651 m), weight 147 lb 2 oz (66.7 kg), SpO2 100 %. Gen: Alert, well appearing.  Patient is oriented to person, place, time, and situation. AFFECT: pleasant, lucid thought and speech. KGM:WNUU: no injection, icteris, swelling, or exudate.  EOMI, PERRLA. Mouth: lips without lesion/swelling.  Oral mucosa pink and moist. Oropharynx without erythema, exudate, or swelling.  Neck - No masses or thyromegaly or limitation in range of motion CV: Regular rhythm tachy to 120, no m/r/g.   LUNGS: CTA bilat, nonlabored resps, good aeration in all lung fields. ABD: soft, NT, ND, BS normal.  No hepatospenomegaly or mass.  No bruits. EXT: no clubbing or cyanosis.  no edema.    LABS:    Chemistry      Component Value Date/Time   NA 139 06/12/2017 0926   K 4.8 06/12/2017 0926   CL 101 06/12/2017 0926   CO2 29 06/12/2017 0926   BUN 24 06/12/2017 0926   CREATININE 0.91 06/12/2017 0926      Component Value Date/Time   CALCIUM 9.7 06/12/2017 0926   ALKPHOS 44 12/04/2015 0933   AST 22 12/11/2016 0900   ALT 19 12/11/2016 0900   BILITOT 0.4 12/11/2016 0900     Lab Results   Component Value Date   WBC 6.1 12/20/2013   HGB 15.1 (H) 12/20/2013   HCT 46.0 12/20/2013   MCV 92.0 12/20/2013   PLT 274.0 12/20/2013   Lab Results  Component Value Date   TSH 3.55 12/04/2015    IMPRESSION AND PLAN:  1) Hiccups,  prolonged.  Suspected to be secondary to GER. Resolved with reglan and pantoprazole.  2) Fatigue and mild DOE + tachycardia: we discussed this extensively today and she is absolutely sure that these sx's did not start until after she started taking reglan and pantoprazole.  I suspect med side effects--reglan is the culprit. Stop reglan. Ween off PPI (she is out of pantoprazole): take otc prilosec 20mg  qd x 3d, then 20mg  qod x 3 doses, then stop. BMET, CBC, TSH today.  3) Normocytic anemia, mild. No overt sign of bleeding. Recheck CBC, check iron panel, hemoccults x 3.  Spent 45 min with pt today, with >50% of this time spent in counseling and care coordination regarding the above problems.  An After Visit Summary was printed and given to the patient.  FOLLOW UP:  F/u 10-14 d --GER/tachy/med side effect/anemia  Signed:  Crissie Sickles, MD           10/26/2017

## 2017-10-26 NOTE — Patient Instructions (Signed)
Take generic over the counter prilosec (20mg ) : 1 daily for 3d, then 1 every other day for 3 doses, then stop.  Stop Reglan (metaclopromide).

## 2017-10-27 LAB — CBC WITH DIFFERENTIAL/PLATELET
BASOS PCT: 0.8 %
Basophils Absolute: 66 cells/uL (ref 0–200)
Eosinophils Absolute: 100 cells/uL (ref 15–500)
Eosinophils Relative: 1.2 %
HCT: 35.1 % (ref 35.0–45.0)
Hemoglobin: 11.9 g/dL (ref 11.7–15.5)
Lymphs Abs: 1834 cells/uL (ref 850–3900)
MCH: 29.1 pg (ref 27.0–33.0)
MCHC: 33.9 g/dL (ref 32.0–36.0)
MCV: 85.8 fL (ref 80.0–100.0)
MONOS PCT: 8.2 %
MPV: 9.7 fL (ref 7.5–12.5)
NEUTROS PCT: 67.7 %
Neutro Abs: 5619 cells/uL (ref 1500–7800)
PLATELETS: 363 10*3/uL (ref 140–400)
RBC: 4.09 10*6/uL (ref 3.80–5.10)
RDW: 12.5 % (ref 11.0–15.0)
TOTAL LYMPHOCYTE: 22.1 %
WBC mixed population: 681 cells/uL (ref 200–950)
WBC: 8.3 10*3/uL (ref 3.8–10.8)

## 2017-10-27 LAB — BASIC METABOLIC PANEL
BUN/Creatinine Ratio: 19 (calc) (ref 6–22)
BUN: 25 mg/dL (ref 7–25)
CALCIUM: 9.7 mg/dL (ref 8.6–10.4)
CHLORIDE: 103 mmol/L (ref 98–110)
CO2: 22 mmol/L (ref 20–32)
Creat: 1.29 mg/dL — ABNORMAL HIGH (ref 0.60–0.93)
Glucose, Bld: 128 mg/dL — ABNORMAL HIGH (ref 65–99)
Potassium: 4.1 mmol/L (ref 3.5–5.3)
Sodium: 139 mmol/L (ref 135–146)

## 2017-10-27 LAB — VITAMIN B12: VITAMIN B 12: 852 pg/mL (ref 200–1100)

## 2017-10-27 LAB — IRON AND TIBC
IRON SATURATION: 14 % — AB (ref 15–55)
IRON: 47 ug/dL (ref 27–139)
Total Iron Binding Capacity: 328 ug/dL (ref 250–450)
UIBC: 281 ug/dL (ref 118–369)

## 2017-10-27 LAB — IRON: IRON: 49 ug/dL (ref 45–160)

## 2017-10-27 LAB — TSH: TSH: 2.32 m[IU]/L (ref 0.40–4.50)

## 2017-10-27 LAB — FERRITIN: Ferritin: 532 ng/mL — ABNORMAL HIGH (ref 16–288)

## 2017-10-29 ENCOUNTER — Encounter: Payer: Self-pay | Admitting: Family Medicine

## 2017-11-03 ENCOUNTER — Other Ambulatory Visit: Payer: Medicare HMO

## 2017-11-03 DIAGNOSIS — R000 Tachycardia, unspecified: Secondary | ICD-10-CM

## 2017-11-03 DIAGNOSIS — D649 Anemia, unspecified: Secondary | ICD-10-CM

## 2017-11-03 LAB — HEMOCCULT SLIDES (X 3 CARDS)
FECAL OCCULT BLD: NEGATIVE
OCCULT 1: NEGATIVE
OCCULT 2: NEGATIVE
OCCULT 3: NEGATIVE
OCCULT 4: NEGATIVE
OCCULT 5: NEGATIVE

## 2017-11-06 ENCOUNTER — Encounter: Payer: Self-pay | Admitting: Family Medicine

## 2017-11-06 ENCOUNTER — Ambulatory Visit: Payer: Medicare HMO | Admitting: Family Medicine

## 2017-11-06 VITALS — BP 112/70 | HR 86 | Resp 16 | Ht 65.0 in | Wt 144.0 lb

## 2017-11-06 DIAGNOSIS — R7989 Other specified abnormal findings of blood chemistry: Secondary | ICD-10-CM

## 2017-11-06 DIAGNOSIS — K219 Gastro-esophageal reflux disease without esophagitis: Secondary | ICD-10-CM

## 2017-11-06 DIAGNOSIS — D649 Anemia, unspecified: Secondary | ICD-10-CM

## 2017-11-06 DIAGNOSIS — R066 Hiccough: Secondary | ICD-10-CM | POA: Diagnosis not present

## 2017-11-06 DIAGNOSIS — K5710 Diverticulosis of small intestine without perforation or abscess without bleeding: Secondary | ICD-10-CM

## 2017-11-06 DIAGNOSIS — T50905D Adverse effect of unspecified drugs, medicaments and biological substances, subsequent encounter: Secondary | ICD-10-CM

## 2017-11-06 HISTORY — DX: Diverticulosis of small intestine without perforation or abscess without bleeding: K57.10

## 2017-11-06 NOTE — Progress Notes (Signed)
OFFICE VISIT  11/06/2017   CC:  Chief Complaint  Patient presents with  . Follow-up    reflux, tachycardia   HPI:    Patient is a 72 y.o. Caucasian female who presents for 10 day f/u h/o intractable hiccups--> suspected GERD as etiology. Also f/u med side effects from reglan that the ED had rx'd her for hiccups-->DOE, fatigue, tachycardia.  Plan was for her to stop reglan, ween off PPI. Last visit we repeated CBC and CMET:  normal except Cr 1.29---similar to what it was on her labs in the ED in [place] a couple of weeks ago.  Also, her ferritin was elevated at 539.  Her Hb was 11.9, normal MCV on this recheck. I recommended she push fluids and had her stop her lisinopril 5mg  qd for now. Patient is only taking fenofibrate, simvastatin, and zyrtec at this time.  Interim Hx: Feeling much improved!  Energy level much improved, getting enough sleep. No hiccups, no GER.  HR has gradually come down into 70s-80s. SOB much improved.  Can walk up her stairs w/out stopping to rest. HOme bp normal after stopping lisinopril.  Has been trying to increase water intake.  No caffeine.  Still with dry cough, clears throat quite a bit, feels some ? PND.  This problem is no worse and no better.  No hemoptysis or chest pain.  No abnl wt loss.  No abd pain.  No LE swelling.  No dysphagia or ST.  Past Medical History:  Diagnosis Date  . Acquired hammertoes of both feet   . Anterior subluxation of right shoulder 07/2017   Relocated by Dr. Tamala Julian  . Cervical disc disorder with radiculopathy    "   "       "      "  . Colon cancer screening    Colonoscopy normal 2008.  Cologuard NEG 06/2016.  Repeat cologuard 06/2019.  . Diverticulosis   . Family history of abdominal aortic aneurysm    pt has had AAA screening x 2, most recent was 02/2015.  Marland Kitchen History of migraine    These resolved after menopause  . HTN (hypertension)   . Hyperlipidemia, mixed   . Insomnia    maintenance problems; trazodone no help  .  Intractable hiccups 10/2017   resolved with PPI and reglan  . Left rotator cuff tear arthropathy 04/2015   Dr. Charlann Boxer  . Nephrolithiasis 1994    Past Surgical History:  Procedure Laterality Date  . Aortic ultrasound  02/13/13; 02/2015   no aneurism (screening for high risk)  . BUNIONECTOMY  2006   bilat  . carotid dopplers (screening for PAD)  11/16/2008   "minor left carotid dz" per old records.  No hx of TIA/CVA.  Marland Kitchen CESAREAN SECTION  x 2    [place]  . COLONOSCOPY  07/2006   recall 10 yrs.  'tics.  (Dr. Domenica Fail in [place], [place])  . CYST EXCISION     right index finger, base of fingernail  . kidney stone extraction  1992   Cystoscopy: fragmented ureteral stone.  No procedures for this since then.    Outpatient Medications Prior to Visit  Medication Sig Dispense Refill  . cetirizine (ZYRTEC) 10 MG tablet Take 10 mg by mouth daily. Allertec    . fenofibrate 54 MG tablet Take 1 tablet (54 mg total) by mouth daily. 90 tablet 3  . simvastatin (ZOCOR) 40 MG tablet Take 1 tablet (40 mg total) by mouth daily.  90 tablet 3  . calcium citrate-vitamin D (CITRACAL+D) 315-200 MG-UNIT per tablet Take 1 tablet by mouth daily.    . Cholecalciferol (VITAMIN D) 2000 units tablet Take 2,000 Units by mouth daily.    . Multiple Vitamins-Minerals (CENTRUM SILVER PO) Take by mouth.    Marland Kitchen lisinopril (PRINIVIL,ZESTRIL) 5 MG tablet TAKE 1 TABLET DAILY (Patient not taking: Reported on 11/06/2017) 90 tablet 3  . Lutein-Zeaxanthin 25-5 MG CAPS Take 1 capsule by mouth daily.    . Omega-3 Fatty Acids (FISH OIL) 1200 MG CAPS Take 1,200 mg by mouth.    . ondansetron (ZOFRAN) 8 MG tablet Take 1 tablet (8 mg total) by mouth every 8 (eight) hours as needed for nausea or vomiting. (Patient not taking: Reported on 10/26/2017) 20 tablet 0  . TURMERIC PO Take 400 mg by mouth daily.     No facility-administered medications prior to visit.     Allergies  Allergen Reactions  . Metoclopramide Shortness Of Breath     SOB, inc HR, nausea, fatigue  . Erythromycin Other (See Comments)    Abdominal pain  . Tetracyclines & Related Other (See Comments)    Joint pain    ROS As per HPI  PE: Blood pressure 112/70, pulse 86, resp. rate 16, height 5\' 5"  (1.651 m), weight 144 lb (65.3 kg), SpO2 96 %. Body mass index is 23.96 kg/m.  Gen: Alert, well appearing.  Patient is oriented to person, place, time, and situation. AFFECT: pleasant, lucid thought and speech. JME:QAST: no injection, icteris, swelling, or exudate.  EOMI, PERRLA. Mouth: lips without lesion/swelling.  Oral mucosa pink and moist. Oropharynx without erythema, exudate, or swelling.  Neck - No masses or thyromegaly or limitation in range of motion CV: RRR, no m/r/g.   LUNGS: CTA bilat, nonlabored resps, good aeration in all lung fields. EXT: no clubbing or cyanosis.  no edema.  SKIN: no pallor, jaundice, or rash. No tremulousness.  LABS:    Chemistry      Component Value Date/Time   NA 139 10/26/2017 1535   K 4.1 10/26/2017 1535   CL 103 10/26/2017 1535   CO2 22 10/26/2017 1535   BUN 25 10/26/2017 1535   CREATININE 1.29 (H) 10/26/2017 1535      Component Value Date/Time   CALCIUM 9.7 10/26/2017 1535   ALKPHOS 44 12/04/2015 0933   AST 22 12/11/2016 0900   ALT 19 12/11/2016 0900   BILITOT 0.4 12/11/2016 0900     Lab Results  Component Value Date   WBC 8.3 10/26/2017   HGB 11.9 10/26/2017   HCT 35.1 10/26/2017   MCV 85.8 10/26/2017   PLT 363 10/26/2017   Lab Results  Component Value Date   VITAMINB12 852 10/26/2017   Lab Results  Component Value Date   IRON 49 10/26/2017   IRON 47 10/26/2017   TIBC 328 10/26/2017   FERRITIN 532 (H) 10/26/2017   Hemoccults x 3 NEG 11/03/17  Lab Results  Component Value Date   [id number] 892 10/26/2017    IMPRESSION AND PLAN:  1) Hiccups, secondary to GERD: completely resolved. She is off PPI and reglan.  2) Intolerant of reglan/side effects-->malaise, fatigue, tachycardia,  SOB. All resolved since d/c of reglan.  3) AKI: she has stopped lisinopril, hydrated better. Recheck BMET today.  4) Elevated ferritin: likely acute phase reactant. Recheck ferritin today.  5) Mild normocytic anemia: resolved.  Hemoccults neg x 3, iron levels normal, vit B12 normal.  An After Visit Summary was printed and  given to the patient.  FOLLOW UP: Return for keep appt already set with me for 12/14/17.  Signed:  Crissie Sickles, MD           11/06/2017

## 2017-11-07 LAB — BASIC METABOLIC PANEL
BUN / CREAT RATIO: 18 (calc) (ref 6–22)
BUN: 23 mg/dL (ref 7–25)
CHLORIDE: 103 mmol/L (ref 98–110)
CO2: 27 mmol/L (ref 20–32)
CREATININE: 1.29 mg/dL — AB (ref 0.60–0.93)
Calcium: 10 mg/dL (ref 8.6–10.4)
Glucose, Bld: 95 mg/dL (ref 65–99)
Potassium: 4.5 mmol/L (ref 3.5–5.3)
Sodium: 139 mmol/L (ref 135–146)

## 2017-11-07 LAB — FERRITIN: FERRITIN: 377 ng/mL — AB (ref 16–288)

## 2017-11-07 LAB — SEDIMENTATION RATE: Sed Rate: 25 mm/h (ref 0–30)

## 2017-11-08 ENCOUNTER — Other Ambulatory Visit: Payer: Self-pay | Admitting: Family Medicine

## 2017-11-08 DIAGNOSIS — R7989 Other specified abnormal findings of blood chemistry: Secondary | ICD-10-CM

## 2017-11-10 ENCOUNTER — Other Ambulatory Visit (INDEPENDENT_AMBULATORY_CARE_PROVIDER_SITE_OTHER): Payer: Medicare HMO

## 2017-11-10 DIAGNOSIS — R7989 Other specified abnormal findings of blood chemistry: Secondary | ICD-10-CM

## 2017-11-11 ENCOUNTER — Ambulatory Visit (HOSPITAL_BASED_OUTPATIENT_CLINIC_OR_DEPARTMENT_OTHER)
Admission: RE | Admit: 2017-11-11 | Discharge: 2017-11-11 | Disposition: A | Discharge status: Home / Self Care | Payer: Medicare HMO | Source: Ambulatory Visit | Attending: Family Medicine | Admitting: Family Medicine

## 2017-11-11 DIAGNOSIS — R7989 Other specified abnormal findings of blood chemistry: Secondary | ICD-10-CM | POA: Diagnosis not present

## 2017-11-11 DIAGNOSIS — N2889 Other specified disorders of kidney and ureter: Secondary | ICD-10-CM | POA: Insufficient documentation

## 2017-11-11 DIAGNOSIS — N1330 Unspecified hydronephrosis: Secondary | ICD-10-CM | POA: Insufficient documentation

## 2017-11-11 DIAGNOSIS — N1339 Other hydronephrosis: Secondary | ICD-10-CM | POA: Diagnosis not present

## 2017-11-11 LAB — MICROALBUMIN / CREATININE URINE RATIO
CREATININE, URINE: 30 mg/dL (ref 20–275)
MICROALB/CREAT RATIO: 17 ug/mg{creat} (ref <30)
Microalb, Ur: 0.5 mg/dL

## 2017-11-12 ENCOUNTER — Other Ambulatory Visit: Payer: Self-pay | Admitting: Family Medicine

## 2017-11-12 ENCOUNTER — Encounter: Payer: Self-pay | Admitting: Family Medicine

## 2017-11-12 DIAGNOSIS — N281 Cyst of kidney, acquired: Secondary | ICD-10-CM

## 2017-11-12 DIAGNOSIS — Q619 Cystic kidney disease, unspecified: Secondary | ICD-10-CM

## 2017-11-12 HISTORY — DX: Cyst of kidney, acquired: N28.1

## 2017-11-21 ENCOUNTER — Ambulatory Visit (HOSPITAL_BASED_OUTPATIENT_CLINIC_OR_DEPARTMENT_OTHER)
Admission: RE | Admit: 2017-11-21 | Discharge: 2017-11-21 | Disposition: A | Discharge status: Home / Self Care | Payer: Medicare HMO | Source: Ambulatory Visit | Attending: Family Medicine | Admitting: Family Medicine

## 2017-11-21 DIAGNOSIS — K862 Cyst of pancreas: Secondary | ICD-10-CM | POA: Diagnosis not present

## 2017-11-21 DIAGNOSIS — Q619 Cystic kidney disease, unspecified: Secondary | ICD-10-CM | POA: Diagnosis present

## 2017-11-21 DIAGNOSIS — R933 Abnormal findings on diagnostic imaging of other parts of digestive tract: Secondary | ICD-10-CM | POA: Diagnosis not present

## 2017-11-21 DIAGNOSIS — N281 Cyst of kidney, acquired: Secondary | ICD-10-CM

## 2017-11-21 MED ORDER — GADOBENATE DIMEGLUMINE 529 MG/ML IV SOLN
10.0000 mL | Freq: Once | INTRAVENOUS | Status: AC | PRN
  Administered 2017-11-21: 7 mL via INTRAVENOUS

## 2017-11-22 ENCOUNTER — Encounter: Payer: Self-pay | Admitting: Family Medicine

## 2017-11-26 ENCOUNTER — Encounter: Payer: Self-pay | Admitting: Family Medicine

## 2017-11-30 ENCOUNTER — Encounter: Payer: Self-pay | Admitting: Family Medicine

## 2017-11-30 DIAGNOSIS — R5383 Other fatigue: Secondary | ICD-10-CM | POA: Insufficient documentation

## 2017-12-14 ENCOUNTER — Encounter: Payer: Self-pay | Admitting: Family Medicine

## 2017-12-14 ENCOUNTER — Other Ambulatory Visit: Payer: Self-pay

## 2017-12-14 ENCOUNTER — Ambulatory Visit (INDEPENDENT_AMBULATORY_CARE_PROVIDER_SITE_OTHER): Payer: Medicare HMO | Admitting: Family Medicine

## 2017-12-14 ENCOUNTER — Ambulatory Visit (INDEPENDENT_AMBULATORY_CARE_PROVIDER_SITE_OTHER): Payer: Medicare HMO

## 2017-12-14 VITALS — BP 118/70 | HR 69 | Temp 97.5°F | Resp 16 | Ht 65.0 in | Wt 148.4 lb

## 2017-12-14 VITALS — BP 118/70 | HR 69 | Resp 16 | Ht 65.0 in | Wt 148.4 lb

## 2017-12-14 DIAGNOSIS — E2839 Other primary ovarian failure: Secondary | ICD-10-CM

## 2017-12-14 DIAGNOSIS — I10 Essential (primary) hypertension: Secondary | ICD-10-CM

## 2017-12-14 DIAGNOSIS — Z23 Encounter for immunization: Secondary | ICD-10-CM

## 2017-12-14 DIAGNOSIS — Z0000 Encounter for general adult medical examination without abnormal findings: Secondary | ICD-10-CM

## 2017-12-14 DIAGNOSIS — K8689 Other specified diseases of pancreas: Secondary | ICD-10-CM | POA: Diagnosis not present

## 2017-12-14 DIAGNOSIS — R7989 Other specified abnormal findings of blood chemistry: Secondary | ICD-10-CM

## 2017-12-14 DIAGNOSIS — K862 Cyst of pancreas: Secondary | ICD-10-CM

## 2017-12-14 DIAGNOSIS — E782 Mixed hyperlipidemia: Secondary | ICD-10-CM | POA: Diagnosis not present

## 2017-12-14 DIAGNOSIS — Z87898 Personal history of other specified conditions: Secondary | ICD-10-CM

## 2017-12-14 DIAGNOSIS — R05 Cough: Secondary | ICD-10-CM

## 2017-12-14 DIAGNOSIS — R058 Other specified cough: Secondary | ICD-10-CM

## 2017-12-14 LAB — COMPREHENSIVE METABOLIC PANEL
ALK PHOS: 43 U/L (ref 39–117)
ALT: 24 U/L (ref 0–35)
AST: 25 U/L (ref 0–37)
Albumin: 4.7 g/dL (ref 3.5–5.2)
BUN: 18 mg/dL (ref 6–23)
CO2: 32 mEq/L (ref 19–32)
CREATININE: 0.88 mg/dL (ref 0.40–1.20)
Calcium: 10 mg/dL (ref 8.4–10.5)
Chloride: 101 mEq/L (ref 96–112)
GFR: 67.08 mL/min (ref >60.00)
GLUCOSE: 81 mg/dL (ref 70–99)
Potassium: 4.2 mEq/L (ref 3.5–5.1)
SODIUM: 139 meq/L (ref 135–145)
TOTAL PROTEIN: 8 g/dL (ref 6.0–8.3)
Total Bilirubin: 0.4 mg/dL (ref 0.2–1.2)

## 2017-12-14 LAB — LIPID PANEL
CHOL/HDL RATIO: 3
Cholesterol: 143 mg/dL (ref 0–200)
HDL: 41.5 mg/dL (ref >39.00)
LDL Cholesterol: 70 mg/dL (ref 0–99)
NONHDL: 101.84
Triglycerides: 161 mg/dL — ABNORMAL HIGH (ref 0.0–149.0)
VLDL: 32.2 mg/dL (ref 0.0–40.0)

## 2017-12-14 MED ORDER — ZOSTER VAC RECOMB ADJUVANTED 50 MCG/0.5ML IM SUSR: 0.5000 mL | Freq: Once | INTRAMUSCULAR | 1 refills | Status: DC

## 2017-12-14 NOTE — Progress Notes (Signed)
OFFICE VISIT  12/14/2017   CC:  Chief Complaint  Patient presents with  . Follow-up    RCI, pt is fasting.      HPI:    Patient is a 72 y.o. Caucasian female who presents for 6 mo f/u mixed hyperlipidemia, hx of HTN (on no meds at this time), recent sCr elevation, and recent incidental discovery of pancreatic cyst (not clear).   She got her annual wellness exam today with Maudie Mercury, our health coach. Shingrix planned via pharmacy. DEXA ordered.  Hx mixed hyperlipidemia: tolerating statin and fibrate.  Diet not good lately.  Elevated sCr:  Last two checks of sCr were mildly elevated, both done in the context of ACE-I med and mild illness with possible less than adequate PO fluid intake.  sCr stable on f/u check 11/06/17.  Urine microalb/cr normal.  Renal u/s with mild bilat hydronephrosis, polycystic kidneys-->f/u MRI abd  Confirmed multiple benign-appearing cysts in each kidney.  Interim Hx: Cough has been gone! BP's 140/80s average the last 1 mo or so-->she states she has NOT been eating healthy at all lately. Highest systolic 301, highest diastolic 92.  Pancreatic cyst with mild pancreatic duct dilatation-->plan for f/u MR abd 03/2018 discussed today.  Gets annual exam with GYN MD: pelvic normal per pt report.  Past Medical History:  Diagnosis Date  . Acquired hammertoes of both feet   . Anterior subluxation of right shoulder 07/2017   Relocated by Dr. Tamala Julian  . Cervical disc disorder with radiculopathy    "   "       "      "  . Colon cancer screening    Colonoscopy normal 2008.  Cologuard NEG 06/2016.  Repeat cologuard 06/2019.  . Diverticulosis   . Family history of abdominal aortic aneurysm    pt has had AAA screening x 2, most recent was 02/2015.  Marland Kitchen History of migraine    These resolved after menopause  . HTN (hypertension)   . Hyperlipidemia, mixed   . Insomnia    maintenance problems; trazodone no help  . Intractable hiccups 10/2017   resolved with PPI and reglan  .  Left rotator cuff tear arthropathy 04/2015   Dr. Charlann Boxer  . Nephrolithiasis 1994  . Pancreatic cyst 11/2017   vs duodenal diverticulum partially obstructing ampulla??  Mild dilatation of CBD and main pancreatic duct.  Radiologist recommends repeat MR abd 3-6 mo-->pt agreeable to this plan.  . Renal cysts, acquired, bilateral 11/12/2017   R kidney with 2.4x 2.4x 2.4cm complex cyst-->MR abd-->Bosniak 1 and 2, no worrisome renal lesion.  Extrahepatic bile duce and pancreatic duct dilatation + cyst in pancreatic head-->plan repeat MR abd w wo 3-6 mo.    Past Surgical History:  Procedure Laterality Date  . Aortic ultrasound  02/13/13; 02/2015   no aneurism (screening for high risk)  . BUNIONECTOMY  2006   bilat  . carotid dopplers (screening for PAD)  11/16/2008   "minor left carotid dz" per old records.  No hx of TIA/CVA.  Marland Kitchen CESAREAN SECTION  x 2    [place]  . COLONOSCOPY  07/2006   recall 10 yrs.  'tics.  (Dr. Domenica Fail in [place], [place])  . CYST EXCISION     right index finger, base of fingernail  . kidney stone extraction  1992   Cystoscopy: fragmented ureteral stone.  No procedures for this since then.    Outpatient Medications Prior to Visit  Medication Sig Dispense Refill  .  fenofibrate 54 MG tablet Take 1 tablet (54 mg total) by mouth daily. 90 tablet 3  . simvastatin (ZOCOR) 40 MG tablet Take 1 tablet (40 mg total) by mouth daily. 90 tablet 3  . Zoster Vaccine Adjuvanted [facility]) injection Inject 0.5 mLs into the muscle once for 1 dose. 1 each 1  . calcium citrate-vitamin D (CITRACAL+D) 315-200 MG-UNIT per tablet Take 1 tablet by mouth daily.    . cetirizine (ZYRTEC) 10 MG tablet Take 10 mg by mouth daily. Allertec    . Cholecalciferol (VITAMIN D) 2000 units tablet Take 2,000 Units by mouth daily.    . Multiple Vitamins-Minerals (CENTRUM SILVER PO) Take by mouth.     No facility-administered medications prior to visit.     Allergies  Allergen Reactions  . Metoclopramide  Shortness Of Breath    SOB, inc HR, nausea, fatigue  . Erythromycin Other (See Comments)    Abdominal pain  . Tetracyclines & Related Other (See Comments)    Joint pain    ROS As per HPI  PE: Blood pressure 118/70, pulse 69, temperature (!) 97.5 F (36.4 C), temperature source Oral, resp. rate 16, height 5\' 5"  (1.651 m), weight 148 lb 6 oz (67.3 kg), SpO2 99 %. Body mass index is 24.69 kg/m.  Gen: Alert, well appearing.  Patient is oriented to person, place, time, and situation. AFFECT: pleasant, lucid thought and speech. No further exam today.  LABS:  Lab Results  Component Value Date   TSH 2.32 10/26/2017   Lab Results  Component Value Date   WBC 8.3 10/26/2017   HGB 11.9 10/26/2017   HCT 35.1 10/26/2017   MCV 85.8 10/26/2017   PLT 363 10/26/2017   Lab Results  Component Value Date   CREATININE 1.29 (H) 11/06/2017   BUN 23 11/06/2017   NA 139 11/06/2017   K 4.5 11/06/2017   CL 103 11/06/2017   CO2 27 11/06/2017   Lab Results  Component Value Date   ALT 19 12/11/2016   AST 22 12/11/2016   ALKPHOS 44 12/04/2015   BILITOT 0.4 12/11/2016   Lab Results  Component Value Date   CHOL 163 06/12/2017   Lab Results  Component Value Date   HDL 44 (L) 06/12/2017   Lab Results  Component Value Date   LDLCALC 94 06/12/2017   Lab Results  Component Value Date   TRIG 149 06/12/2017   Lab Results  Component Value Date   CHOLHDL 3.7 06/12/2017     IMPRESSION AND PLAN:  1) HTN: The current medical regimen is effective;  continue present plan and medications. Lytes/cr today.    2) Hypercholesterolemia, mixed: tolerating fibrate and statin. Working on diet on/off. FLP today. Hepatic panel today.  3) Elevated sCr: unclear etiology except possibly use of ACE-I in the context of mild dehydration. Recheck was the same.  Her renal u/s showed mild bilat and mild polycystic (benign appearing ) renal cysts.  Urine microalb/cr normal.  Repeat sCr today and if still  the same, will refer to nephrology for further expert evaluation. Watch bp at home with efforts at dietary improvement.  No new bp med yet (avoid ACE or ARBs due to recent probs with elevated sCr and with dry cough (which resolved.  3) Upper airway cough--resolved.    4)  Incidentally-discovered cystic lesion in head of pancreas, with mild dilatation of main pancreatic duct.  Radiologist not sure if this is a benign duodenal diverticulum vs actual pancreatic cyst/mass->plan to f/u with repeat  MR abd 03/2018-ordered today.  An After Visit Summary was printed and given to the patient.  FOLLOW UP: 6 mo CPE  Signed:  Crissie Sickles, MD           12/14/2017

## 2017-12-14 NOTE — Patient Instructions (Addendum)
Shingles vaccine at pharmacy.   Schedule bone scan with mammogram in July 2020.   Bring a copy of your living will and/or healthcare power of attorney to your next office visit.  Continue doing brain stimulating activities (puzzles, reading, adult coloring books, staying active) to keep memory sharp.    Health Maintenance, Female Adopting a healthy lifestyle and getting preventive care can go a long way to promote health and wellness. Talk with your health care provider about what schedule of regular examinations is right for you. This is a good chance for you to check in with your provider about disease prevention and staying healthy. In between checkups, there are plenty of things you can do on your own. Experts have done a lot of research about which lifestyle changes and preventive measures are most likely to keep you healthy. Ask your health care provider for more information. Weight and diet Eat a healthy diet  Be sure to include plenty of vegetables, fruits, low-fat dairy products, and lean protein.  Do not eat a lot of foods high in solid fats, added sugars, or salt.  Get regular exercise. This is one of the most important things you can do for your health. ? Most adults should exercise for at least 150 minutes each week. The exercise should increase your heart rate and make you sweat (moderate-intensity exercise). ? Most adults should also do strengthening exercises at least twice a week. This is in addition to the moderate-intensity exercise.  Maintain a healthy weight  Body mass index (BMI) is a measurement that can be used to identify possible weight problems. It estimates body fat based on height and weight. Your health care provider can help determine your BMI and help you achieve or maintain a healthy weight.  For females 51 years of age and older: ? A BMI below 18.5 is considered underweight. ? A BMI of 18.5 to 24.9 is normal. ? A BMI of 25 to 29.9 is considered  overweight. ? A BMI of 30 and above is considered obese.  Watch levels of cholesterol and blood lipids  You should start having your blood tested for lipids and cholesterol at 72 years of age, then have this test every 5 years.  You may need to have your cholesterol levels checked more often if: ? Your lipid or cholesterol levels are high. ? You are older than 72 years of age. ? You are at high risk for heart disease.  Cancer screening Lung Cancer  Lung cancer screening is recommended for adults 77-34 years old who are at high risk for lung cancer because of a history of smoking.  A yearly low-dose CT scan of the lungs is recommended for people who: ? Currently smoke. ? Have quit within the past 15 years. ? Have at least a 30-pack-year history of smoking. A pack year is smoking an average of one pack of cigarettes a day for 1 year.  Yearly screening should continue until it has been 15 years since you quit.  Yearly screening should stop if you develop a health problem that would prevent you from having lung cancer treatment.  Breast Cancer  Practice breast self-awareness. This means understanding how your breasts normally appear and feel.  It also means doing regular breast self-exams. Let your health care provider know about any changes, no matter how small.  If you are in your 20s or 30s, you should have a clinical breast exam (CBE) by a health care provider every 1-3 years as  part of a regular health exam.  If you are 40 or older, have a CBE every year. Also consider having a breast X-ray (mammogram) every year.  If you have a family history of breast cancer, talk to your health care provider about genetic screening.  If you are at high risk for breast cancer, talk to your health care provider about having an MRI and a mammogram every year.  Breast cancer gene (BRCA) assessment is recommended for women who have family members with BRCA-related cancers. BRCA-related cancers  include: ? Breast. ? Ovarian. ? Tubal. ? Peritoneal cancers.  Results of the assessment will determine the need for genetic counseling and BRCA1 and BRCA2 testing.  Cervical Cancer Your health care provider may recommend that you be screened regularly for cancer of the pelvic organs (ovaries, uterus, and vagina). This screening involves a pelvic examination, including checking for microscopic changes to the surface of your cervix (Pap test). You may be encouraged to have this screening done every 3 years, beginning at age 66.  For women ages 23-65, health care providers may recommend pelvic exams and Pap testing every 3 years, or they may recommend the Pap and pelvic exam, combined with testing for human papilloma virus (HPV), every 5 years. Some types of HPV increase your risk of cervical cancer. Testing for HPV may also be done on women of any age with unclear Pap test results.  Other health care providers may not recommend any screening for nonpregnant women who are considered low risk for pelvic cancer and who do not have symptoms. Ask your health care provider if a screening pelvic exam is right for you.  If you have had past treatment for cervical cancer or a condition that could lead to cancer, you need Pap tests and screening for cancer for at least 20 years after your treatment. If Pap tests have been discontinued, your risk factors (such as having a new sexual partner) need to be reassessed to determine if screening should resume. Some women have medical problems that increase the chance of getting cervical cancer. In these cases, your health care provider may recommend more frequent screening and Pap tests.  Colorectal Cancer  This type of cancer can be detected and often prevented.  Routine colorectal cancer screening usually begins at 71 years of age and continues through 72 years of age.  Your health care provider may recommend screening at an earlier age if you have risk factors  for colon cancer.  Your health care provider may also recommend using home test kits to check for hidden blood in the stool.  A small camera at the end of a tube can be used to examine your colon directly (sigmoidoscopy or colonoscopy). This is done to check for the earliest forms of colorectal cancer.  Routine screening usually begins at age 84.  Direct examination of the colon should be repeated every 5-10 years through 72 years of age. However, you may need to be screened more often if early forms of precancerous polyps or small growths are found.  Skin Cancer  Check your skin from head to toe regularly.  Tell your health care provider about any new moles or changes in moles, especially if there is a change in a mole's shape or color.  Also tell your health care provider if you have a mole that is larger than the size of a pencil eraser.  Always use sunscreen. Apply sunscreen liberally and repeatedly throughout the day.  Protect yourself by wearing  long sleeves, pants, a wide-brimmed hat, and sunglasses whenever you are outside.  Heart disease, diabetes, and high blood pressure  High blood pressure causes heart disease and increases the risk of stroke. High blood pressure is more likely to develop in: ? People who have blood pressure in the high end of the normal range (130-139/85-89 mm Hg). ? People who are overweight or obese. ? People who are African American.  If you are 84-53 years of age, have your blood pressure checked every 3-5 years. If you are 36 years of age or older, have your blood pressure checked every year. You should have your blood pressure measured twice-once when you are at a hospital or clinic, and once when you are not at a hospital or clinic. Record the average of the two measurements. To check your blood pressure when you are not at a hospital or clinic, you can use: ? An automated blood pressure machine at a pharmacy. ? A home blood pressure monitor.  If  you are between 81 years and 46 years old, ask your health care provider if you should take aspirin to prevent strokes.  Have regular diabetes screenings. This involves taking a blood sample to check your fasting blood sugar level. ? If you are at a normal weight and have a low risk for diabetes, have this test once every three years after 72 years of age. ? If you are overweight and have a high risk for diabetes, consider being tested at a younger age or more often. Preventing infection Hepatitis B  If you have a higher risk for hepatitis B, you should be screened for this virus. You are considered at high risk for hepatitis B if: ? You were born in a country where hepatitis B is common. Ask your health care provider which countries are considered high risk. ? Your parents were born in a high-risk country, and you have not been immunized against hepatitis B (hepatitis B vaccine). ? You have HIV or AIDS. ? You use needles to inject street drugs. ? You live with someone who has hepatitis B. ? You have had sex with someone who has hepatitis B. ? You get hemodialysis treatment. ? You take certain medicines for conditions, including cancer, organ transplantation, and autoimmune conditions.  Hepatitis C  Blood testing is recommended for: ? Everyone born from [birth year] through 1965. ? Anyone with known risk factors for hepatitis C.  Sexually transmitted infections (STIs)  You should be screened for sexually transmitted infections (STIs) including gonorrhea and chlamydia if: ? You are sexually active and are younger than 72 years of age. ? You are older than 72 years of age and your health care provider tells you that you are at risk for this type of infection. ? Your sexual activity has changed since you were last screened and you are at an increased risk for chlamydia or gonorrhea. Ask your health care provider if you are at risk.  If you do not have HIV, but are at risk, it may be recommended  that you take a prescription medicine daily to prevent HIV infection. This is called pre-exposure prophylaxis (PrEP). You are considered at risk if: ? You are sexually active and do not regularly use condoms or know the HIV status of your partner(s). ? You take drugs by injection. ? You are sexually active with a partner who has HIV.  Talk with your health care provider about whether you are at high risk of being infected with HIV.  If you choose to begin PrEP, you should first be tested for HIV. You should then be tested every 3 months for as long as you are taking PrEP. Pregnancy  If you are premenopausal and you may become pregnant, ask your health care provider about preconception counseling.  If you may become pregnant, take 400 to 800 micrograms (mcg) of folic acid every day.  If you want to prevent pregnancy, talk to your health care provider about birth control (contraception). Osteoporosis and menopause  Osteoporosis is a disease in which the bones lose minerals and strength with aging. This can result in serious bone fractures. Your risk for osteoporosis can be identified using a bone density scan.  If you are 8 years of age or older, or if you are at risk for osteoporosis and fractures, ask your health care provider if you should be screened.  Ask your health care provider whether you should take a calcium or vitamin D supplement to lower your risk for osteoporosis.  Menopause may have certain physical symptoms and risks.  Hormone replacement therapy may reduce some of these symptoms and risks. Talk to your health care provider about whether hormone replacement therapy is right for you. Follow these instructions at home:  Schedule regular health, dental, and eye exams.  Stay current with your immunizations.  Do not use any tobacco products including cigarettes, chewing tobacco, or electronic cigarettes.  If you are pregnant, do not drink alcohol.  If you are  breastfeeding, limit how much and how often you drink alcohol.  Limit alcohol intake to no more than 1 drink per day for nonpregnant women. One drink equals 12 ounces of beer, 5 ounces of wine, or 1 ounces of hard liquor.  Do not use street drugs.  Do not share needles.  Ask your health care provider for help if you need support or information about quitting drugs.  Tell your health care provider if you often feel depressed.  Tell your health care provider if you have ever been abused or do not feel safe at home. This information is not intended to replace advice given to you by your health care provider. Make sure you discuss any questions you have with your health care provider. Document Released: 07/08/2010 Document Revised: 05/31/2015 Document Reviewed: 09/26/2014 Elsevier Interactive Patient Education  [employer].

## 2017-12-14 NOTE — Progress Notes (Signed)
Subjective:   Karen Bell is a 72 y.o. female who presents for Medicare Annual (Subsequent) preventive examination.  Review of Systems:  No ROS.  Medicare Wellness Visit. Additional risk factors are reflected in the social history.  Cardiac Risk Factors include: advanced age (>25men, >73 women);dyslipidemia;hypertension;family history of premature cardiovascular disease;sedentary lifestyle   Sleep patterns: Sleeps 7 hours. Up to void x 2.  Home Safety/Smoke Alarms: Feels safe in home. Smoke alarms in place.  Living environment; residence and Firearm Safety: Lives with husband in 2 story home, Restaurant manager, fast food on first floor. No rail.  Seat Belt Safety/Bike Helmet: Wears seat belt.   Female:   Pap-N/A       Mammo-07/30/2017, BI-RADS CATEGORY  1: Negative.         Dexa scan-02/13/2015, normal. [facility]. [facility].      CCS-Cologuard 06/27/2016, Negative.      Objective:     Vitals: BP 118/70 (BP Location: Left Arm, Patient Position: Sitting, Cuff Size: Normal)   Pulse 69   Resp 16   Ht 5\' 5"  (1.651 m)   Wt 148 lb 6 oz (67.3 kg)   SpO2 99%   BMI 24.69 kg/m   Body mass index is 24.69 kg/m.  Advanced Directives 12/14/2017  Does Patient Have a Medical Advance Directive? Yes  Type of Advance Directive Living will;Healthcare Power of [place] in Chart? No - copy requested    Tobacco Social History   Tobacco Use  Smoking Status Never Smoker  Smokeless Tobacco Never Used     Counseling given: Not Answered   Past Medical History:  Diagnosis Date  . Acquired hammertoes of both feet   . Anterior subluxation of right shoulder 07/2017   Relocated by Dr. Tamala Julian  . Cervical disc disorder with radiculopathy    "   "       "      "  . Colon cancer screening    Colonoscopy normal 2008.  Cologuard NEG 06/2016.  Repeat cologuard 06/2019.  . Diverticulosis   . Family history of abdominal aortic aneurysm    pt has had AAA screening x 2, most  recent was 02/2015.  Marland Kitchen History of migraine    These resolved after menopause  . HTN (hypertension)   . Hyperlipidemia, mixed   . Insomnia    maintenance problems; trazodone no help  . Intractable hiccups 10/2017   resolved with PPI and reglan  . Left rotator cuff tear arthropathy 04/2015   Dr. Charlann Boxer  . Nephrolithiasis 1994  . Pancreatic cyst 11/2017   vs duodenal diverticulum partially obstructing ampulla??  Mild dilatation of CBD and main pancreatic duct.  Radiologist recommends repeat MR abd 3-6 mo-->pt agreeable to this plan.  . Renal cysts, acquired, bilateral 11/12/2017   R kidney with 2.4x 2.4x 2.4cm complex cyst-->MR abd-->Bosniak 1 and 2, no worrisome renal lesion.  Extrahepatic bile duce and pancreatic duct dilatation + cyst in pancreatic head-->plan repeat MR abd w wo 3-6 mo.   Past Surgical History:  Procedure Laterality Date  . Aortic ultrasound  02/13/13; 02/2015   no aneurism (screening for high risk)  . BUNIONECTOMY  2006   bilat  . carotid dopplers (screening for PAD)  11/16/2008   "minor left carotid dz" per old records.  No hx of TIA/CVA.  Marland Kitchen CESAREAN SECTION  x 2    [place]  . COLONOSCOPY  07/2006   recall 10 yrs.  'tics.  (  Dr. Domenica Fail in [place], [place])  . CYST EXCISION     right index finger, base of fingernail  . kidney stone extraction  1992   Cystoscopy: fragmented ureteral stone.  No procedures for this since then.   Family History  Problem Relation Age of Onset  . Cancer Mother        ? ovarian vs endometrial  . Hyperlipidemia Father   . Hypertension Father   . Bladder Cancer Brother   . AAA (abdominal aortic aneurysm) Brother   . Colon cancer Unknown        Negative history.  . Diabetes Unknown   . Kidney disease Brother   . AAA (abdominal aortic aneurysm) Brother   . Dementia Brother   . Heart disease Brother   . AAA (abdominal aortic aneurysm) Brother   . Diabetes Brother   . Hypertension Brother    Social History   Socioeconomic  History  . Marital status: Married    Spouse name: Not on file  . Number of children: Not on file  . Years of education: Not on file  . Highest education level: Not on file  Occupational History  . Not on file  Social Needs  . Financial resource strain: Not on file  . Food insecurity:    Worry: Not on file    Inability: Not on file  . Transportation needs:    Medical: Not on file    Non-medical: Not on file  Tobacco Use  . Smoking status: Never Smoker  . Smokeless tobacco: Never Used  Substance and Sexual Activity  . Alcohol use: Yes    Alcohol/week: 0.0 standard drinks    Comment: rarely  . Drug use: Never  . Sexual activity: Not on file  Lifestyle  . Physical activity:    Days per week: Not on file    Minutes per session: Not on file  . Stress: Not on file  Relationships  . Social connections:    Talks on phone: Not on file    Gets together: Not on file    Attends religious service: Not on file    Active member of club or organization: Not on file    Attends meetings of clubs or organizations: Not on file    Relationship status: Not on file  Other Topics Concern  . Not on file  Social History Narrative   Married, 2 daughters.  Four older brothers, 3 of which have had abdominal aneurisms.   Relocated from [place] area 05/2013.   Occupation: retired Research officer, political party.   No tob, occ alcohol.      Outpatient Encounter Medications as of 12/14/2017  Medication Sig  . cetirizine (ZYRTEC) 10 MG tablet Take 10 mg by mouth daily. Allertec  . fenofibrate 54 MG tablet Take 1 tablet (54 mg total) by mouth daily.  . simvastatin (ZOCOR) 40 MG tablet Take 1 tablet (40 mg total) by mouth daily.  . calcium citrate-vitamin D (CITRACAL+D) 315-200 MG-UNIT per tablet Take 1 tablet by mouth daily.  . Cholecalciferol (VITAMIN D) 2000 units tablet Take 2,000 Units by mouth daily.  . Multiple Vitamins-Minerals (CENTRUM SILVER PO) Take by mouth.   No facility-administered encounter  medications on file as of 12/14/2017.     Activities of Daily Living In your present state of health, do you have any difficulty performing the following activities: 12/14/2017  Hearing? N  Vision? N  Difficulty concentrating or making decisions? N  Walking or climbing stairs? N  Dressing or  bathing? N  Doing errands, shopping? N  Preparing Food and eating ? N  Using the Toilet? N  In the past six months, have you accidently leaked urine? N  Do you have problems with loss of bowel control? N  Managing your Medications? N  Managing your Finances? N  Housekeeping or managing your Housekeeping? N  Some recent data might be hidden    Patient Care Team: Tammi Sou, MD as PCP - General (Family Medicine) Harriett Sine, MD as Consulting Physician (Dermatology) Monna Fam, MD as Consulting Physician (Ophthalmology) Azucena Fallen, MD as Consulting Physician (Obstetrics and Gynecology) Lyndal Pulley, DO as Consulting Physician (Sports Medicine) Paulla Dolly Tamala Fothergill, DPM as Consulting Physician (Podiatry)    Assessment:   This is a routine wellness examination for Karen Bell.  Exercise Activities and Dietary recommendations Current Exercise Habits: The patient does not participate in regular exercise at present, Exercise limited by: None identified   Diet (meal preparation, eat out, water intake, caffeinated beverages, dairy products, fruits and vegetables): Drinks water. Occasional soda. Avoids juice.  Breakfast: cereal, fruit; smoothie; coffee Lunch: sandwich (pbj, [place], tuna), chips/pretzels, chocolate treat Dinner: lean protein and vegetables.   Goals   None     Fall Risk Fall Risk  12/14/2017 06/12/2017 12/11/2016 05/10/2015 05/12/2014  Falls in the past year? 0 No No No No     Depression Screen PHQ 2/9 Scores 12/14/2017 06/12/2017 12/11/2016 05/10/2015  PHQ - 2 Score 0 0 0 0     Cognitive Function        Immunization History  Administered Date(s) Administered  .  Influenza, High Dose Seasonal PF 10/09/2016, 09/19/2017  . Influenza-Unspecified 09/24/2015  . Pneumococcal Conjugate-13 11/09/2013  . Pneumococcal Polysaccharide-23 06/15/2015    Screening Tests Health Maintenance  Topic Date Due  . TETANUS/TDAP  10/27/2017  . COLONOSCOPY  06/28/2019  . MAMMOGRAM  07/31/2019  . INFLUENZA VACCINE  Completed  . DEXA SCAN  Completed  . Hepatitis C Screening  Completed  . PNA vac Low Risk Adult  Discontinued        Plan:     Shingles vaccine at pharmacy.   Schedule bone scan with mammogram in July 2020.   Bring a copy of your living will and/or healthcare power of attorney to your next office visit.  Continue doing brain stimulating activities (puzzles, reading, adult coloring books, staying active) to keep memory sharp.   I have personally reviewed and noted the following in the patient's chart:   . Medical and social history . Use of alcohol, tobacco or illicit drugs  . Current medications and supplements . Functional ability and status . Nutritional status . Physical activity . Advanced directives . List of other physicians . Hospitalizations, surgeries, and ER visits in previous 12 months . Vitals . Screenings to include cognitive, depression, and falls . Referrals and appointments  In addition, I have reviewed and discussed with patient certain preventive protocols, quality metrics, and best practice recommendations. A written personalized care plan for preventive services as well as general preventive health recommendations were provided to patient.     Gerilyn Nestle, RN  12/14/2017

## 2017-12-16 NOTE — Progress Notes (Signed)
AWV reviewed and agree. Signed:  Crissie Sickles, MD           12/16/2017

## 2018-02-19 ENCOUNTER — Encounter: Payer: Self-pay | Admitting: Family Medicine

## 2018-02-19 ENCOUNTER — Other Ambulatory Visit: Payer: Self-pay | Admitting: *Deleted

## 2018-02-19 DIAGNOSIS — K862 Cyst of pancreas: Secondary | ICD-10-CM

## 2018-02-24 ENCOUNTER — Other Ambulatory Visit (INDEPENDENT_AMBULATORY_CARE_PROVIDER_SITE_OTHER): Payer: Medicare HMO

## 2018-02-24 DIAGNOSIS — K862 Cyst of pancreas: Secondary | ICD-10-CM | POA: Diagnosis not present

## 2018-02-24 LAB — BASIC METABOLIC PANEL
BUN: 26 mg/dL — ABNORMAL HIGH (ref 6–23)
CHLORIDE: 101 meq/L (ref 96–112)
CO2: 30 mEq/L (ref 19–32)
CREATININE: 1.11 mg/dL (ref 0.40–1.20)
Calcium: 10.2 mg/dL (ref 8.4–10.5)
GFR: 48.25 mL/min — ABNORMAL LOW (ref >60.00)
Glucose, Bld: 132 mg/dL — ABNORMAL HIGH (ref 70–99)
POTASSIUM: 4.1 meq/L (ref 3.5–5.1)
Sodium: 139 mEq/L (ref 135–145)

## 2018-02-25 ENCOUNTER — Other Ambulatory Visit: Payer: Self-pay | Admitting: Family Medicine

## 2018-02-25 DIAGNOSIS — R7989 Other specified abnormal findings of blood chemistry: Secondary | ICD-10-CM

## 2018-02-27 ENCOUNTER — Ambulatory Visit (HOSPITAL_BASED_OUTPATIENT_CLINIC_OR_DEPARTMENT_OTHER)
Admission: RE | Admit: 2018-02-27 | Discharge: 2018-02-27 | Disposition: A | Discharge status: Home / Self Care | Payer: Medicare HMO | Source: Ambulatory Visit | Attending: Family Medicine | Admitting: Family Medicine

## 2018-02-27 DIAGNOSIS — K8689 Other specified diseases of pancreas: Secondary | ICD-10-CM | POA: Diagnosis present

## 2018-02-27 DIAGNOSIS — K862 Cyst of pancreas: Secondary | ICD-10-CM | POA: Diagnosis not present

## 2018-02-27 MED ORDER — GADOBUTROL 1 MMOL/ML IV SOLN
7.0000 mL | Freq: Once | INTRAVENOUS | Status: AC | PRN
  Administered 2018-02-27: 7 mL via INTRAVENOUS

## 2018-03-01 ENCOUNTER — Encounter: Payer: Self-pay | Admitting: Family Medicine

## 2018-03-02 ENCOUNTER — Encounter: Payer: Self-pay | Admitting: *Deleted

## 2018-03-11 ENCOUNTER — Other Ambulatory Visit (INDEPENDENT_AMBULATORY_CARE_PROVIDER_SITE_OTHER): Payer: Medicare HMO

## 2018-03-11 DIAGNOSIS — R7989 Other specified abnormal findings of blood chemistry: Secondary | ICD-10-CM

## 2018-03-11 LAB — BASIC METABOLIC PANEL
BUN: 25 mg/dL — ABNORMAL HIGH (ref 6–23)
CO2: 32 mEq/L (ref 19–32)
Calcium: 10 mg/dL (ref 8.4–10.5)
Chloride: 100 mEq/L (ref 96–112)
Creatinine, Ser: 1.03 mg/dL (ref 0.40–1.20)
GFR: 52.6 mL/min — ABNORMAL LOW (ref >60.00)
Glucose, Bld: 80 mg/dL (ref 70–99)
Potassium: 4.2 mEq/L (ref 3.5–5.1)
Sodium: 138 mEq/L (ref 135–145)

## 2018-03-12 ENCOUNTER — Encounter: Payer: Self-pay | Admitting: *Deleted

## 2018-06-01 ENCOUNTER — Other Ambulatory Visit: Payer: Self-pay | Admitting: Obstetrics & Gynecology

## 2018-06-01 DIAGNOSIS — Z1231 Encounter for screening mammogram for malignant neoplasm of breast: Secondary | ICD-10-CM

## 2018-06-22 ENCOUNTER — Other Ambulatory Visit: Payer: Self-pay

## 2018-06-22 ENCOUNTER — Ambulatory Visit: Payer: Medicare HMO | Admitting: Family Medicine

## 2018-06-22 ENCOUNTER — Ambulatory Visit (INDEPENDENT_AMBULATORY_CARE_PROVIDER_SITE_OTHER): Payer: Medicare HMO | Admitting: Family Medicine

## 2018-06-22 ENCOUNTER — Encounter: Payer: Self-pay | Admitting: Family Medicine

## 2018-06-22 VITALS — BP 139/87 | HR 65 | Wt 143.0 lb

## 2018-06-22 DIAGNOSIS — E782 Mixed hyperlipidemia: Secondary | ICD-10-CM

## 2018-06-22 NOTE — Progress Notes (Signed)
Virtual Visit via Video Note  I connected with Karen Bell on 06/22/18 at  9:30 AM EDT by a video enabled telemedicine application and verified that I am speaking with the correct person using two identifiers.  Then the visit had to be converted to telephone visit due to technical difficulties.  Location patient: home Location provider:work or home office Persons participating in the virtual visit: patient, provider  I discussed the limitations of evaluation and management by telemedicine/telephone and the availability of in person appointments. The patient expressed understanding and agreed to proceed.  Telemedicine/telephone visit is a necessity given the COVID-19 restrictions in place at the current time.  HPI: 73 y/o WF being seen today for 6 mo f/u mixed hyperlipidemia, HTN not on meds, hx of elevated sCr of unknown etiology/significance.  Home bp monitoring: sounds like avg 130s/80s, checks mornings only.  HLD: taking simva and fenofibr daily w/out problem.  She is paying close attention to good hydration habits.  Avoids NSAIDs.  ROS: no CP, no SOB, no wheezing, no cough, no dizziness, no HAs, no rashes, no melena/hematochezia.  No polyuria or polydipsia.  No myalgias or arthralgias.   Past Medical History:  Diagnosis Date  . Acquired hammertoes of both feet   . Anterior subluxation of right shoulder 07/2017   Relocated by Dr. Tamala Julian  . Cervical disc disorder with radiculopathy    "   "       "      "  . Colon cancer screening    Colonoscopy normal 2008.  Cologuard NEG 06/2016.  Repeat cologuard 06/2019.  . Diverticulosis   . Duodenal diverticulum 11/2017   pancreatic cyst vs duodenal diverticulum partially obstructing ampulla??  Mild dilatation of CBD and main pancreatic duct.  Repeat MRI 02/2018 showed NORMAL pancreas and confirmed duodenal diverticulum.  CBD normal.  . Family history of abdominal aortic aneurysm    Karen Bell has had AAA screening x 2, most recent was 02/2015.  Marland Kitchen Hepatic  steatosis    mild (MRI abd 02/2018)  . History of migraine    These resolved after menopause  . HTN (hypertension)   . Hyperlipidemia, mixed   . Insomnia    maintenance problems; trazodone no help  . Intractable hiccups 10/2017   resolved with PPI and reglan  . Left rotator cuff tear arthropathy 04/2015   Dr. Charlann Boxer  . Nephrolithiasis 1994  . Renal cysts, acquired, bilateral 11/12/2017   R kidney with 2.4x 2.4x 2.4cm complex cyst-->MR abd-->Bosniak 1 and 2, no worrisome renal lesion.  Stable on MRI abd 02/2018.    Past Surgical History:  Procedure Laterality Date  . Aortic ultrasound  02/13/13; 02/2015   no aneurism (screening for high risk)  . BUNIONECTOMY  2006   bilat  . carotid dopplers (screening for PAD)  11/16/2008   "minor left carotid dz" per old records.  No hx of TIA/CVA.  Marland Kitchen CESAREAN SECTION  x 2    [place]  . COLONOSCOPY  07/2006   recall 10 yrs.  'tics.  (Dr. Domenica Fail in [place], [place])  . CYST EXCISION     right index finger, base of fingernail  . kidney stone extraction  1992   Cystoscopy: fragmented ureteral stone.  No procedures for this since then.    Family History  Problem Relation Age of Onset  . Cancer Mother        ? ovarian vs endometrial  . Hyperlipidemia Father   . Hypertension Father   .  Bladder Cancer Brother   . AAA (abdominal aortic aneurysm) Brother   . Colon cancer Other        Negative history.  . Diabetes Other   . Kidney disease Brother   . AAA (abdominal aortic aneurysm) Brother   . Dementia Brother   . Heart disease Brother   . AAA (abdominal aortic aneurysm) Brother   . Diabetes Brother   . Hypertension Brother     SOCIAL HX: Married, several kids, retired [employer] officer, political party.  No tob, rare alc.  Current Outpatient Medications:  .  calcium citrate-vitamin D (CITRACAL+D) 315-200 MG-UNIT per tablet, Take 1 tablet by mouth daily., Disp: , Rfl:  .  cetirizine (ZYRTEC) 10 MG tablet, Take 10 mg by mouth daily. Allertec, Disp: ,  Rfl:  .  Cholecalciferol (VITAMIN D) 2000 units tablet, Take 2,000 Units by mouth daily., Disp: , Rfl:  .  fenofibrate 54 MG tablet, Take 1 tablet (54 mg total) by mouth daily., Disp: 90 tablet, Rfl: 3 .  Multiple Vitamins-Minerals (LUTEIN-ZEAXANTHIN PO), Take by mouth., Disp: , Rfl:  .  simvastatin (ZOCOR) 40 MG tablet, Take 1 tablet (40 mg total) by mouth daily., Disp: 90 tablet, Rfl: 3 .  Multiple Vitamins-Minerals (CENTRUM SILVER PO), Take by mouth., Disp: , Rfl:   EXAM:  VITALS per patient if applicable: BP 347/42 (BP Location: Left Arm, Patient Position: Sitting, Cuff Size: Normal)   Pulse 65   Wt 143 lb (64.9 kg)   BMI 23.80 kg/m    GENERAL: alert, pleasant, lucid thought and speech. No further exam b/c telephone visit.  LABS: none today  Lab Results  Component Value Date   VITAMINB12 852 10/26/2017   Lab Results  Component Value Date   TSH 2.32 10/26/2017   Lab Results  Component Value Date   WBC 8.3 10/26/2017   HGB 11.9 10/26/2017   HCT 35.1 10/26/2017   MCV 85.8 10/26/2017   PLT 363 10/26/2017   Lab Results  Component Value Date   IRON 49 10/26/2017   IRON 47 10/26/2017   TIBC 328 10/26/2017   FERRITIN 377 (H) 11/06/2017    Lab Results  Component Value Date   CREATININE 1.03 03/11/2018   BUN 25 (H) 03/11/2018   NA 138 03/11/2018   K 4.2 03/11/2018   CL 100 03/11/2018   CO2 32 03/11/2018   Lab Results  Component Value Date   ALT 24 12/14/2017   AST 25 12/14/2017   ALKPHOS 43 12/14/2017   BILITOT 0.4 12/14/2017   Lab Results  Component Value Date   CHOL 143 12/14/2017   Lab Results  Component Value Date   HDL 41.50 12/14/2017   Lab Results  Component Value Date   LDLCALC 70 12/14/2017   Lab Results  Component Value Date   TRIG 161.0 (H) 12/14/2017   Lab Results  Component Value Date   CHOLHDL 3 12/14/2017    ASSESSMENT AND PLAN:  Discussed the following assessment and plan:  1) Elev bp w/out dx HTN: We discussed in detail  the way I would like her to monitor her bp over the next 2 wks and she expressed understanding and has agreed to do this and will come back into office in 2 wks to review. No meds at this time.  Low Na diet emphasized. Lytes/cr at next visit in 2 wks.  2) Mixed hyperlipidemia: FLP and hepatic panel at f/u in 2 wks. I discussed the assessment and treatment plan with the patient. The  patient was provided an opportunity to ask questions and all were answered. The patient agreed with the plan and demonstrated an understanding of the instructions.    3) Hx of elev sCr --> this gradually returned to baseline over a 4-5 mo period.  W/u unrevealing. BMET check at next f/u in 2 wks. Avoid NSAIDs. Hydrates well.  The patient was advised to call back or seek an in-person evaluation if the symptoms worsen or if the condition fails to improve as anticipated.  Spent 15 min with Karen Bell today, with >50% of this time spent in counseling and care coordination regarding the above problems.  F/u: 2 weeks in office visit for HTN f/u and labs (already ordered).  Signed:  Crissie Sickles, MD           06/22/2018

## 2018-07-07 ENCOUNTER — Ambulatory Visit (INDEPENDENT_AMBULATORY_CARE_PROVIDER_SITE_OTHER): Payer: Medicare HMO | Admitting: Family Medicine

## 2018-07-07 ENCOUNTER — Encounter: Payer: Self-pay | Admitting: Family Medicine

## 2018-07-07 ENCOUNTER — Other Ambulatory Visit: Payer: Self-pay

## 2018-07-07 VITALS — BP 126/74 | HR 70 | Temp 97.7°F | Resp 16 | Ht 65.0 in | Wt 141.0 lb

## 2018-07-07 DIAGNOSIS — I10 Essential (primary) hypertension: Secondary | ICD-10-CM | POA: Diagnosis not present

## 2018-07-07 DIAGNOSIS — R7989 Other specified abnormal findings of blood chemistry: Secondary | ICD-10-CM

## 2018-07-07 DIAGNOSIS — R030 Elevated blood-pressure reading, without diagnosis of hypertension: Secondary | ICD-10-CM | POA: Diagnosis not present

## 2018-07-07 MED ORDER — HYDROCHLOROTHIAZIDE 12.5 MG PO CAPS: 12.5000 mg | ORAL_CAPSULE | Freq: Every day | ORAL | 0 refills | Status: DC

## 2018-07-07 NOTE — Progress Notes (Signed)
OFFICE VISIT  07/07/2018   CC:  Chief Complaint  Patient presents with  . Follow-up    hypertension, pt is fasting   HPI:    Patient is a 73 y.o. Caucasian female who presents for 2 wk f/u HTN, has not been on meds but at last check (virtual) she stated her avg morning bp was 130s/80s. We did not start meds but I discussed with her a more detailed/correct way of monitoring bp at home and we wanted to review them today in office. Also has hx of elev sCr that was not fully explained and has returned to normal/baseline but we want to keep an eye on it today, esp in light of possibly having to start antihypertensive today.  Interim hx: Reviewed bp/hr's: avg 135-140 syst over about 80 diast, HR avg 68 or so. She feels good.  Has covid 19 frustrations. Walking 1-2 miles per day.  Eats fairly good low Na diet.  ROS: no CP, no SOB, no wheezing, no cough, no dizziness, no HAs, no rashes, no melena/hematochezia.  No polyuria or polydipsia.  No myalgias or arthralgias.   Past Medical History:  Diagnosis Date  . Acquired hammertoes of both feet   . Anterior subluxation of right shoulder 07/2017   Relocated by Dr. Tamala Julian  . Cervical disc disorder with radiculopathy    "   "       "      "  . Colon cancer screening    Colonoscopy normal 2008.  Cologuard NEG 06/2016.  Repeat cologuard 06/2019.  . Diverticulosis   . Duodenal diverticulum 11/2017   pancreatic cyst vs duodenal diverticulum partially obstructing ampulla??  Mild dilatation of CBD and main pancreatic duct.  Repeat MRI 02/2018 showed NORMAL pancreas and confirmed duodenal diverticulum.  CBD normal.  . Family history of abdominal aortic aneurysm    pt has had AAA screening x 2, most recent was 02/2015.  Marland Kitchen Hepatic steatosis    mild (MRI abd 02/2018)  . History of migraine    These resolved after menopause  . HTN (hypertension)   . Hyperlipidemia, mixed   . Insomnia    maintenance problems; trazodone no help  . Intractable hiccups  10/2017   resolved with PPI and reglan  . Left rotator cuff tear arthropathy 04/2015   Dr. Charlann Boxer  . Nephrolithiasis 1994  . Renal cysts, acquired, bilateral 11/12/2017   R kidney with 2.4x 2.4x 2.4cm complex cyst-->MR abd-->Bosniak 1 and 2, no worrisome renal lesion.  Stable on MRI abd 02/2018.    Past Surgical History:  Procedure Laterality Date  . Aortic ultrasound  02/13/13; 02/2015   no aneurism (screening for high risk)  . BUNIONECTOMY  2006   bilat  . carotid dopplers (screening for PAD)  11/16/2008   "minor left carotid dz" per old records.  No hx of TIA/CVA.  Marland Kitchen CESAREAN SECTION  x 2    [place]  . COLONOSCOPY  07/2006   recall 10 yrs.  'tics.  (Dr. Domenica Fail in [place], [place])  . CYST EXCISION     right index finger, base of fingernail  . kidney stone extraction  1992   Cystoscopy: fragmented ureteral stone.  No procedures for this since then.    Outpatient Medications Prior to Visit  Medication Sig Dispense Refill  . calcium citrate-vitamin D (CITRACAL+D) 315-200 MG-UNIT per tablet Take 1 tablet by mouth daily.    . cetirizine (ZYRTEC) 10 MG tablet Take 10 mg by mouth  daily. Allertec    . Cholecalciferol (VITAMIN D) 2000 units tablet Take 2,000 Units by mouth daily.    . fenofibrate 54 MG tablet Take 1 tablet (54 mg total) by mouth daily. 90 tablet 3  . Multiple Vitamins-Minerals (LUTEIN-ZEAXANTHIN PO) Take by mouth.    . simvastatin (ZOCOR) 40 MG tablet Take 1 tablet (40 mg total) by mouth daily. 90 tablet 3  . Multiple Vitamins-Minerals (CENTRUM SILVER PO) Take by mouth.     No facility-administered medications prior to visit.     Allergies  Allergen Reactions  . Metoclopramide Shortness Of Breath    SOB, inc HR, nausea, fatigue  . Erythromycin Other (See Comments)    Abdominal pain  . Tetracyclines & Related Other (See Comments)    Joint pain    ROS As per HPI  PE: Blood pressure 126/74, pulse 70, temperature 97.7 F (36.5 C), temperature source  Temporal, resp. rate 16, height 5\' 5"  (1.651 m), weight 141 lb (64 kg), SpO2 98 %. Body mass index is 23.46 kg/m.  Gen: Alert, well appearing.  Patient is oriented to person, place, time, and situation. AFFECT: pleasant, lucid thought and speech. CV: RRR, no m/r/g.   LUNGS: CTA bilat, nonlabored resps, good aeration in all lung fields. EXT: no clubbing or cyanosis.  no edema.    LABS:    Chemistry      Component Value Date/Time   NA 138 03/11/2018 0852   K 4.2 03/11/2018 0852   CL 100 03/11/2018 0852   CO2 32 03/11/2018 0852   BUN 25 (H) 03/11/2018 0852   CREATININE 1.03 03/11/2018 0852   CREATININE 1.29 (H) 11/06/2017 1115      Component Value Date/Time   CALCIUM 10.0 03/11/2018 0852   ALKPHOS 43 12/14/2017 1038   AST 25 12/14/2017 1038   ALT 24 12/14/2017 1038   BILITOT 0.4 12/14/2017 1038     Lab Results  Component Value Date   CHOL 143 12/14/2017   HDL 41.50 12/14/2017   LDLCALC 70 12/14/2017   TRIG 161.0 (H) 12/14/2017   CHOLHDL 3 12/14/2017   Lab Results  Component Value Date   WBC 8.3 10/26/2017   HGB 11.9 10/26/2017   HCT 35.1 10/26/2017   MCV 85.8 10/26/2017   PLT 363 10/26/2017   Lab Results  Component Value Date   TSH 2.32 10/26/2017    IMPRESSION AND PLAN:  1) HTN: start low dose hctz-->12.5mg  qd. Continue low Na diet, increase exercise. Monitor bp/hr and return in 2 wks to review and recheck BMET at that time.  2) Hx of elevated sCr of unknown etiology.  Has returned to her baseline but we'll keep an eye on this today. BMET.  An After Visit Summary was printed and given to the patient.  FOLLOW UP: Return in about 2 weeks (around 07/21/2018) for f/u HTN.  Signed:  Crissie Sickles, MD           07/07/2018

## 2018-07-08 LAB — BASIC METABOLIC PANEL
BUN/Creatinine Ratio: 28 (calc) — ABNORMAL HIGH (ref 6–22)
BUN: 26 mg/dL — ABNORMAL HIGH (ref 7–25)
CO2: 26 mmol/L (ref 20–32)
Calcium: 10 mg/dL (ref 8.6–10.4)
Chloride: 103 mmol/L (ref 98–110)
Creat: 0.94 mg/dL — ABNORMAL HIGH (ref 0.60–0.93)
Glucose, Bld: 82 mg/dL (ref 65–99)
Potassium: 4.5 mmol/L (ref 3.5–5.3)
Sodium: 140 mmol/L (ref 135–146)

## 2018-07-21 ENCOUNTER — Ambulatory Visit: Payer: Medicare HMO | Admitting: Family Medicine

## 2018-07-21 ENCOUNTER — Other Ambulatory Visit: Payer: Self-pay

## 2018-07-21 ENCOUNTER — Encounter: Payer: Self-pay | Admitting: Family Medicine

## 2018-07-21 VITALS — BP 122/72 | HR 66 | Temp 98.4°F | Resp 16 | Ht 65.0 in | Wt 141.4 lb

## 2018-07-21 DIAGNOSIS — I10 Essential (primary) hypertension: Secondary | ICD-10-CM

## 2018-07-21 MED ORDER — HYDROCHLOROTHIAZIDE 12.5 MG PO CAPS: 12.5000 mg | ORAL_CAPSULE | Freq: Every day | ORAL | 3 refills | Status: DC

## 2018-07-21 NOTE — Progress Notes (Signed)
OFFICE VISIT  07/21/2018   CC:  Chief Complaint  Patient presents with  . Follow-up    hypertension   HPI:    Patient is a 73 y.o. Caucasian female who presents for 2 week f/u HTN. I started her on 12.5mg  hctz last visit. BMET was normal at that time.  Interim hx: Consistently 120s/70s, HR 70s. Some increased urinary frequency on the med, nocturia x 3-4 per night compared to 2-3 per night prior to hctz.   Past Medical History:  Diagnosis Date  . Acquired hammertoes of both feet   . Anterior subluxation of right shoulder 07/2017   Relocated by Dr. Tamala Julian  . Cervical disc disorder with radiculopathy    "   "       "      "  . Colon cancer screening    Colonoscopy normal 2008.  Cologuard NEG 06/2016.  Repeat cologuard 06/2019.  . Diverticulosis   . Duodenal diverticulum 11/2017   pancreatic cyst vs duodenal diverticulum partially obstructing ampulla??  Mild dilatation of CBD and main pancreatic duct.  Repeat MRI 02/2018 showed NORMAL pancreas and confirmed duodenal diverticulum.  CBD normal.  . Family history of abdominal aortic aneurysm    pt has had AAA screening x 2, most recent was 02/2015.  Marland Kitchen Hepatic steatosis    mild (MRI abd 02/2018)  . History of migraine    These resolved after menopause  . HTN (hypertension)   . Hyperlipidemia, mixed   . Insomnia    maintenance problems; trazodone no help  . Intractable hiccups 10/2017   resolved with PPI and reglan  . Left rotator cuff tear arthropathy 04/2015   Dr. Charlann Boxer  . Nephrolithiasis 1994  . Renal cysts, acquired, bilateral 11/12/2017   R kidney with 2.4x 2.4x 2.4cm complex cyst-->MR abd-->Bosniak 1 and 2, no worrisome renal lesion.  Stable on MRI abd 02/2018.    Past Surgical History:  Procedure Laterality Date  . Aortic ultrasound  02/13/13; 02/2015   no aneurism (screening for high risk)  . BUNIONECTOMY  2006   bilat  . carotid dopplers (screening for PAD)  11/16/2008   "minor left carotid dz" per old records.   No hx of TIA/CVA.  Marland Kitchen CESAREAN SECTION  x 2    [place]  . COLONOSCOPY  07/2006   recall 10 yrs.  'tics.  (Dr. Domenica Fail in [place], [place])  . CYST EXCISION     right index finger, base of fingernail  . kidney stone extraction  1992   Cystoscopy: fragmented ureteral stone.  No procedures for this since then.    Outpatient Medications Prior to Visit  Medication Sig Dispense Refill  . calcium citrate-vitamin D (CITRACAL+D) 315-200 MG-UNIT per tablet Take 1 tablet by mouth daily.    . cetirizine (ZYRTEC) 10 MG tablet Take 10 mg by mouth daily. Allertec    . Cholecalciferol (VITAMIN D) 2000 units tablet Take 2,000 Units by mouth daily.    . fenofibrate 54 MG tablet Take 1 tablet (54 mg total) by mouth daily. 90 tablet 3  . hydrochlorothiazide (MICROZIDE) 12.5 MG capsule Take 1 capsule (12.5 mg total) by mouth daily. 30 capsule 0  . Magnesium 400 MG CAPS Take by mouth.    . Multiple Vitamins-Minerals (LUTEIN-ZEAXANTHIN PO) Take by mouth.    . simvastatin (ZOCOR) 40 MG tablet Take 1 tablet (40 mg total) by mouth daily. 90 tablet 3   No facility-administered medications prior to visit.  Allergies  Allergen Reactions  . Metoclopramide Shortness Of Breath    SOB, inc HR, nausea, fatigue  . Erythromycin Other (See Comments)    Abdominal pain  . Tetracyclines & Related Other (See Comments)    Joint pain  . Lisinopril Cough    ROS As per HPI  PE: Blood pressure 122/72, pulse 66, temperature 98.4 F (36.9 C), temperature source Temporal, resp. rate 16, height 5\' 5"  (1.651 m), weight 141 lb 6.4 oz (64.1 kg), SpO2 98 %. Gen: Alert, well appearing.  Patient is oriented to person, place, time, and situation. AFFECT: pleasant, lucid thought and speech.   LABS:    Chemistry      Component Value Date/Time   NA 140 07/07/2018 0822   K 4.5 07/07/2018 0822   CL 103 07/07/2018 0822   CO2 26 07/07/2018 0822   BUN 26 (H) 07/07/2018 0822   CREATININE 0.94 (H) 07/07/2018 0822       Component Value Date/Time   CALCIUM 10.0 07/07/2018 0822   ALKPHOS 43 12/14/2017 1038   AST 25 12/14/2017 1038   ALT 24 12/14/2017 1038   BILITOT 0.4 12/14/2017 1038     Lab Results  Component Value Date   CHOL 143 12/14/2017   HDL 41.50 12/14/2017   LDLCALC 70 12/14/2017   TRIG 161.0 (H) 12/14/2017   CHOLHDL 3 12/14/2017    IMPRESSION AND PLAN:  HTN: well controlled on low dose hctz. No labs needed today. Her mild increased urinary frequency since getting on hctz will hopefully gradually improve but if it becomes intolerable she will call or return.  An After Visit Summary was printed and given to the patient.  FOLLOW UP: Return in about 6 months (around 01/21/2019) for annual CPE (fasting).  Signed:  Crissie Sickles, MD           07/21/2018

## 2018-07-26 DIAGNOSIS — Z0000 Encounter for general adult medical examination without abnormal findings: Secondary | ICD-10-CM | POA: Diagnosis not present

## 2018-07-26 DIAGNOSIS — Z01419 Encounter for gynecological examination (general) (routine) without abnormal findings: Secondary | ICD-10-CM | POA: Diagnosis not present

## 2018-08-04 DIAGNOSIS — L720 Epidermal cyst: Secondary | ICD-10-CM | POA: Diagnosis not present

## 2018-08-04 DIAGNOSIS — I788 Other diseases of capillaries: Secondary | ICD-10-CM | POA: Diagnosis not present

## 2018-08-04 DIAGNOSIS — L821 Other seborrheic keratosis: Secondary | ICD-10-CM | POA: Diagnosis not present

## 2018-08-04 DIAGNOSIS — L570 Actinic keratosis: Secondary | ICD-10-CM | POA: Diagnosis not present

## 2018-08-04 DIAGNOSIS — D692 Other nonthrombocytopenic purpura: Secondary | ICD-10-CM | POA: Diagnosis not present

## 2018-08-04 DIAGNOSIS — L814 Other melanin hyperpigmentation: Secondary | ICD-10-CM | POA: Diagnosis not present

## 2018-08-04 DIAGNOSIS — L813 Cafe au lait spots: Secondary | ICD-10-CM | POA: Diagnosis not present

## 2018-08-07 DIAGNOSIS — M8580 Other specified disorders of bone density and structure, unspecified site: Secondary | ICD-10-CM

## 2018-08-07 HISTORY — DX: Other specified disorders of bone density and structure, unspecified site: M85.80

## 2018-08-07 HISTORY — PX: OTHER SURGICAL HISTORY: SHX169

## 2018-08-21 ENCOUNTER — Other Ambulatory Visit: Payer: Self-pay | Admitting: Family Medicine

## 2018-08-23 ENCOUNTER — Ambulatory Visit
Admission: RE | Admit: 2018-08-23 | Discharge: 2018-08-23 | Disposition: A | Discharge status: Home / Self Care | Payer: Medicare HMO | Source: Ambulatory Visit | Attending: Family Medicine | Admitting: Family Medicine

## 2018-08-23 ENCOUNTER — Ambulatory Visit
Admission: RE | Admit: 2018-08-23 | Discharge: 2018-08-23 | Disposition: A | Discharge status: Home / Self Care | Payer: Medicare HMO | Source: Ambulatory Visit | Attending: Obstetrics & Gynecology | Admitting: Obstetrics & Gynecology

## 2018-08-23 ENCOUNTER — Other Ambulatory Visit: Payer: Self-pay

## 2018-08-23 DIAGNOSIS — Z1231 Encounter for screening mammogram for malignant neoplasm of breast: Secondary | ICD-10-CM

## 2018-08-23 DIAGNOSIS — E2839 Other primary ovarian failure: Secondary | ICD-10-CM

## 2018-08-23 DIAGNOSIS — M85851 Other specified disorders of bone density and structure, right thigh: Secondary | ICD-10-CM | POA: Diagnosis not present

## 2018-08-23 DIAGNOSIS — Z780 Asymptomatic menopausal state: Secondary | ICD-10-CM | POA: Diagnosis not present

## 2018-08-23 MED ORDER — SIMVASTATIN 40 MG PO TABS: 40.0000 mg | ORAL_TABLET | Freq: Every day | ORAL | 1 refills | Status: DC

## 2018-09-07 DIAGNOSIS — H52221 Regular astigmatism, right eye: Secondary | ICD-10-CM | POA: Diagnosis not present

## 2018-09-07 DIAGNOSIS — H5211 Myopia, right eye: Secondary | ICD-10-CM | POA: Diagnosis not present

## 2018-09-07 DIAGNOSIS — H35362 Drusen (degenerative) of macula, left eye: Secondary | ICD-10-CM | POA: Diagnosis not present

## 2018-09-07 DIAGNOSIS — H52222 Regular astigmatism, left eye: Secondary | ICD-10-CM | POA: Diagnosis not present

## 2018-09-07 DIAGNOSIS — H33311 Horseshoe tear of retina without detachment, right eye: Secondary | ICD-10-CM | POA: Diagnosis not present

## 2018-09-07 DIAGNOSIS — H35033 Hypertensive retinopathy, bilateral: Secondary | ICD-10-CM | POA: Diagnosis not present

## 2018-09-07 DIAGNOSIS — H43813 Vitreous degeneration, bilateral: Secondary | ICD-10-CM | POA: Diagnosis not present

## 2018-09-07 DIAGNOSIS — H524 Presbyopia: Secondary | ICD-10-CM | POA: Diagnosis not present

## 2018-09-14 DIAGNOSIS — H43813 Vitreous degeneration, bilateral: Secondary | ICD-10-CM | POA: Diagnosis not present

## 2018-10-01 DIAGNOSIS — H33311 Horseshoe tear of retina without detachment, right eye: Secondary | ICD-10-CM | POA: Diagnosis not present

## 2018-11-09 ENCOUNTER — Encounter: Payer: Self-pay | Admitting: Family Medicine

## 2018-11-15 DIAGNOSIS — H33311 Horseshoe tear of retina without detachment, right eye: Secondary | ICD-10-CM | POA: Diagnosis not present

## 2018-11-15 DIAGNOSIS — H43811 Vitreous degeneration, right eye: Secondary | ICD-10-CM | POA: Diagnosis not present

## 2018-11-17 ENCOUNTER — Ambulatory Visit (INDEPENDENT_AMBULATORY_CARE_PROVIDER_SITE_OTHER): Payer: Medicare HMO | Admitting: Family Medicine

## 2018-11-17 ENCOUNTER — Encounter: Payer: Self-pay | Admitting: Family Medicine

## 2018-11-17 ENCOUNTER — Other Ambulatory Visit: Payer: Self-pay

## 2018-11-17 VITALS — BP 127/86 | HR 70 | Temp 98.3°F | Resp 16 | Ht 64.0 in | Wt 142.4 lb

## 2018-11-17 DIAGNOSIS — E782 Mixed hyperlipidemia: Secondary | ICD-10-CM | POA: Diagnosis not present

## 2018-11-17 DIAGNOSIS — I10 Essential (primary) hypertension: Secondary | ICD-10-CM

## 2018-11-17 DIAGNOSIS — Z0000 Encounter for general adult medical examination without abnormal findings: Secondary | ICD-10-CM | POA: Diagnosis not present

## 2018-11-17 NOTE — Patient Instructions (Signed)
Health Maintenance, Female Adopting a healthy lifestyle and getting preventive care are important in promoting health and wellness. Ask your health care provider about:  The right schedule for you to have regular tests and exams.  Things you can do on your own to prevent diseases and keep yourself healthy. What should I know about diet, weight, and exercise? Eat a healthy diet   Eat a diet that includes plenty of vegetables, fruits, low-fat dairy products, and lean protein.  Do not eat a lot of foods that are high in solid fats, added sugars, or sodium. Maintain a healthy weight Body mass index (BMI) is used to identify weight problems. It estimates body fat based on height and weight. Your health care provider can help determine your BMI and help you achieve or maintain a healthy weight. Get regular exercise Get regular exercise. This is one of the most important things you can do for your health. Most adults should:  Exercise for at least 150 minutes each week. The exercise should increase your heart rate and make you sweat (moderate-intensity exercise).  Do strengthening exercises at least twice a week. This is in addition to the moderate-intensity exercise.  Spend less time sitting. Even light physical activity can be beneficial. Watch cholesterol and blood lipids Have your blood tested for lipids and cholesterol at 73 years of age, then have this test every 5 years. Have your cholesterol levels checked more often if:  Your lipid or cholesterol levels are high.  You are older than 73 years of age.  You are at high risk for heart disease. What should I know about cancer screening? Depending on your health history and family history, you may need to have cancer screening at various ages. This may include screening for:  Breast cancer.  Cervical cancer.  Colorectal cancer.  Skin cancer.  Lung cancer. What should I know about heart disease, diabetes, and high blood  pressure? Blood pressure and heart disease  High blood pressure causes heart disease and increases the risk of stroke. This is more likely to develop in people who have high blood pressure readings, are of African descent, or are overweight.  Have your blood pressure checked: ? Every 3-5 years if you are 18-39 years of age. ? Every year if you are 40 years old or older. Diabetes Have regular diabetes screenings. This checks your fasting blood sugar level. Have the screening done:  Once every three years after age 40 if you are at a normal weight and have a low risk for diabetes.  More often and at a younger age if you are overweight or have a high risk for diabetes. What should I know about preventing infection? Hepatitis B If you have a higher risk for hepatitis B, you should be screened for this virus. Talk with your health care provider to find out if you are at risk for hepatitis B infection. Hepatitis C Testing is recommended for:  Everyone born from 1945 through 1965.  Anyone with known risk factors for hepatitis C. Sexually transmitted infections (STIs)  Get screened for STIs, including gonorrhea and chlamydia, if: ? You are sexually active and are younger than 73 years of age. ? You are older than 73 years of age and your health care provider tells you that you are at risk for this type of infection. ? Your sexual activity has changed since you were last screened, and you are at increased risk for chlamydia or gonorrhea. Ask your health care provider if   you are at risk.  Ask your health care provider about whether you are at high risk for HIV. Your health care provider may recommend a prescription medicine to help prevent HIV infection. If you choose to take medicine to prevent HIV, you should first get tested for HIV. You should then be tested every 3 months for as long as you are taking the medicine. Pregnancy  If you are about to stop having your period (premenopausal) and  you may become pregnant, seek counseling before you get pregnant.  Take 400 to 800 micrograms (mcg) of folic acid every day if you become pregnant.  Ask for birth control (contraception) if you want to prevent pregnancy. Osteoporosis and menopause Osteoporosis is a disease in which the bones lose minerals and strength with aging. This can result in bone fractures. If you are 65 years old or older, or if you are at risk for osteoporosis and fractures, ask your health care provider if you should:  Be screened for bone loss.  Take a calcium or vitamin D supplement to lower your risk of fractures.  Be given hormone replacement therapy (HRT) to treat symptoms of menopause. Follow these instructions at home: Lifestyle  Do not use any products that contain nicotine or tobacco, such as cigarettes, e-cigarettes, and chewing tobacco. If you need help quitting, ask your health care provider.  Do not use street drugs.  Do not share needles.  Ask your health care provider for help if you need support or information about quitting drugs. Alcohol use  Do not drink alcohol if: ? Your health care provider tells you not to drink. ? You are pregnant, may be pregnant, or are planning to become pregnant.  If you drink alcohol: ? Limit how much you use to 0-1 drink a day. ? Limit intake if you are breastfeeding.  Be aware of how much alcohol is in your drink. In the [place] one drink equals one 12 oz bottle of beer (355 mL), one 5 oz glass of wine (148 mL), or one 1 oz glass of hard liquor (44 mL). General instructions  Schedule regular health, dental, and eye exams.  Stay current with your vaccines.  Tell your health care provider if: ? You often feel depressed. ? You have ever been abused or do not feel safe at home. Summary  Adopting a healthy lifestyle and getting preventive care are important in promoting health and wellness.  Follow your health care provider's instructions about healthy  diet, exercising, and getting tested or screened for diseases.  Follow your health care provider's instructions on monitoring your cholesterol and blood pressure. This information is not intended to replace advice given to you by your health care provider. Make sure you discuss any questions you have with your health care provider. Document Released: 07/08/2010 Document Revised: 12/16/2017 Document Reviewed: 12/16/2017 Elsevier Patient Education  2020 [employer]  

## 2018-11-17 NOTE — Progress Notes (Signed)
Office Note 11/17/2018  CC:  Chief Complaint  Patient presents with  . Annual Exam    fasting    HPI:  Karen Bell is a 73 y.o. White female who is here for annual health maintenance exam. GYN MD->Dr. Benjie Karvonen.  Feeling fine.   Initially lost some wt at beginning of covid shut down, was so busy with many things. Now has gained the weight back.   Not sleeping very well but she plans on restarting melatonin.  Exercise: has been doing her walking regularly lately.  HTN: home bp's consistently 120s/70s.  HR avg 70.  Past Medical History:  Diagnosis Date  . Acquired hammertoes of both feet   . Anterior subluxation of right shoulder 07/2017   Relocated by Dr. Tamala Julian  . Cervical disc disorder with radiculopathy    "   "       "      "  . Colon cancer screening    Colonoscopy normal 2008.  Cologuard NEG 06/2016.  Repeat cologuard 06/2019.  . Diverticulosis   . Duodenal diverticulum 11/2017   pancreatic cyst vs duodenal diverticulum partially obstructing ampulla??  Mild dilatation of CBD and main pancreatic duct.  Repeat MRI 02/2018 showed NORMAL pancreas and confirmed duodenal diverticulum.  CBD normal.  . Family history of abdominal aortic aneurysm    pt has had AAA screening x 2, most recent was 02/2015.  Marland Kitchen Hepatic steatosis    mild (MRI abd 02/2018)  . History of migraine    These resolved after menopause  . HTN (hypertension)   . Hyperlipidemia, mixed   . Insomnia    maintenance problems; trazodone no help  . Intractable hiccups 10/2017   resolved with PPI and reglan  . Left rotator cuff tear arthropathy 04/2015   Dr. Charlann Boxer  . Nephrolithiasis 1994  . Osteopenia 08/2018   DEXA 08/2018 T-score -1.2.  Rpt 08/2020.  Marland Kitchen Renal cysts, acquired, bilateral 11/12/2017   R kidney with 2.4x 2.4x 2.4cm complex cyst-->MR abd-->Bosniak 1 and 2, no worrisome renal lesion.  Stable on MRI abd 02/2018.  Marland Kitchen Retinal tear of right eye    Laser retinopexy 9/1 and 9/8, 2020.    Past  Surgical History:  Procedure Laterality Date  . Aortic ultrasound  02/13/13; 02/2015   no aneurism (screening for high risk)  . BUNIONECTOMY  2006   bilat  . carotid dopplers (screening for PAD)  11/16/2008   "minor left carotid dz" per old records.  No hx of TIA/CVA.  Marland Kitchen CESAREAN SECTION  x 2    [place]  . COLONOSCOPY  07/2006   recall 10 yrs.  'tics.  (Dr. Domenica Fail in [place], [place])  . CYST EXCISION     right index finger, base of fingernail  . DEXA  08/2018   T-score -1.2.  Rpt 2 yrs.  . kidney stone extraction  1992   Cystoscopy: fragmented ureteral stone.  No procedures for this since then.  . laser retinopexy Right 9/1 and 9/8,2020.    Family History  Problem Relation Age of Onset  . Cancer Mother        ? ovarian vs endometrial  . Hyperlipidemia Father   . Hypertension Father   . Bladder Cancer Brother   . AAA (abdominal aortic aneurysm) Brother   . Colon cancer Other        Negative history.  . Diabetes Other   . Kidney disease Brother   . AAA (abdominal aortic aneurysm) Brother   .  Dementia Brother   . Heart disease Brother   . AAA (abdominal aortic aneurysm) Brother   . Diabetes Brother   . Hypertension Brother     Social History   Socioeconomic History  . Marital status: Married    Spouse name: Not on file  . Number of children: Not on file  . Years of education: Not on file  . Highest education level: Not on file  Occupational History  . Not on file  Social Needs  . Financial resource strain: Not on file  . Food insecurity    Worry: Not on file    Inability: Not on file  . Transportation needs    Medical: Not on file    Non-medical: Not on file  Tobacco Use  . Smoking status: Never Smoker  . Smokeless tobacco: Never Used  Substance and Sexual Activity  . Alcohol use: Yes    Alcohol/week: 0.0 standard drinks    Comment: rarely  . Drug use: Never  . Sexual activity: Not on file  Lifestyle  . Physical activity    Days per week: Not on file     Minutes per session: Not on file  . Stress: Not on file  Relationships  . Social Herbalist on phone: Not on file    Gets together: Not on file    Attends religious service: Not on file    Active member of club or organization: Not on file    Attends meetings of clubs or organizations: Not on file    Relationship status: Not on file  . Intimate partner violence    Fear of current or ex partner: Not on file    Emotionally abused: Not on file    Physically abused: Not on file    Forced sexual activity: Not on file  Other Topics Concern  . Not on file  Social History Narrative   Married, 2 daughters.  Four older brothers, 3 of which have had abdominal aneurisms.   Relocated from [place] area 05/2013.   Occupation: retired Research officer, political party.   No tob, occ alcohol.      Outpatient Medications Prior to Visit  Medication Sig Dispense Refill  . calcium citrate-vitamin D (CITRACAL+D) 315-200 MG-UNIT per tablet Take 1 tablet by mouth daily.    . cetirizine (ZYRTEC) 10 MG tablet Take 10 mg by mouth daily. Allertec    . Cholecalciferol (VITAMIN D) 2000 units tablet Take 2,000 Units by mouth daily.    . fenofibrate 54 MG tablet TAKE 1 TABLET (54 MG TOTAL) BY MOUTH DAILY. 90 tablet 1  . hydrochlorothiazide (MICROZIDE) 12.5 MG capsule Take 1 capsule (12.5 mg total) by mouth daily. 90 capsule 3  . Magnesium 400 MG CAPS Take by mouth.    . Multiple Vitamins-Minerals (LUTEIN-ZEAXANTHIN PO) Take by mouth.    . simvastatin (ZOCOR) 40 MG tablet Take 1 tablet (40 mg total) by mouth daily. 90 tablet 1   No facility-administered medications prior to visit.     Allergies  Allergen Reactions  . Metoclopramide Shortness Of Breath    SOB, inc HR, nausea, fatigue  . Erythromycin Other (See Comments)    Abdominal pain  . Tetracyclines & Related Other (See Comments)    Joint pain  . Lisinopril Cough    ROS Review of Systems  Constitutional: Negative for appetite change, chills, fatigue  and fever.  HENT: Negative for congestion, dental problem, ear pain and sore throat.   Eyes: Negative for discharge,  redness and visual disturbance.  Respiratory: Negative for cough, chest tightness, shortness of breath and wheezing.   Cardiovascular: Negative for chest pain, palpitations and leg swelling.  Gastrointestinal: Negative for abdominal pain, blood in stool, diarrhea, nausea and vomiting.  Genitourinary: Negative for difficulty urinating, dysuria, flank pain, frequency, hematuria and urgency.  Musculoskeletal: Negative for arthralgias, back pain, joint swelling, myalgias and neck stiffness.  Skin: Negative for pallor and rash.  Neurological: Negative for dizziness, speech difficulty, weakness and headaches.  Hematological: Negative for adenopathy. Does not bruise/bleed easily.  Psychiatric/Behavioral: Negative for confusion and sleep disturbance. The patient is not nervous/anxious.     PE; Blood pressure 127/86, pulse 70, temperature 98.3 F (36.8 C), temperature source Temporal, resp. rate 16, height 5\' 4"  (1.626 m), weight 142 lb 6 oz (64.6 kg), SpO2 98 %. Body mass index is 24.44 kg/m.  Exam chaperoned by Deveron Furlong, CMA.  Gen: Alert, well appearing.  Patient is oriented to person, place, time, and situation. AFFECT: pleasant, lucid thought and speech. ENT: Ears: EACs clear, normal epithelium.  TMs with good light reflex and landmarks bilaterally.  Eyes: no injection, icteris, swelling, or exudate.  EOMI, PERRLA. Nose: no drainage or turbinate edema/swelling.  No injection or focal lesion.  Mouth: lips without lesion/swelling.  Oral mucosa pink and moist.  Dentition intact and without obvious caries or gingival swelling.  Oropharynx without erythema, exudate, or swelling.  Neck: supple/nontender.  No LAD, mass, or TM.  Carotid pulses 2+ bilaterally, without bruits. CV: RRR, no m/r/g.   LUNGS: CTA bilat, nonlabored resps, good aeration in all lung fields. ABD: soft, NT,  ND, BS normal.  No hepatospenomegaly or mass.  No bruits. EXT: no clubbing, cyanosis, or edema.  Musculoskeletal: no joint swelling, erythema, warmth, or tenderness.  ROM of all joints intact. Skin - no sores or suspicious lesions or rashes or color changes   Pertinent labs:  Lab Results  Component Value Date   TSH 2.32 10/26/2017   Lab Results  Component Value Date   WBC 8.3 10/26/2017   HGB 11.9 10/26/2017   HCT 35.1 10/26/2017   MCV 85.8 10/26/2017   PLT 363 10/26/2017   Lab Results  Component Value Date   CREATININE 0.94 (H) 07/07/2018   BUN 26 (H) 07/07/2018   NA 140 07/07/2018   K 4.5 07/07/2018   CL 103 07/07/2018   CO2 26 07/07/2018   Lab Results  Component Value Date   ALT 24 12/14/2017   AST 25 12/14/2017   ALKPHOS 43 12/14/2017   BILITOT 0.4 12/14/2017   Lab Results  Component Value Date   CHOL 143 12/14/2017   Lab Results  Component Value Date   HDL 41.50 12/14/2017   Lab Results  Component Value Date   LDLCALC 70 12/14/2017   Lab Results  Component Value Date   TRIG 161.0 (H) 12/14/2017   Lab Results  Component Value Date   CHOLHDL 3 12/14/2017    ASSESSMENT AND PLAN:   Health maintenance exam: Reviewed age and gender appropriate health maintenance issues (prudent diet, regular exercise, health risks of tobacco and excessive alcohol, use of seatbelts, fire alarms in home, use of sunscreen).  Also reviewed age and gender appropriate health screening as well as vaccine recommendations. Vaccines: Flu-->she has already gotten it this season.  Otherwise completely UTD. Labs: CBC, cmet, FLP. Cervical ca screening: GYN->Dr. Benjie Karvonen, still doing every 2 yrs. Osteoporosis screening: next DEXA 08/2020. Breast ca screening: mammogram 817/20 NEG->repeat 08/2019. Colon ca screening:  Repeat cologuard 06/2019.  An After Visit Summary was printed and given to the patient.  FOLLOW UP:  Return in about 6 months (around 05/17/2019) for routine chronic illness  f/u.  Signed:  Crissie Sickles, MD           11/17/2018

## 2018-11-18 LAB — COMPREHENSIVE METABOLIC PANEL
AG Ratio: 1.7 (calc) (ref 1.0–2.5)
ALT: 20 U/L (ref 6–29)
AST: 24 U/L (ref 10–35)
Albumin: 4.7 g/dL (ref 3.6–5.1)
Alkaline phosphatase (APISO): 41 U/L (ref 37–153)
BUN/Creatinine Ratio: 27 (calc) — ABNORMAL HIGH (ref 6–22)
BUN: 28 mg/dL — ABNORMAL HIGH (ref 7–25)
CO2: 27 mmol/L (ref 20–32)
Calcium: 10.1 mg/dL (ref 8.6–10.4)
Chloride: 100 mmol/L (ref 98–110)
Creat: 1.04 mg/dL — ABNORMAL HIGH (ref 0.60–0.93)
Globulin: 2.7 g/dL (calc) (ref 1.9–3.7)
Glucose, Bld: 86 mg/dL (ref 65–99)
Potassium: 4.5 mmol/L (ref 3.5–5.3)
Sodium: 139 mmol/L (ref 135–146)
Total Bilirubin: 0.4 mg/dL (ref 0.2–1.2)
Total Protein: 7.4 g/dL (ref 6.1–8.1)

## 2018-11-18 LAB — CBC WITH DIFFERENTIAL/PLATELET
Absolute Monocytes: 595 cells/uL (ref 200–950)
Basophils Absolute: 51 cells/uL (ref 0–200)
Basophils Relative: 0.8 %
Eosinophils Absolute: 102 cells/uL (ref 15–500)
Eosinophils Relative: 1.6 %
HCT: 43.3 % (ref 35.0–45.0)
Hemoglobin: 14.4 g/dL (ref 11.7–15.5)
Lymphs Abs: 2118 cells/uL (ref 850–3900)
MCH: 29.8 pg (ref 27.0–33.0)
MCHC: 33.3 g/dL (ref 32.0–36.0)
MCV: 89.6 fL (ref 80.0–100.0)
MPV: 9.7 fL (ref 7.5–12.5)
Monocytes Relative: 9.3 %
Neutro Abs: 3533 cells/uL (ref 1500–7800)
Neutrophils Relative %: 55.2 %
Platelets: 300 10*3/uL (ref 140–400)
RBC: 4.83 10*6/uL (ref 3.80–5.10)
RDW: 12.6 % (ref 11.0–15.0)
Total Lymphocyte: 33.1 %
WBC: 6.4 10*3/uL (ref 3.8–10.8)

## 2018-11-18 LAB — LIPID PANEL
Cholesterol: 146 mg/dL (ref <200)
HDL: 48 mg/dL — ABNORMAL LOW (ref >=50)
LDL Cholesterol (Calc): 77 mg/dL (calc)
Non-HDL Cholesterol (Calc): 98 mg/dL (calc) (ref <130)
Total CHOL/HDL Ratio: 3 (calc) (ref <5.0)
Triglycerides: 127 mg/dL (ref <150)

## 2019-01-24 ENCOUNTER — Other Ambulatory Visit: Payer: Self-pay | Admitting: Family Medicine

## 2019-01-28 ENCOUNTER — Ambulatory Visit: Payer: Medicare HMO | Attending: Internal Medicine

## 2019-01-28 NOTE — Progress Notes (Signed)
   Covid-19 Vaccination Clinic  Name:  Karen Bell    MRN: [id number] DOB: July 31, 1945  01/28/2019  Karen Bell was observed post Covid-19 immunization for 15 minutes without incidence. She was provided with Vaccine Information Sheet and instruction to access the V-Safe system.   Karen Bell was instructed to call 911 with any severe reactions post vaccine: Marland Kitchen Difficulty breathing  . Swelling of your face and throat  . A fast heartbeat  . A bad rash all over your body  . Dizziness and weakness

## 2019-02-15 ENCOUNTER — Ambulatory Visit: Payer: Medicare HMO | Attending: Internal Medicine

## 2019-02-15 DIAGNOSIS — Z23 Encounter for immunization: Secondary | ICD-10-CM | POA: Insufficient documentation

## 2019-02-15 NOTE — Progress Notes (Signed)
   Covid-19 Vaccination Clinic  Name:  Karen Bell    MRN: [id number] DOB: 05/18/1945  02/15/2019  [name] was observed post Covid-19 immunization for 15 minutes without incidence. She was provided with Vaccine Information Sheet and instruction to access the V-Safe system.   [name] was instructed to call 911 with any severe reactions post vaccine: Marland Kitchen Difficulty breathing  . Swelling of your face and throat  . A fast heartbeat  . A bad rash all over your body  . Dizziness and weakness    Immunizations Administered    Name Date Dose VIS Date Route   Pfizer COVID-19 Vaccine 02/15/2019  9:37 AM 0.3 mL 12/17/2018 Intramuscular   Manufacturer: [place]   Lot: [id number]   [place]: [id number]

## 2019-05-17 ENCOUNTER — Other Ambulatory Visit: Payer: Self-pay

## 2019-05-17 ENCOUNTER — Ambulatory Visit: Payer: Medicare HMO | Admitting: Family Medicine

## 2019-05-17 ENCOUNTER — Encounter: Payer: Self-pay | Admitting: Family Medicine

## 2019-05-17 VITALS — BP 125/82 | HR 68 | Temp 97.9°F | Resp 16 | Ht 64.0 in | Wt 149.0 lb

## 2019-05-17 DIAGNOSIS — I10 Essential (primary) hypertension: Secondary | ICD-10-CM | POA: Diagnosis not present

## 2019-05-17 DIAGNOSIS — K760 Fatty (change of) liver, not elsewhere classified: Secondary | ICD-10-CM

## 2019-05-17 DIAGNOSIS — E782 Mixed hyperlipidemia: Secondary | ICD-10-CM

## 2019-05-17 DIAGNOSIS — N182 Chronic kidney disease, stage 2 (mild): Secondary | ICD-10-CM | POA: Diagnosis not present

## 2019-05-17 DIAGNOSIS — Z23 Encounter for immunization: Secondary | ICD-10-CM

## 2019-05-17 LAB — BASIC METABOLIC PANEL
BUN: 26 mg/dL — ABNORMAL HIGH (ref 6–23)
CO2: 32 mEq/L (ref 19–32)
Calcium: 10.3 mg/dL (ref 8.4–10.5)
Chloride: 100 mEq/L (ref 96–112)
Creatinine, Ser: 0.96 mg/dL (ref 0.40–1.20)
GFR: 56.86 mL/min — ABNORMAL LOW (ref >60.00)
Glucose, Bld: 95 mg/dL (ref 70–99)
Potassium: 4.1 mEq/L (ref 3.5–5.1)
Sodium: 140 mEq/L (ref 135–145)

## 2019-05-17 MED ORDER — TETANUS-DIPHTH-ACELL PERTUSSIS 5-2-15.5 LF-MCG/0.5 IM SUSP: 0.5000 mL | Freq: Once | INTRAMUSCULAR | 0 refills | Status: AC

## 2019-05-17 NOTE — Addendum Note (Signed)
Addended by: Deveron Furlong D on: 05/17/2019 10:25 AM   Modules accepted: Orders

## 2019-05-17 NOTE — Progress Notes (Signed)
OFFICE VISIT  05/17/2019   CC:  Chief Complaint  Patient presents with  . Follow-up    RCI, pt is not fasting   HPI:    Patient is a 74 y.o. Caucasian female who presents for 6 mo f/u HTN, HLD, hepatic steatosis, CRI II/III. Feeling well.  Home bp's consistently 125-130 avg syst, upper 70s diast, HR 65-75. Feeling better off lisinopril. No more easy fatigue or dizzy spells since off this med.  Walking 30-40 min every morning, gets HR up to 115 or so. Diet had been higher in cabs/fats while daughter in town--likes to cook. Starting to get more strict the last couple weeks.  CRI: she avoids NSAIDs.   She does focus on good hydration habits.  ROS: no fevers, no CP, no SOB, no wheezing, no cough, no dizziness, no HAs, no rashes, no melena/hematochezia.  No polyuria or polydipsia.  No myalgias or arthralgias.  No focal weakness, paresthesias, or tremors.  No acute vision or hearing abnormalities. No n/v/d or abd pain.  No palpitations.    Past Medical History:  Diagnosis Date  . Acquired hammertoes of both feet   . Anterior subluxation of right shoulder 07/2017   Relocated by Dr. Tamala Julian  . Cervical disc disorder with radiculopathy    "   "       "      "  . Colon cancer screening    Colonoscopy normal 2008.  Cologuard NEG 06/2016.  Repeat cologuard 06/2019.  . Diverticulosis   . Duodenal diverticulum 11/2017   pancreatic cyst vs duodenal diverticulum partially obstructing ampulla??  Mild dilatation of CBD and main pancreatic duct.  Repeat MRI 02/2018 showed NORMAL pancreas and confirmed duodenal diverticulum.  CBD normal.  . Family history of abdominal aortic aneurysm    pt has had AAA screening x 2, most recent was 02/2015.  Marland Kitchen Hepatic steatosis    mild (MRI abd 02/2018)  . History of migraine    These resolved after menopause  . HTN (hypertension)   . Hyperlipidemia, mixed   . Insomnia    maintenance problems; trazodone no help  . Intractable hiccups 10/2017   resolved with  PPI and reglan  . Left rotator cuff tear arthropathy 04/2015   Dr. Charlann Boxer  . Nephrolithiasis 1994  . Osteopenia 08/2018   DEXA 08/2018 T-score -1.2.  Rpt 08/2020.  Marland Kitchen Renal cysts, acquired, bilateral 11/12/2017   R kidney with 2.4x 2.4x 2.4cm complex cyst-->MR abd-->Bosniak 1 and 2, no worrisome renal lesion.  Stable on MRI abd 02/2018.  Marland Kitchen Retinal tear of right eye    Laser retinopexy 9/1 and 9/8, 2020.    Past Surgical History:  Procedure Laterality Date  . Aortic ultrasound  02/13/13; 02/2015   no aneurism (screening for high risk)  . BUNIONECTOMY  2006   bilat  . carotid dopplers (screening for PAD)  11/16/2008   "minor left carotid dz" per old records.  No hx of TIA/CVA.  Marland Kitchen CESAREAN SECTION  x 2    [place]  . COLONOSCOPY  07/2006   recall 10 yrs.  'tics.  (Dr. Domenica Fail in [place], [place])  . CYST EXCISION     right index finger, base of fingernail  . DEXA  08/2018   T-score -1.2.  Rpt 2 yrs.  . kidney stone extraction  1992   Cystoscopy: fragmented ureteral stone.  No procedures for this since then.  . laser retinopexy Right 9/1 and 9/8,2020.    Outpatient  Medications Prior to Visit  Medication Sig Dispense Refill  . calcium citrate-vitamin D (CITRACAL+D) 315-200 MG-UNIT per tablet Take 1 tablet by mouth daily.    . cetirizine (ZYRTEC) 10 MG tablet Take 10 mg by mouth daily. Allertec    . Cholecalciferol (VITAMIN D) 2000 units tablet Take 2,000 Units by mouth daily.    . fenofibrate 54 MG tablet TAKE 1 TABLET (54 MG TOTAL) BY MOUTH DAILY. 90 tablet 1  . hydrochlorothiazide (MICROZIDE) 12.5 MG capsule Take 1 capsule (12.5 mg total) by mouth daily. 90 capsule 3  . Magnesium 400 MG CAPS Take by mouth.    . Multiple Vitamins-Minerals (LUTEIN-ZEAXANTHIN PO) Take by mouth.    . simvastatin (ZOCOR) 40 MG tablet TAKE 1 TABLET BY MOUTH EVERY DAY 90 tablet 1   No facility-administered medications prior to visit.    Allergies  Allergen Reactions  . Metoclopramide Shortness Of  Breath    SOB, inc HR, nausea, fatigue  . Erythromycin Other (See Comments)    Abdominal pain  . Tetracyclines & Related Other (See Comments)    Joint pain  . Lisinopril Cough    ROS As per HPI  PE: Vitals with BMI 05/17/2019 11/17/2018 07/21/2018  Height 5\' 4"  5\' 4"  5\' 5"   Weight 149 lbs 142 lbs 6 oz 141 lbs 6 oz  BMI 25.56 [id number] 0000000  Systolic 0000000 [id number]  Diastolic 82 86 72  Pulse 68 70 66    Gen: Alert, well appearing.  Patient is oriented to person, place, time, and situation. AFFECT: pleasant, lucid thought and speech. CV: RRR, no m/r/g.   LUNGS: CTA bilat, nonlabored resps, good aeration in all lung fields. EXT: no clubbing or cyanosis.  no edema.    LABS:  Lab Results  Component Value Date   TSH 2.32 10/26/2017   Lab Results  Component Value Date   WBC 6.4 11/17/2018   HGB 14.4 11/17/2018   HCT 43.3 11/17/2018   MCV 89.6 11/17/2018   PLT 300 11/17/2018   Lab Results  Component Value Date   CREATININE 1.04 (H) 11/17/2018   BUN 28 (H) 11/17/2018   NA 139 11/17/2018   K 4.5 11/17/2018   CL 100 11/17/2018   CO2 27 11/17/2018   Lab Results  Component Value Date   ALT 20 11/17/2018   AST 24 11/17/2018   ALKPHOS 43 12/14/2017   BILITOT 0.4 11/17/2018   Lab Results  Component Value Date   CHOL 146 11/17/2018   Lab Results  Component Value Date   HDL 48 (L) 11/17/2018   Lab Results  Component Value Date   LDLCALC 77 11/17/2018   Lab Results  Component Value Date   TRIG 127 11/17/2018   Lab Results  Component Value Date   CHOLHDL 3.0 11/17/2018   IMPRESSION AND PLAN:  1) HTN: The current medical regimen is effective;  continue present plan and medications (hctz 12.5mg  qd). BMET today.  2) HLD + mild hepatic steatosis: tolerating statin and fibrate.  Working on diet/exercise. Last LDL 77 and AST/ALT normal 11/2018. Plan repeat 6 mo.  3) CRI II/III: avoiding NSAIDs and has good hydration habits.  An After Visit Summary was printed  and given to the patient.  FOLLOW UP: Return in about 6 months (around 11/17/2019) for annual CPE (fasting).  Signed:  Crissie Sickles, MD           05/17/2019

## 2019-07-19 ENCOUNTER — Other Ambulatory Visit: Payer: Self-pay | Admitting: Family Medicine

## 2019-08-23 ENCOUNTER — Other Ambulatory Visit: Payer: Self-pay | Admitting: Obstetrics & Gynecology

## 2019-08-23 DIAGNOSIS — Z1231 Encounter for screening mammogram for malignant neoplasm of breast: Secondary | ICD-10-CM

## 2019-09-07 ENCOUNTER — Other Ambulatory Visit: Payer: Self-pay

## 2019-09-07 ENCOUNTER — Ambulatory Visit
Admission: RE | Admit: 2019-09-07 | Discharge: 2019-09-07 | Disposition: A | Discharge status: Home / Self Care | Payer: Medicare HMO | Source: Ambulatory Visit | Attending: Obstetrics & Gynecology | Admitting: Obstetrics & Gynecology

## 2019-09-07 DIAGNOSIS — Z1231 Encounter for screening mammogram for malignant neoplasm of breast: Secondary | ICD-10-CM

## 2019-09-24 ENCOUNTER — Encounter: Payer: Self-pay | Admitting: Family Medicine

## 2019-09-24 ENCOUNTER — Other Ambulatory Visit: Payer: Self-pay

## 2019-09-24 ENCOUNTER — Telehealth (INDEPENDENT_AMBULATORY_CARE_PROVIDER_SITE_OTHER): Payer: Medicare HMO | Admitting: Family Medicine

## 2019-09-24 DIAGNOSIS — M79645 Pain in left finger(s): Secondary | ICD-10-CM

## 2019-09-24 MED ORDER — CEPHALEXIN 500 MG PO CAPS: 500.0000 mg | ORAL_CAPSULE | Freq: Two times a day (BID) | ORAL | 0 refills | Status: DC

## 2019-09-24 NOTE — Progress Notes (Signed)
I have discussed the procedure for the virtual visit with the patient who has given consent to proceed with assessment and treatment.   Jodell Cipro, CMA

## 2019-09-24 NOTE — Progress Notes (Signed)
Virtual Visit via Video Note  Subjective  CC:  Chief Complaint  Patient presents with  . swollen thumb    left thumb, denies any injuries, started Thursday morning, 5-6 years ago had cyst removed from ring finger   Evaluated today at the [facility]. New pt to me. Chart reviewed.  I connected with Ashley Jacobs Pohle on 09/24/19 at 11:40 AM EDT by a video enabled telemedicine application and verified that I am speaking with the correct person using two identifiers. Location patient: Home Location provider: [facility] [place], Office Persons participating in the virtual visit: Elga Santy Stallings, Leamon Arnt, MD Reymundo Poll CMA  I discussed the limitations of evaluation and management by telemedicine and the availability of in person appointments. The patient expressed understanding and agreed to proceed. HPI: Karen Bell is a 74 y.o. female who was contacted today to address the problems listed above in the chief complaint. . Very pleasant 74 year old mostly healthy female with history of hypertension hyperlipidemia presents due to 2-day history of red sore spot on left thumb.  Her symptoms started abruptly, waking her from sleep.  She has a red tender nodule on the medial aspect of her left thumb at the DIP.  The joint itself is not swollen.  The area is red and warm.  She denies a blister or pus.  She has had no systemic symptoms specifically no fever malaise.  She has some arthritis but never significant arthritis in her thumb.  No history of gout.  No injury.  Otherwise feels well.  She has been icing it but has not been using any medications.  Pain is mild . Assessment  1. Pain of left thumb      Plan   Pain left thumb: Pain with redness and warmth could be due to inflammation and/or infection.  Difficult to see clearly on video.  Could be inflamed [place] node or infected cyst.  Will cover with antibiotics.  Recommend  NSAIDs, Advil, ice and follow-up in 72 hours if not improving.  No systemic symptoms noted. I discussed the assessment and treatment plan with the patient. The patient was provided an opportunity to ask questions and all were answered. The patient agreed with the plan and demonstrated an understanding of the instructions.   The patient was advised to call back or seek an in-person evaluation if the symptoms worsen or if the condition fails to improve as anticipated. Follow up: As needed Visit date not found  Meds ordered this encounter  Medications  . cephALEXin (KEFLEX) 500 MG capsule    Sig: Take 1 capsule (500 mg total) by mouth 2 (two) times daily.    Dispense:  14 capsule    Refill:  0      I reviewed the patients updated PMH, FH, and SocHx.    Patient Active Problem List   Diagnosis Date Noted  . Other fatigue 11/30/2017  . Acquired subluxation of right shoulder 08/04/2017  . Medicare annual wellness visit, subsequent 05/10/2015  . Cervical disc disorder with radiculopathy of cervical region 05/01/2015  . Left rotator cuff tear arthropathy 05/01/2015  . Essential hypertension 11/09/2013  . Hyperlipemia, mixed 11/09/2013  . Insomnia due to anxiety and fear 11/09/2013   Current Meds  Medication Sig  . calcium citrate-vitamin D (CITRACAL+D) 315-200 MG-UNIT per tablet Take 1 tablet by mouth daily.  . Cholecalciferol (VITAMIN D) 2000 units tablet Take 2,000 Units by mouth daily.  Marland Kitchen  fenofibrate 54 MG tablet TAKE 1 TABLET (54 MG TOTAL) BY MOUTH DAILY.  . hydrochlorothiazide (MICROZIDE) 12.5 MG capsule TAKE 1 CAPSULE BY MOUTH EVERY DAY  . Multiple Vitamins-Minerals (LUTEIN-ZEAXANTHIN PO) Take by mouth.  . simvastatin (ZOCOR) 40 MG tablet TAKE 1 TABLET BY MOUTH EVERY DAY  . Turmeric (QC TUMERIC COMPLEX) 500 MG CAPS Take by mouth.    Allergies: Patient is allergic to metoclopramide, erythromycin, tetracyclines & related, and lisinopril. Family History: Patient family history  includes AAA (abdominal aortic aneurysm) in her brother, brother, and brother; Bladder Cancer in her brother; Cancer in her mother; Colon cancer in an other family member; Dementia in her brother; Diabetes in her brother and another family member; Heart disease in her brother; Hyperlipidemia in her father; Hypertension in her brother and father; Kidney disease in her brother. Social History:  Patient  reports that she has never smoked. She has never used smokeless tobacco. She reports current alcohol use. She reports that she does not use drugs.  Review of Systems: Constitutional: Negative for fever malaise or anorexia Cardiovascular: negative for chest pain Respiratory: negative for SOB or persistent cough Gastrointestinal: negative for abdominal pain  OBJECTIVE Vitals: There were no vitals taken for this visit. General: no acute distress , A&Ox3, she appears well Left medial thumb with bright red area at DIP.  Small nodule present..  Clarity of the video is poor.  Joint itself does not appear to be swollen.  She can bend the thumb.  Remainder of the skin appears clear.  Leamon Arnt, MD

## 2019-10-13 ENCOUNTER — Other Ambulatory Visit: Payer: Self-pay | Admitting: Family Medicine

## 2019-10-18 ENCOUNTER — Telehealth: Payer: Self-pay | Admitting: Family Medicine

## 2019-10-18 NOTE — Telephone Encounter (Signed)
Left message for patient to schedule Annual Wellness Visit.  Please schedule with Nurse Health Advisor Martha Stanley, RN at [facility]  °

## 2019-10-26 ENCOUNTER — Ambulatory Visit (INDEPENDENT_AMBULATORY_CARE_PROVIDER_SITE_OTHER): Payer: Medicare HMO

## 2019-10-26 VITALS — Ht 64.0 in | Wt 149.0 lb

## 2019-10-26 DIAGNOSIS — Z1211 Encounter for screening for malignant neoplasm of colon: Secondary | ICD-10-CM | POA: Diagnosis not present

## 2019-10-26 DIAGNOSIS — Z0000 Encounter for general adult medical examination without abnormal findings: Secondary | ICD-10-CM

## 2019-10-26 NOTE — Patient Instructions (Signed)
Karen Bell , Thank you for taking time to complete your Medicare Wellness Visit. I appreciate your ongoing commitment to your health goals. Please review the following plan we discussed and let me know if I can assist you in the future.   Screening recommendations/referrals: Colonoscopy: Cologuard ordered today. You will be receiving a kit with instructions in the mail. Mammogram: Completed 09/07/2019- Due-09/06/2020 Bone Density: Completed 8/17/20020- Due 08/22/2020 Recommended yearly ophthalmology/optometry visit for glaucoma screening and checkup Recommended yearly dental visit for hygiene and checkup  Vaccinations: Influenza vaccine: Due- May obtain vaccine at our office or your local pharmacy Pneumococcal vaccine: Completed vaccines Tdap vaccine: Up to Date- Due-05/20/2029 Shingles vaccine: Completed vaccines  Covid-19:Completed vaccines  Advanced directives: Please bring a copy for you chart  Conditions/risks identified: See problem list  Next appointment: Follow up in one year for your annual wellness visit 10/31/2020 @ 9:45am   Preventive Care 65 Years and Older, Female Preventive care refers to lifestyle choices and visits with your health care provider that can promote health and wellness. What does preventive care include?  A yearly physical exam. This is also called an annual well check.  Dental exams once or twice a year.  Routine eye exams. Ask your health care provider how often you should have your eyes checked.  Personal lifestyle choices, including:  Daily care of your teeth and gums.  Regular physical activity.  Eating a healthy diet.  Avoiding tobacco and drug use.  Limiting alcohol use.  Practicing safe sex.  Taking low-dose aspirin every day.  Taking vitamin and mineral supplements as recommended by your health care provider. What happens during an annual well check? The services and screenings done by your health care provider during your annual  well check will depend on your age, overall health, lifestyle risk factors, and family history of disease. Counseling  Your health care provider may ask you questions about your:  Alcohol use.  Tobacco use.  Drug use.  Emotional well-being.  Home and relationship well-being.  Sexual activity.  Eating habits.  History of falls.  Memory and ability to understand (cognition).  Work and work Statistician.  Reproductive health. Screening  You may have the following tests or measurements:  Height, weight, and BMI.  Blood pressure.  Lipid and cholesterol levels. These may be checked every 5 years, or more frequently if you are over 73 years old.  Skin check.  Lung cancer screening. You may have this screening every year starting at age 45 if you have a 30-pack-year history of smoking and currently smoke or have quit within the past 15 years.  Fecal occult blood test (FOBT) of the stool. You may have this test every year starting at age 61.  Flexible sigmoidoscopy or colonoscopy. You may have a sigmoidoscopy every 5 years or a colonoscopy every 10 years starting at age 40.  Hepatitis C blood test.  Hepatitis B blood test.  Sexually transmitted disease (STD) testing.  Diabetes screening. This is done by checking your blood sugar (glucose) after you have not eaten for a while (fasting). You may have this done every 1-3 years.  Bone density scan. This is done to screen for osteoporosis. You may have this done starting at age 14.  Mammogram. This may be done every 1-2 years. Talk to your health care provider about how often you should have regular mammograms. Talk with your health care provider about your test results, treatment options, and if necessary, the need for more tests. Vaccines  Your health care provider may recommend certain vaccines, such as:  Influenza vaccine. This is recommended every year.  Tetanus, diphtheria, and acellular pertussis (Tdap, Td) vaccine.  You may need a Td booster every 10 years.  Zoster vaccine. You may need this after age 65.  Pneumococcal 13-valent conjugate (PCV13) vaccine. One dose is recommended after age 2.  Pneumococcal polysaccharide (PPSV23) vaccine. One dose is recommended after age 25. Talk to your health care provider about which screenings and vaccines you need and how often you need them. This information is not intended to replace advice given to you by your health care provider. Make sure you discuss any questions you have with your health care provider. Document Released: 01/19/2015 Document Revised: 09/12/2015 Document Reviewed: 10/24/2014 Elsevier Interactive Patient Education  2017 [employer] Prevention in the Home Falls can cause injuries. They can happen to people of all ages. There are many things you can do to make your home safe and to help prevent falls. What can I do on the outside of my home?  Regularly fix the edges of walkways and driveways and fix any cracks.  Remove anything that might make you trip as you walk through a door, such as a raised step or threshold.  Trim any bushes or trees on the path to your home.  Use bright outdoor lighting.  Clear any walking paths of anything that might make someone trip, such as rocks or tools.  Regularly check to see if handrails are loose or broken. Make sure that both sides of any steps have handrails.  Any raised decks and porches should have guardrails on the edges.  Have any leaves, snow, or ice cleared regularly.  Use sand or salt on walking paths during winter.  Clean up any spills in your garage right away. This includes oil or grease spills. What can I do in the bathroom?  Use night lights.  Install grab bars by the toilet and in the tub and shower. Do not use towel bars as grab bars.  Use non-skid mats or decals in the tub or shower.  If you need to sit down in the shower, use a plastic, non-slip stool.  Keep the  floor dry. Clean up any water that spills on the floor as soon as it happens.  Remove soap buildup in the tub or shower regularly.  Attach bath mats securely with double-sided non-slip rug tape.  Do not have throw rugs and other things on the floor that can make you trip. What can I do in the bedroom?  Use night lights.  Make sure that you have a light by your bed that is easy to reach.  Do not use any sheets or blankets that are too big for your bed. They should not hang down onto the floor.  Have a firm chair that has side arms. You can use this for support while you get dressed.  Do not have throw rugs and other things on the floor that can make you trip. What can I do in the kitchen?  Clean up any spills right away.  Avoid walking on wet floors.  Keep items that you use a lot in easy-to-reach places.  If you need to reach something above you, use a strong step stool that has a grab bar.  Keep electrical cords out of the way.  Do not use floor polish or wax that makes floors slippery. If you must use wax, use non-skid floor wax.  Do  not have throw rugs and other things on the floor that can make you trip. What can I do with my stairs?  Do not leave any items on the stairs.  Make sure that there are handrails on both sides of the stairs and use them. Fix handrails that are broken or loose. Make sure that handrails are as long as the stairways.  Check any carpeting to make sure that it is firmly attached to the stairs. Fix any carpet that is loose or worn.  Avoid having throw rugs at the top or bottom of the stairs. If you do have throw rugs, attach them to the floor with carpet tape.  Make sure that you have a light switch at the top of the stairs and the bottom of the stairs. If you do not have them, ask someone to add them for you. What else can I do to help prevent falls?  Wear shoes that:  Do not have high heels.  Have rubber bottoms.  Are comfortable and fit  you well.  Are closed at the toe. Do not wear sandals.  If you use a stepladder:  Make sure that it is fully opened. Do not climb a closed stepladder.  Make sure that both sides of the stepladder are locked into place.  Ask someone to hold it for you, if possible.  Clearly mark and make sure that you can see:  Any grab bars or handrails.  First and last steps.  Where the edge of each step is.  Use tools that help you move around (mobility aids) if they are needed. These include:  Canes.  Walkers.  Scooters.  Crutches.  Turn on the lights when you go into a dark area. Replace any light bulbs as soon as they burn out.  Set up your furniture so you have a clear path. Avoid moving your furniture around.  If any of your floors are uneven, fix them.  If there are any pets around you, be aware of where they are.  Review your medicines with your doctor. Some medicines can make you feel dizzy. This can increase your chance of falling. Ask your doctor what other things that you can do to help prevent falls. This information is not intended to replace advice given to you by your health care provider. Make sure you discuss any questions you have with your health care provider. Document Released: 10/19/2008 Document Revised: 05/31/2015 Document Reviewed: 01/27/2014 Elsevier Interactive Patient Education  2017 [employer].

## 2019-10-26 NOTE — Progress Notes (Signed)
Subjective:   Karen Bell is a 74 y.o. female who presents for Medicare Annual (Subsequent) preventive examination.   I connected with Tikita today by telephone and verified that I am speaking with the correct person using two identifiers. Location patient: home Location provider: work Persons participating in the virtual visit: patient, Marine scientist.    I discussed the limitations, risks, security and privacy concerns of performing an evaluation and management service by telephone and the availability of in person appointments. I also discussed with the patient that there may be a patient responsible charge related to this service. The patient expressed understanding and verbally consented to this telephonic visit.    Interactive audio and video telecommunications were attempted between this provider and patient, however failed, due to patient having technical difficulties OR patient did not have access to video capability.  We continued and completed visit with audio only.  Some vital signs may be absent or patient reported.   Time Spent with patient on telephone encounter: 20 minutes  Review of Systems     Cardiac Risk Factors include: advanced age (>68men, >33 women);hypertension;dyslipidemia     Objective:    Today's Vitals   10/26/19 0936  Weight: 149 lb (67.6 kg)  Height: 5\' 4"  (1.626 m)   Body mass index is 25.58 kg/m.  Advanced Directives 10/26/2019 12/14/2017  Does Patient Have a Medical Advance Directive? Yes Yes  Type of Paramedic of [place];Living will Living will;Healthcare Power of [place] in Chart? Yes - validated most recent copy scanned in chart (See row information) No - copy requested    Current Medications (verified) Outpatient Encounter Medications as of 10/26/2019  Medication Sig  . calcium citrate-vitamin D (CITRACAL+D) 315-200 MG-UNIT per tablet Take 1 tablet by mouth daily.  .  Cholecalciferol (VITAMIN D) 2000 units tablet Take 2,000 Units by mouth daily.  . fenofibrate 54 MG tablet TAKE 1 TABLET (54 MG TOTAL) BY MOUTH DAILY.  . hydrochlorothiazide (MICROZIDE) 12.5 MG capsule TAKE 1 CAPSULE BY MOUTH EVERY DAY  . simvastatin (ZOCOR) 40 MG tablet TAKE 1 TABLET BY MOUTH EVERY DAY  . Turmeric (QC TUMERIC COMPLEX) 500 MG CAPS Take by mouth.  . [DISCONTINUED] cephALEXin (KEFLEX) 500 MG capsule Take 1 capsule (500 mg total) by mouth 2 (two) times daily.  . [DISCONTINUED] cetirizine (ZYRTEC) 10 MG tablet Take 10 mg by mouth daily. Allertec  . [DISCONTINUED] Magnesium 400 MG CAPS Take by mouth.  . [DISCONTINUED] Multiple Vitamins-Minerals (LUTEIN-ZEAXANTHIN PO) Take by mouth.   No facility-administered encounter medications on file as of 10/26/2019.    Allergies (verified) Metoclopramide, Erythromycin, Tetracyclines & related, and Lisinopril   History: Past Medical History:  Diagnosis Date  . Acquired hammertoes of both feet   . Anterior subluxation of right shoulder 07/2017   Relocated by Dr. Tamala Julian  . Cervical disc disorder with radiculopathy    "   "       "      "  . Colon cancer screening    Colonoscopy normal 2008.  Cologuard NEG 06/2016.  Repeat cologuard 06/2019.  . Diverticulosis   . Duodenal diverticulum 11/2017   pancreatic cyst vs duodenal diverticulum partially obstructing ampulla??  Mild dilatation of CBD and main pancreatic duct.  Repeat MRI 02/2018 showed NORMAL pancreas and confirmed duodenal diverticulum.  CBD normal.  . Family history of abdominal aortic aneurysm    pt has had AAA screening x 2, most recent was  02/2015.  Marland Kitchen Hepatic steatosis    mild (MRI abd 02/2018)  . History of migraine    These resolved after menopause  . HTN (hypertension)   . Hyperlipidemia, mixed   . Insomnia    maintenance problems; trazodone no help  . Intractable hiccups 10/2017   resolved with PPI and reglan  . Left rotator cuff tear arthropathy 04/2015   Dr. Charlann Boxer  . Nephrolithiasis 1994  . Osteopenia 08/2018   DEXA 08/2018 T-score -1.2.  Rpt 08/2020.  Marland Kitchen Renal cysts, acquired, bilateral 11/12/2017   R kidney with 2.4x 2.4x 2.4cm complex cyst-->MR abd-->Bosniak 1 and 2, no worrisome renal lesion.  Stable on MRI abd 02/2018.  Marland Kitchen Retinal tear of right eye    Laser retinopexy 9/1 and 9/8, 2020.   Past Surgical History:  Procedure Laterality Date  . Aortic ultrasound  02/13/13; 02/2015   no aneurism (screening for high risk)  . BUNIONECTOMY  2006   bilat  . carotid dopplers (screening for PAD)  11/16/2008   "minor left carotid dz" per old records.  No hx of TIA/CVA.  Marland Kitchen CESAREAN SECTION  x 2    [place]  . COLONOSCOPY  07/2006   recall 10 yrs.  'tics.  (Dr. Domenica Fail in [place], [place])  . CYST EXCISION     right index finger, base of fingernail  . DEXA  08/2018   T-score -1.2.  Rpt 2 yrs.  . kidney stone extraction  1992   Cystoscopy: fragmented ureteral stone.  No procedures for this since then.  . laser retinopexy Right 9/1 and 9/8,2020.   Family History  Problem Relation Age of Onset  . Cancer Mother        ? ovarian vs endometrial  . Hyperlipidemia Father   . Hypertension Father   . Bladder Cancer Brother   . AAA (abdominal aortic aneurysm) Brother   . Colon cancer Other        Negative history.  . Diabetes Other   . Kidney disease Brother   . AAA (abdominal aortic aneurysm) Brother   . Dementia Brother   . Heart disease Brother   . AAA (abdominal aortic aneurysm) Brother   . Diabetes Brother   . Hypertension Brother    Social History   Socioeconomic History  . Marital status: Married    Spouse name: Not on file  . Number of children: Not on file  . Years of education: Not on file  . Highest education level: Not on file  Occupational History  . Not on file  Tobacco Use  . Smoking status: Never Smoker  . Smokeless tobacco: Never Used  Vaping Use  . Vaping Use: Never used  Substance and Sexual Activity  . Alcohol use:  Yes    Alcohol/week: 0.0 standard drinks    Comment: rarely  . Drug use: Never  . Sexual activity: Not on file  Other Topics Concern  . Not on file  Social History Narrative   Married, 2 daughters.  Four older brothers, 3 of which have had abdominal aneurisms.   Relocated from [place] area 05/2013.   Occupation: retired Research officer, political party.   No tob, occ alcohol.     Social Determinants of Health   Financial Resource Strain: Low Risk   . Difficulty of Paying Living Expenses: Not hard at all  Food Insecurity: No Food Insecurity  . Worried About Charity fundraiser in the Last Year: Never true  . Ran Out of  Food in the Last Year: Never true  Transportation Needs: No Transportation Needs  . Lack of Transportation (Medical): No  . Lack of Transportation (Non-Medical): No  Physical Activity: Sufficiently Active  . Days of Exercise per Week: 6 days  . Minutes of Exercise per Session: 30 min  Stress: No Stress Concern Present  . Feeling of Stress : Not at all  Social Connections: Moderately Isolated  . Frequency of Communication with Friends and Family: More than three times a week  . Frequency of Social Gatherings with Friends and Family: Once a week  . Attends Religious Services: Never  . Active Member of Clubs or Organizations: No  . Attends Archivist Meetings: Never  . Marital Status: Married    Tobacco Counseling Counseling given: Not Answered   Clinical Intake:  Pre-visit preparation completed: Yes  Pain : No/denies pain     Nutritional Status: BMI 25 -29 Overweight Nutritional Risks: None Diabetes: No  How often do you need to have someone help you when you read instructions, pamphlets, or other written materials from your doctor or pharmacy?: 1 - Never What is the last grade level you completed in school?: some college  Diabetic?No  Interpreter Needed?: No  Information entered by :: Caroleen Hamman LPN   Activities of Daily Living In your  present state of health, do you have any difficulty performing the following activities: 10/26/2019  Hearing? Y  Comment mild hearing loss  Vision? N  Difficulty concentrating or making decisions? N  Walking or climbing stairs? N  Dressing or bathing? N  Doing errands, shopping? N  Preparing Food and eating ? N  Using the Toilet? N  In the past six months, have you accidently leaked urine? Y  Comment occasionally  Do you have problems with loss of bowel control? N  Managing your Medications? N  Managing your Finances? N  Housekeeping or managing your Housekeeping? N  Some recent data might be hidden    Patient Care Team: Tammi Sou, MD as PCP - General (Family Medicine) Harriett Sine, MD as Consulting Physician (Dermatology) Azucena Fallen, MD as Consulting Physician (Obstetrics and Gynecology) Lyndal Pulley, DO as Consulting Physician (Sports Medicine) Wallene Huh, DPM as Consulting Physician (Podiatry) Otelia Sergeant, OD as Referring Physician Lucilla Lame (Dentistry) Princess Bruins, MD as Consulting Physician (Ophthalmology)  Indicate any recent Medical Services you may have received from other than [facility] providers in the past year (date may be approximate).     Assessment:   This is a routine wellness examination for [place].  Hearing/Vision screen  Hearing Screening   125Hz  250Hz  500Hz  1000Hz  2000Hz  3000Hz  4000Hz  6000Hz  8000Hz   Right ear:           Left ear:           Comments: Difficulty hearing tv & radio sometimes  Vision Screening Comments: Wears glasses Last eye exam-09/2019-Dr. Posey Pronto  Dietary issues and exercise activities discussed: Current Exercise Habits: Home exercise routine, Type of exercise: walking, Time (Minutes): 30, Frequency (Times/Week): 5, Weekly Exercise (Minutes/Week): 150, Intensity: Mild, Exercise limited by: None identified  Goals    . Increase physical activity     Increase activity.       Depression Screen PHQ 2/9  Scores 10/26/2019 06/22/2018 12/14/2017 06/12/2017 12/11/2016 05/10/2015 05/12/2014  PHQ - 2 Score 1 1 0 0 0 0 0    Fall Risk Fall Risk  10/26/2019 06/22/2018 12/14/2017 06/12/2017 12/11/2016  Falls in the past year? 0  1 0 No No  Number falls in past yr: 0 0 - - -  Injury with Fall? 0 0 - - -  Follow up Falls prevention discussed Falls evaluation completed - - -    Any stairs in or around the home? Yes  If so, are there any without handrails? No  Home free of loose throw rugs in walkways, pet beds, electrical cords, etc? Yes  Adequate lighting in your home to reduce risk of falls? Yes   ASSISTIVE DEVICES UTILIZED TO PREVENT FALLS:  Life alert? No  Use of a cane, walker or w/c? No  Grab bars in the bathroom? No Shower chair or bench in shower? No  Elevated toilet seat or a handicapped toilet? No   TIMED UP AND GO:  Was the test performed? No . Phone visit   Cognitive Function:No cognitive impairment noted MMSE - Mini Mental State Exam 12/14/2017  Orientation to time 5  Orientation to Place 5  Registration 3  Attention/ Calculation 5  Recall 3  Language- name 2 objects 2  Language- repeat 1  Language- follow 3 step command 3  Language- read & follow direction 1  Write a sentence 1  Copy design 1  Total score 30        Immunizations Immunization History  Administered Date(s) Administered  . Influenza, High Dose Seasonal PF 10/09/2016, 09/19/2017  . Influenza-Unspecified 09/24/2015  . PFIZER SARS-COV-2 Vaccination 01/18/2019, 02/15/2019  . Pneumococcal Conjugate-13 11/09/2013  . Pneumococcal Polysaccharide-23 06/15/2015  . Tdap 05/21/2019  . Zoster Recombinat (Shingrix) 01/31/2018, 07/08/2018    TDAP status: Up to date   Flu vaccine status: Due- Patient plans to get vaccine next week.  Pneumococcal vaccine status: Up to date   Covid-19 vaccine status: Completed vaccines  Qualifies for Shingles Vaccine? No   Zostavax completed No   Shingrix Completed?:  Yes  Screening Tests Health Maintenance  Topic Date Due  . COLONOSCOPY  06/28/2019  . INFLUENZA VACCINE  08/07/2019  . MAMMOGRAM  09/06/2021  . TETANUS/TDAP  05/20/2029  . DEXA SCAN  Completed  . COVID-19 Vaccine  Completed  . Hepatitis C Screening  Completed  . PNA vac Low Risk Adult  Discontinued    Health Maintenance  Health Maintenance Due  Topic Date Due  . COLONOSCOPY  06/28/2019  . INFLUENZA VACCINE  08/07/2019    Colorectal cancer screening: Order placed for Cologuard today.  Mammogram status: Completed 09/07/2019. Repeat every year   Bone Density status: Completed 08/23/2018. Results reflect: Bone density results: OSTEOPENIA. Repeat every 2 years.  Lung Cancer Screening: (Low Dose CT Chest recommended if Age 32-80 years, 30 pack-year currently smoking OR have quit w/in 15years.) does not qualify.     Additional Screening:  Hepatitis C Screening:Completed 12/04/2015  Vision Screening: Recommended annual ophthalmology exams for early detection of glaucoma and other disorders of the eye. Is the patient up to date with their annual eye exam?  Yes  Who is the provider or what is the name of the office in which the patient attends annual eye exams? Dr. Posey Pronto   Dental Screening: Recommended annual dental exams for proper oral hygiene  Community Resource Referral / Chronic Care Management: CRR required this visit?  No   CCM required this visit?  No      Plan:     I have personally reviewed and noted the following in the patient's chart:   . Medical and social history . Use of alcohol, tobacco or illicit drugs  .  Current medications and supplements . Functional ability and status . Nutritional status . Physical activity . Advanced directives . List of other physicians . Hospitalizations, surgeries, and ER visits in previous 12 months . Vitals . Screenings to include cognitive, depression, and falls . Referrals and appointments  In addition, I have  reviewed and discussed with patient certain preventive protocols, quality metrics, and best practice recommendations. A written personalized care plan for preventive services as well as general preventive health recommendations were provided to patient.    Due to this being a telephonic visit, the after visit summary with patients personalized plan was offered to patient via mail or my-chart. Patient would like to access on my-chart.  Marta Antu, LPN   10/09/4959  Nurse Health Advisor  Nurse Notes: None

## 2019-11-07 ENCOUNTER — Other Ambulatory Visit: Payer: Self-pay | Admitting: Family Medicine

## 2019-11-08 ENCOUNTER — Other Ambulatory Visit: Payer: Self-pay | Admitting: Family Medicine

## 2019-11-16 ENCOUNTER — Other Ambulatory Visit: Payer: Self-pay

## 2019-11-17 ENCOUNTER — Ambulatory Visit (INDEPENDENT_AMBULATORY_CARE_PROVIDER_SITE_OTHER): Payer: Medicare HMO | Admitting: Family Medicine

## 2019-11-17 ENCOUNTER — Encounter: Payer: Self-pay | Admitting: Family Medicine

## 2019-11-17 VITALS — BP 142/82 | HR 64 | Temp 98.0°F | Resp 16 | Ht 64.0 in | Wt 147.6 lb

## 2019-11-17 DIAGNOSIS — I10 Essential (primary) hypertension: Secondary | ICD-10-CM

## 2019-11-17 DIAGNOSIS — Z0000 Encounter for general adult medical examination without abnormal findings: Secondary | ICD-10-CM | POA: Diagnosis not present

## 2019-11-17 DIAGNOSIS — E782 Mixed hyperlipidemia: Secondary | ICD-10-CM | POA: Diagnosis not present

## 2019-11-17 NOTE — Patient Instructions (Signed)
Health Maintenance, Female Adopting a healthy lifestyle and getting preventive care are important in promoting health and wellness. Ask your health care provider about:  The right schedule for you to have regular tests and exams.  Things you can do on your own to prevent diseases and keep yourself healthy. What should I know about diet, weight, and exercise? Eat a healthy diet   Eat a diet that includes plenty of vegetables, fruits, low-fat dairy products, and lean protein.  Do not eat a lot of foods that are high in solid fats, added sugars, or sodium. Maintain a healthy weight Body mass index (BMI) is used to identify weight problems. It estimates body fat based on height and weight. Your health care provider can help determine your BMI and help you achieve or maintain a healthy weight. Get regular exercise Get regular exercise. This is one of the most important things you can do for your health. Most adults should:  Exercise for at least 150 minutes each week. The exercise should increase your heart rate and make you sweat (moderate-intensity exercise).  Do strengthening exercises at least twice a week. This is in addition to the moderate-intensity exercise.  Spend less time sitting. Even light physical activity can be beneficial. Watch cholesterol and blood lipids Have your blood tested for lipids and cholesterol at 74 years of age, then have this test every 5 years. Have your cholesterol levels checked more often if:  Your lipid or cholesterol levels are high.  You are older than 74 years of age.  You are at high risk for heart disease. What should I know about cancer screening? Depending on your health history and family history, you may need to have cancer screening at various ages. This may include screening for:  Breast cancer.  Cervical cancer.  Colorectal cancer.  Skin cancer.  Lung cancer. What should I know about heart disease, diabetes, and high blood  pressure? Blood pressure and heart disease  High blood pressure causes heart disease and increases the risk of stroke. This is more likely to develop in people who have high blood pressure readings, are of African descent, or are overweight.  Have your blood pressure checked: ? Every 3-5 years if you are 18-39 years of age. ? Every year if you are 40 years old or older. Diabetes Have regular diabetes screenings. This checks your fasting blood sugar level. Have the screening done:  Once every three years after age 40 if you are at a normal weight and have a low risk for diabetes.  More often and at a younger age if you are overweight or have a high risk for diabetes. What should I know about preventing infection? Hepatitis B If you have a higher risk for hepatitis B, you should be screened for this virus. Talk with your health care provider to find out if you are at risk for hepatitis B infection. Hepatitis C Testing is recommended for:  Everyone born from 1945 through 1965.  Anyone with known risk factors for hepatitis C. Sexually transmitted infections (STIs)  Get screened for STIs, including gonorrhea and chlamydia, if: ? You are sexually active and are younger than 74 years of age. ? You are older than 74 years of age and your health care provider tells you that you are at risk for this type of infection. ? Your sexual activity has changed since you were last screened, and you are at increased risk for chlamydia or gonorrhea. Ask your health care provider if   you are at risk.  Ask your health care provider about whether you are at high risk for HIV. Your health care provider may recommend a prescription medicine to help prevent HIV infection. If you choose to take medicine to prevent HIV, you should first get tested for HIV. You should then be tested every 3 months for as long as you are taking the medicine. Pregnancy  If you are about to stop having your period (premenopausal) and  you may become pregnant, seek counseling before you get pregnant.  Take 400 to 800 micrograms (mcg) of folic acid every day if you become pregnant.  Ask for birth control (contraception) if you want to prevent pregnancy. Osteoporosis and menopause Osteoporosis is a disease in which the bones lose minerals and strength with aging. This can result in bone fractures. If you are 65 years old or older, or if you are at risk for osteoporosis and fractures, ask your health care provider if you should:  Be screened for bone loss.  Take a calcium or vitamin D supplement to lower your risk of fractures.  Be given hormone replacement therapy (HRT) to treat symptoms of menopause. Follow these instructions at home: Lifestyle  Do not use any products that contain nicotine or tobacco, such as cigarettes, e-cigarettes, and chewing tobacco. If you need help quitting, ask your health care provider.  Do not use street drugs.  Do not share needles.  Ask your health care provider for help if you need support or information about quitting drugs. Alcohol use  Do not drink alcohol if: ? Your health care provider tells you not to drink. ? You are pregnant, may be pregnant, or are planning to become pregnant.  If you drink alcohol: ? Limit how much you use to 0-1 drink a day. ? Limit intake if you are breastfeeding.  Be aware of how much alcohol is in your drink. In the [place] one drink equals one 12 oz bottle of beer (355 mL), one 5 oz glass of wine (148 mL), or one 1 oz glass of hard liquor (44 mL). General instructions  Schedule regular health, dental, and eye exams.  Stay current with your vaccines.  Tell your health care provider if: ? You often feel depressed. ? You have ever been abused or do not feel safe at home. Summary  Adopting a healthy lifestyle and getting preventive care are important in promoting health and wellness.  Follow your health care provider's instructions about healthy  diet, exercising, and getting tested or screened for diseases.  Follow your health care provider's instructions on monitoring your cholesterol and blood pressure. This information is not intended to replace advice given to you by your health care provider. Make sure you discuss any questions you have with your health care provider. Document Revised: 12/16/2017 Document Reviewed: 12/16/2017 Elsevier Patient Education  2020 Elsevier Inc.  

## 2019-11-17 NOTE — Progress Notes (Signed)
Office Note 11/17/2019  CC:  Chief Complaint  Patient presents with  . Annual Exam    pt is fasting    HPI:  Karen Bell is a 74 y.o. White female who is here for annual health maintenance exam and f/u HTN and mixed HLD.  She's feeling well. Walks 30+ min daily at good pace. Diet is healthy.  HLD: compliant with fenofibrate and simvastatin daily, no side effects. HTN: home bp's consistently <130/80   Past Medical History:  Diagnosis Date  . Acquired hammertoes of both feet   . Anterior subluxation of right shoulder 07/2017   Relocated by Dr. Tamala Julian  . Cervical disc disorder with radiculopathy    "   "       "      "  . Colon cancer screening    Colonoscopy normal 2008.  Cologuard NEG 06/2016.  Repeat cologuard 06/2019.  . Diverticulosis   . Duodenal diverticulum 11/2017   pancreatic cyst vs duodenal diverticulum partially obstructing ampulla??  Mild dilatation of CBD and main pancreatic duct.  Repeat MRI 02/2018 showed NORMAL pancreas and confirmed duodenal diverticulum.  CBD normal.  . Family history of abdominal aortic aneurysm    pt has had AAA screening x 2, most recent was 02/2015.  Marland Kitchen Hepatic steatosis    mild (MRI abd 02/2018)  . History of migraine    These resolved after menopause  . HTN (hypertension)   . Hyperlipidemia, mixed   . Insomnia    maintenance problems; trazodone no help  . Intractable hiccups 10/2017   resolved with PPI and reglan  . Left rotator cuff tear arthropathy 04/2015   Dr. Charlann Boxer  . Nephrolithiasis 1994  . Osteopenia 08/2018   DEXA 08/2018 T-score -1.2.  Rpt 08/2020.  Marland Kitchen Renal cysts, acquired, bilateral 11/12/2017   R kidney with 2.4x 2.4x 2.4cm complex cyst-->MR abd-->Bosniak 1 and 2, no worrisome renal lesion.  Stable on MRI abd 02/2018.  Marland Kitchen Retinal tear of right eye    Laser retinopexy 9/1 and 9/8, 2020.    Past Surgical History:  Procedure Laterality Date  . Aortic ultrasound  02/13/13; 02/2015   no aneurism (screening  for high risk)  . BUNIONECTOMY  2006   bilat  . carotid dopplers (screening for PAD)  11/16/2008   "minor left carotid dz" per old records.  No hx of TIA/CVA.  Marland Kitchen CESAREAN SECTION  x 2    [place]  . COLONOSCOPY  07/2006   recall 10 yrs.  'tics.  (Dr. Domenica Fail in [place], [place])  . CYST EXCISION     right index finger, base of fingernail  . DEXA  08/2018   T-score -1.2.  Rpt 2 yrs.  . kidney stone extraction  1992   Cystoscopy: fragmented ureteral stone.  No procedures for this since then.  . laser retinopexy Right 9/1 and 9/8,2020.   Family History  Problem Relation Age of Onset  . Cancer Mother        ? ovarian vs endometrial  . Hyperlipidemia Father   . Hypertension Father   . Bladder Cancer Brother   . AAA (abdominal aortic aneurysm) Brother   . Colon cancer Other        Negative history.  . Diabetes Other   . Kidney disease Brother   . AAA (abdominal aortic aneurysm) Brother   . Dementia Brother   . Heart disease Brother   . AAA (abdominal aortic aneurysm) Brother   . Diabetes  Brother   . Hypertension Brother     Social History   Socioeconomic History  . Marital status: Married    Spouse name: Not on file  . Number of children: Not on file  . Years of education: Not on file  . Highest education level: Not on file  Occupational History  . Not on file  Tobacco Use  . Smoking status: Never Smoker  . Smokeless tobacco: Never Used  Vaping Use  . Vaping Use: Never used  Substance and Sexual Activity  . Alcohol use: Yes    Alcohol/week: 0.0 standard drinks    Comment: rarely  . Drug use: Never  . Sexual activity: Not on file  Other Topics Concern  . Not on file  Social History Narrative   Married, 2 daughters.  Four older brothers, 3 of which have had abdominal aneurisms.   Relocated from [place] area 05/2013.   Occupation: retired Research officer, political party.   No tob, occ alcohol.     Social Determinants of Health   Financial Resource Strain: Low Risk   .  Difficulty of Paying Living Expenses: Not hard at all  Food Insecurity: No Food Insecurity  . Worried About Charity fundraiser in the Last Year: Never true  . Ran Out of Food in the Last Year: Never true  Transportation Needs: No Transportation Needs  . Lack of Transportation (Medical): No  . Lack of Transportation (Non-Medical): No  Physical Activity: Sufficiently Active  . Days of Exercise per Week: 6 days  . Minutes of Exercise per Session: 30 min  Stress: No Stress Concern Present  . Feeling of Stress : Not at all  Social Connections: Moderately Isolated  . Frequency of Communication with Friends and Family: More than three times a week  . Frequency of Social Gatherings with Friends and Family: Once a week  . Attends Religious Services: Never  . Active Member of Clubs or Organizations: No  . Attends Archivist Meetings: Never  . Marital Status: Married  Human resources officer Violence: Not At Risk  . Fear of Current or Ex-Partner: No  . Emotionally Abused: No  . Physically Abused: No  . Sexually Abused: No    Outpatient Medications Prior to Visit  Medication Sig Dispense Refill  . calcium citrate-vitamin D (CITRACAL+D) 315-200 MG-UNIT per tablet Take 1 tablet by mouth daily.    . Cholecalciferol (VITAMIN D) 2000 units tablet Take 2,000 Units by mouth daily.    . fenofibrate 54 MG tablet TAKE 1 TABLET BY MOUTH DAILY 30 tablet 0  . hydrochlorothiazide (MICROZIDE) 12.5 MG capsule TAKE 1 CAPSULE BY MOUTH EVERY DAY 30 capsule 0  . Multiple Vitamins-Minerals (LUTEIN-ZEAXANTHIN PO) Take by mouth daily.    . Multiple Vitamins-Minerals (MULTIVITAMIN ADULTS PO) Take by mouth daily.    . simvastatin (ZOCOR) 40 MG tablet TAKE 1 TABLET BY MOUTH EVERY DAY 30 tablet 0  . Turmeric (QC TUMERIC COMPLEX) 500 MG CAPS Take by mouth.     No facility-administered medications prior to visit.    Allergies  Allergen Reactions  . Metoclopramide Shortness Of Breath    SOB, inc HR, nausea,  fatigue  . Erythromycin Other (See Comments)    Abdominal pain  . Tetracyclines & Related Other (See Comments)    Joint pain  . Lisinopril Cough    ROS Review of Systems  Constitutional: Negative for appetite change, chills, fatigue and fever.  HENT: Negative for congestion, dental problem, ear pain and sore throat.  Eyes: Negative for discharge, redness and visual disturbance.  Respiratory: Negative for cough, chest tightness, shortness of breath and wheezing.   Cardiovascular: Negative for chest pain, palpitations and leg swelling.  Gastrointestinal: Negative for abdominal pain, blood in stool, diarrhea, nausea and vomiting.  Genitourinary: Negative for difficulty urinating, dysuria, flank pain, frequency, hematuria and urgency.  Musculoskeletal: Negative for arthralgias, back pain, joint swelling, myalgias and neck stiffness.  Skin: Negative for pallor and rash.  Neurological: Negative for dizziness, speech difficulty, weakness and headaches.  Hematological: Negative for adenopathy. Does not bruise/bleed easily.  Psychiatric/Behavioral: Negative for confusion and sleep disturbance. The patient is not nervous/anxious.     PE; Vitals with BMI 11/17/2019 10/26/2019 05/17/2019  Height 5\' 4"  5\' 4"  5\' 4"   Weight 147 lbs 10 oz 149 lbs 149 lbs  BMI 25.32 67.12 45.80  Systolic 998 - 338  Diastolic 82 - 82  Pulse 64 - 68    Gen: Alert, well appearing.  Patient is oriented to person, place, time, and situation. AFFECT: pleasant, lucid thought and speech. ENT: Ears: EACs clear, normal epithelium.  TMs with good light reflex and landmarks bilaterally.  Eyes: no injection, icteris, swelling, or exudate.  EOMI, PERRLA. Nose: no drainage or turbinate edema/swelling.  No injection or focal lesion.  Mouth: lips without lesion/swelling.  Oral mucosa pink and moist.  Dentition intact and without obvious caries or gingival swelling.  Oropharynx without erythema, exudate, or swelling.  Neck:  supple/nontender.  No LAD, mass, or TM.  Carotid pulses 2+ bilaterally, without bruits. CV: RRR, no m/r/g.   LUNGS: CTA bilat, nonlabored resps, good aeration in all lung fields. ABD: soft, NT, ND, BS normal.  No hepatospenomegaly or mass.  No bruits. EXT: no clubbing, cyanosis, or edema.  Musculoskeletal: no joint swelling, erythema, warmth, or tenderness.  ROM of all joints intact. Skin - no sores or suspicious lesions or rashes or color changes    Pertinent labs:  Lab Results  Component Value Date   TSH 2.32 10/26/2017   Lab Results  Component Value Date   WBC 6.4 11/17/2018   HGB 14.4 11/17/2018   HCT 43.3 11/17/2018   MCV 89.6 11/17/2018   PLT 300 11/17/2018   Lab Results  Component Value Date   CREATININE 0.96 05/17/2019   BUN 26 (H) 05/17/2019   NA 140 05/17/2019   K 4.1 05/17/2019   CL 100 05/17/2019   CO2 32 05/17/2019   Lab Results  Component Value Date   ALT 20 11/17/2018   AST 24 11/17/2018   ALKPHOS 43 12/14/2017   BILITOT 0.4 11/17/2018   Lab Results  Component Value Date   CHOL 146 11/17/2018   Lab Results  Component Value Date   HDL 48 (L) 11/17/2018   Lab Results  Component Value Date   LDLCALC 77 11/17/2018   Lab Results  Component Value Date   TRIG 127 11/17/2018   Lab Results  Component Value Date   CHOLHDL 3.0 11/17/2018    ASSESSMENT AND PLAN:   1) HLD: tolerating fibrate and statin. Continue these and check FLP and hepatic panel today.  2) HTN: good control. Lytes/cr today. Continue hctz 12.5mg  qd.  3) Health maintenance exam: Reviewed age and gender appropriate health maintenance issues (prudent diet, regular exercise, health risks of tobacco and excessive alcohol, use of seatbelts, fire alarms in home, use of sunscreen).  Also reviewed age and gender appropriate health screening as well as vaccine recommendations. Vaccines: ALL UTD. Labs: CBC, CMET, FLP.  Cervical ca screening: no further pap/pelvic indicated. Breast ca  screening: Mammo NEG 09/2019, plan rpt 09/2020. Colon ca screening: hx of normal colonoscopy 2008.  Cologuard neg 2018.  Rpt Cologuard ordered/pending (10/2019). Osteoporosis screening: pt has osteopenia, next DEXA due approx 08/2020.  An After Visit Summary was printed and given to the patient.  FOLLOW UP:  Return in about 6 months (around 05/16/2020) for routine chronic illness f/u.  Signed:  Crissie Sickles, MD           11/17/2019

## 2019-11-18 LAB — LIPID PANEL
Cholesterol: 159 mg/dL (ref <200)
HDL: 50 mg/dL (ref >=50)
LDL Cholesterol (Calc): 83 mg/dL (calc)
Non-HDL Cholesterol (Calc): 109 mg/dL (calc) (ref <130)
Total CHOL/HDL Ratio: 3.2 (calc) (ref <5.0)
Triglycerides: 164 mg/dL — ABNORMAL HIGH (ref <150)

## 2019-11-18 LAB — CBC WITH DIFFERENTIAL/PLATELET
Absolute Monocytes: 549 cells/uL (ref 200–950)
Basophils Absolute: 59 cells/uL (ref 0–200)
Basophils Relative: 1 %
Eosinophils Absolute: 100 cells/uL (ref 15–500)
Eosinophils Relative: 1.7 %
HCT: 43.2 % (ref 35.0–45.0)
Hemoglobin: 14.9 g/dL (ref 11.7–15.5)
Lymphs Abs: 1906 cells/uL (ref 850–3900)
MCH: 30.8 pg (ref 27.0–33.0)
MCHC: 34.5 g/dL (ref 32.0–36.0)
MCV: 89.3 fL (ref 80.0–100.0)
MPV: 9.6 fL (ref 7.5–12.5)
Monocytes Relative: 9.3 %
Neutro Abs: 3286 cells/uL (ref 1500–7800)
Neutrophils Relative %: 55.7 %
Platelets: 306 10*3/uL (ref 140–400)
RBC: 4.84 10*6/uL (ref 3.80–5.10)
RDW: 12.1 % (ref 11.0–15.0)
Total Lymphocyte: 32.3 %
WBC: 5.9 10*3/uL (ref 3.8–10.8)

## 2019-11-18 LAB — COMPREHENSIVE METABOLIC PANEL
AG Ratio: 1.5 (calc) (ref 1.0–2.5)
ALT: 24 U/L (ref 6–29)
AST: 27 U/L (ref 10–35)
Albumin: 4.8 g/dL (ref 3.6–5.1)
Alkaline phosphatase (APISO): 42 U/L (ref 37–153)
BUN/Creatinine Ratio: 23 (calc) — ABNORMAL HIGH (ref 6–22)
BUN: 22 mg/dL (ref 7–25)
CO2: 28 mmol/L (ref 20–32)
Calcium: 10.3 mg/dL (ref 8.6–10.4)
Chloride: 101 mmol/L (ref 98–110)
Creat: 0.97 mg/dL — ABNORMAL HIGH (ref 0.60–0.93)
Globulin: 3.2 g/dL (calc) (ref 1.9–3.7)
Glucose, Bld: 82 mg/dL (ref 65–99)
Potassium: 4.2 mmol/L (ref 3.5–5.3)
Sodium: 140 mmol/L (ref 135–146)
Total Bilirubin: 0.5 mg/dL (ref 0.2–1.2)
Total Protein: 8 g/dL (ref 6.1–8.1)

## 2019-11-25 ENCOUNTER — Other Ambulatory Visit: Payer: Self-pay | Admitting: Family Medicine

## 2019-11-25 MED ORDER — FENOFIBRATE 54 MG PO TABS
54.0000 mg | ORAL_TABLET | Freq: Every day | ORAL | 1 refills | Status: DC
Start: 2019-11-25 — End: 2020-05-17

## 2019-11-25 MED ORDER — HYDROCHLOROTHIAZIDE 12.5 MG PO CAPS: ORAL_CAPSULE | ORAL | 1 refills | Status: DC

## 2019-11-25 MED ORDER — SIMVASTATIN 40 MG PO TABS
40.0000 mg | ORAL_TABLET | Freq: Every day | ORAL | 1 refills | Status: DC
Start: 2019-11-25 — End: 2020-05-17

## 2019-11-25 NOTE — Telephone Encounter (Signed)
[facility] - Client Nonclinical Telephone Record  [facility] Client [facility] - Client Client [facility] - Day Physician Crissie Sickles - MD Contact Type Call Who Is Calling Patient / Member / Family / Caregiver Caller Name [place] Phone Number [phone] Patient Name Karen Bell Patient DOB [phone] Call Type Message Only Information Provided Reason for Call Medication Question / Request Initial Comment Caller states that she was in last week and she didn't get her refills for prescription when she went in last week, and pharmacy only gave her 30 days and no refills said she said shed like to get her prescription 90 days. [facility] Disp. Time Disposition Final User 11/24/2019 3:07:20 PM General Information Provided Yes Sharlot Gowda Call Closed By: Sharlot Gowda Transaction Date/Time: 11/24/2019 3:02:04 PM (ET)

## 2019-11-25 NOTE — Telephone Encounter (Signed)
Patient is requesting refills of simvastatin, fenofibrate and HCTZ x 90 days with refills. She had physical 11/17/19 and states she forgot to ask for these refills. Please send to [facility].

## 2019-11-25 NOTE — Telephone Encounter (Signed)
rx refilled.

## 2019-12-27 LAB — COLOGUARD: Cologuard: NEGATIVE

## 2020-01-07 DIAGNOSIS — S62001A Unspecified fracture of navicular [scaphoid] bone of right wrist, initial encounter for closed fracture: Secondary | ICD-10-CM

## 2020-01-07 HISTORY — DX: Unspecified fracture of navicular (scaphoid) bone of right wrist, initial encounter for closed fracture: S62.001A

## 2020-01-17 ENCOUNTER — Telehealth: Payer: Self-pay | Admitting: Family Medicine

## 2020-01-17 NOTE — Telephone Encounter (Signed)
Results were not received by our office but I was able to print them from the cologuard website. Placed on PCP desk for review, will follow up with patient. HM updated

## 2020-01-17 NOTE — Telephone Encounter (Signed)
Patient had Cologuard test dated 10/26/19. Last week, she received a letter from [facility] stating results had been sent to our office. I do not see results in Epic. Please call patient to advise.

## 2020-01-18 ENCOUNTER — Encounter: Payer: Self-pay | Admitting: Family Medicine

## 2020-01-18 NOTE — Telephone Encounter (Signed)
Patient advised and voiced understanding.  

## 2020-01-18 NOTE — Telephone Encounter (Signed)
Cologuard result: NEGATIVE. This is good.

## 2020-02-29 ENCOUNTER — Encounter: Payer: Self-pay | Admitting: Family Medicine

## 2020-04-23 ENCOUNTER — Emergency Department (HOSPITAL_BASED_OUTPATIENT_CLINIC_OR_DEPARTMENT_OTHER)
Admission: EM | Admit: 2020-04-23 | Discharge: 2020-04-23 | Disposition: A | Discharge status: Home / Self Care | Payer: Medicare HMO | Attending: Emergency Medicine | Admitting: Emergency Medicine

## 2020-04-23 ENCOUNTER — Other Ambulatory Visit: Payer: Self-pay

## 2020-04-23 ENCOUNTER — Telehealth: Payer: Self-pay

## 2020-04-23 ENCOUNTER — Encounter (HOSPITAL_BASED_OUTPATIENT_CLINIC_OR_DEPARTMENT_OTHER): Payer: Self-pay | Admitting: Emergency Medicine

## 2020-04-23 DIAGNOSIS — W19XXXD Unspecified fall, subsequent encounter: Secondary | ICD-10-CM | POA: Insufficient documentation

## 2020-04-23 DIAGNOSIS — Z79899 Other long term (current) drug therapy: Secondary | ICD-10-CM | POA: Insufficient documentation

## 2020-04-23 DIAGNOSIS — I10 Essential (primary) hypertension: Secondary | ICD-10-CM | POA: Diagnosis not present

## 2020-04-23 DIAGNOSIS — S6291XD Unspecified fracture of right wrist and hand, subsequent encounter for fracture with routine healing: Secondary | ICD-10-CM | POA: Diagnosis not present

## 2020-04-23 DIAGNOSIS — M7989 Other specified soft tissue disorders: Secondary | ICD-10-CM | POA: Insufficient documentation

## 2020-04-23 DIAGNOSIS — Z4789 Encounter for other orthopedic aftercare: Secondary | ICD-10-CM | POA: Diagnosis not present

## 2020-04-23 DIAGNOSIS — S62101D Fracture of unspecified carpal bone, right wrist, subsequent encounter for fracture with routine healing: Secondary | ICD-10-CM

## 2020-04-23 NOTE — Telephone Encounter (Signed)
Fell over weekend and injured her wrist.  She was seen at an Urgent Care in [place].  They gave her a wrist splint. Patient wanted to know if she should where wrist splint until she is seen by orthopaedist next week.  Please advise. [phone]

## 2020-04-23 NOTE — ED Provider Notes (Signed)
[facility] DEPT Provider Note   CSN: [id number] Arrival date & time: 04/23/20  0865     History Chief Complaint  Patient presents with  . Hand Pain    Karen Bell is a 75 y.o. female.  HPI Patient was seen at the emergency department in [place] she had a mechanical fall and was placed in an Ortho-Glass splint with a wrist fracture.  Plan was for follow-up in [facility] with local orthopedics.  Patient has made her follow-up appointment.  She reports she called her family doctor because she was starting to see some bluish discoloration in her hand and had been counseled at her prior visit that if she saw that she should have a recheck.  Her doctor advised her to come to the emergency department.  Patient notes that she is not having any numbness or tingling.  She does not feel that her pain is increased.  She has unwrapped the splint several times to bathe and change it.  She reports that she did have her husband wrap it tighter because it was swelling.  Her other wounds are healing without complications. she reports she is taking ibuprofen with adequate pain control    Past Medical History:  Diagnosis Date  . Acquired hammertoes of both feet   . Anterior subluxation of right shoulder 07/2017   Relocated by Dr. Tamala Julian  . Cervical disc disorder with radiculopathy    "   "       "      "  . Colon cancer screening    Colonoscopy normal 2008.  Cologuard NEG 06/2016.  Repeat 12/2019 NEG. Consider rpt 2024.  . Diverticulosis   . Duodenal diverticulum 11/2017   pancreatic cyst vs duodenal diverticulum partially obstructing ampulla??  Mild dilatation of CBD and main pancreatic duct.  Repeat MRI 02/2018 showed NORMAL pancreas and confirmed duodenal diverticulum.  CBD normal.  . Family history of abdominal aortic aneurysm    pt has had AAA screening x 2, most recent was 02/2015.  Marland Kitchen Hepatic steatosis    mild (MRI abd 02/2018)  . History of migraine     These resolved after menopause  . HTN (hypertension)    + hypertensive retinopathy  . Hyperlipidemia, mixed   . Insomnia    maintenance problems; trazodone no help  . Intractable hiccups 10/2017   resolved with PPI and reglan  . Left rotator cuff tear arthropathy 04/2015   Dr. Charlann Boxer  . Nephrolithiasis 1994  . Osteopenia 08/2018   DEXA 08/2018 T-score -1.2.  Rpt 08/2020.  Marland Kitchen Renal cysts, acquired, bilateral 11/12/2017   R kidney with 2.4x 2.4x 2.4cm complex cyst-->MR abd-->Bosniak 1 and 2, no worrisome renal lesion.  Stable on MRI abd 02/2018.  Marland Kitchen Retinal tear of right eye    Laser retinopexy 9/1 and 9/8, 2020.    Patient Active Problem List   Diagnosis Date Noted  . Other fatigue 11/30/2017  . Acquired subluxation of right shoulder 08/04/2017  . Medicare annual wellness visit, subsequent 05/10/2015  . Cervical disc disorder with radiculopathy of cervical region 05/01/2015  . Left rotator cuff tear arthropathy 05/01/2015  . Essential hypertension 11/09/2013  . Hyperlipemia, mixed 11/09/2013  . Insomnia due to anxiety and fear 11/09/2013    Past Surgical History:  Procedure Laterality Date  . Aortic ultrasound  02/13/13; 02/2015   no aneurism (screening for high risk)  . BUNIONECTOMY  2006   bilat  . carotid dopplers (screening for PAD)  11/16/2008   "minor left carotid dz" per old records.  No hx of TIA/CVA.  Marland Kitchen CESAREAN SECTION  x 2    [place]  . COLONOSCOPY  07/2006   recall 10 yrs.  'tics.  (Dr. Domenica Fail in [place], [place])  . CYST EXCISION     right index finger, base of fingernail  . DEXA  08/2018   T-score -1.2.  Rpt 2 yrs.  . kidney stone extraction  1992   Cystoscopy: fragmented ureteral stone.  No procedures for this since then.  . laser retinopexy Right 9/1 and 9/8,2020.     OB History   No obstetric history on file.     Family History  Problem Relation Age of Onset  . Cancer Mother        ? ovarian vs endometrial  . Hyperlipidemia Father   .  Hypertension Father   . Bladder Cancer Brother   . AAA (abdominal aortic aneurysm) Brother   . Colon cancer Other        Negative history.  . Diabetes Other   . Kidney disease Brother   . AAA (abdominal aortic aneurysm) Brother   . Dementia Brother   . Heart disease Brother   . AAA (abdominal aortic aneurysm) Brother   . Diabetes Brother   . Hypertension Brother     Social History   Tobacco Use  . Smoking status: Never Smoker  . Smokeless tobacco: Never Used  Vaping Use  . Vaping Use: Never used  Substance Use Topics  . Alcohol use: Yes    Alcohol/week: 0.0 standard drinks    Comment: rarely  . Drug use: Never    Home Medications Prior to Admission medications   Medication Sig Start Date End Date Taking? Authorizing Provider  calcium citrate-vitamin D (CITRACAL+D) 315-200 MG-UNIT per tablet Take 1 tablet by mouth daily.    [provider]  Cholecalciferol (VITAMIN D) 2000 units tablet Take 2,000 Units by mouth daily.    [provider]  fenofibrate 54 MG tablet Take 1 tablet (54 mg total) by mouth daily. 11/25/19   McGowen, Adrian Blackwater, MD  hydrochlorothiazide (MICROZIDE) 12.5 MG capsule TAKE 1 CAPSULE BY MOUTH EVERY DAY 11/25/19   McGowen, Adrian Blackwater, MD  Multiple Vitamins-Minerals (LUTEIN-ZEAXANTHIN PO) Take by mouth daily.    [provider]  Multiple Vitamins-Minerals (MULTIVITAMIN ADULTS PO) Take by mouth daily.    [provider]  simvastatin (ZOCOR) 40 MG tablet Take 1 tablet (40 mg total) by mouth daily. 11/25/19   McGowen, Adrian Blackwater, MD  Turmeric (QC TUMERIC COMPLEX) 500 MG CAPS Take by mouth.    [provider]    Allergies    Metoclopramide, Erythromycin, Tetracyclines & related, and Lisinopril  Review of Systems   Review of Systems 10 systems reviewed negative except as per HPI Physical Exam Updated Vital Signs BP (!) 167/96 (BP Location: Right Arm)   Pulse 87   Temp 98.1 F (36.7 C) (Oral)   Resp 18   Ht 5\' 5"   (1.651 m)   Wt 68 kg   SpO2 98%   BMI 24.96 kg/m   Physical Exam Constitutional:      Comments: Alert.  Normal in appearance.  No respiratory distress.  Pulmonary:     Effort: Pulmonary effort is normal.  Musculoskeletal:     Comments: Patient has moderate swelling of the dorsum of the hand around the metacarpals.  She has ecchymotic discoloration developing on the palmar surface of the hand  there are some superficial abrasions that show no signs of secondary infection.  Patient can move the fingers without difficulty.  Radial pulses 2+ and strong.  Fingers are slightly cool but this is consistent on both hands and she does not perceive any numbness or tingling.  Skin:    General: Skin is warm and dry.  Neurological:     General: No focal deficit present.     Mental Status: She is oriented to person, place, and time.     Coordination: Coordination normal.  Psychiatric:        Mood and Affect: Mood normal.     ED Results / Procedures / Treatments   Labs (all labs ordered are listed, but only abnormal results are displayed) Labs Reviewed - No data to display  EKG None  Radiology No results found.  Procedures Procedures   Medications Ordered in ED Medications - No data to display  ED Course  I have reviewed the triage vital signs and the nursing notes.  Pertinent labs & imaging results that were available during my care of the patient were reviewed by me and considered in my medical decision making (see chart for details).    MDM Rules/Calculators/A&P                          Amount of swelling and ecchymotic discoloration is consistent with normal fracture appearance.  The bluish discoloration noted by the patient is ecchymosis developing in the palmar aspect of the hand.  She also has some excess edema over the metacarpals from the Ace wrap being fairly snug.  No sign of ischemic changes.  Patient counseled on elevating the extremity and appropriate level of  compression with Ace wrap.  Stable for follow-up with orthopedics. Final Clinical Impression(s) / ED Diagnoses Final diagnoses:  Aftercare for cast or splint check or change  Closed fracture of right wrist with routine healing, subsequent encounter    Rx / DC Orders ED Discharge Orders    None       Charlesetta Shanks, MD 04/23/20 2307

## 2020-04-23 NOTE — ED Triage Notes (Addendum)
Pt via pov from home with right had pain. Pt fell on Saturday and went to ED and had hand set in orthoglass. Pt states her hand began to swell and turn blue today. Hand is cool to the touch and swollen. Pt states her splint is not around the whole arm, and can be removed. Pt has removed it herself and replaced it today. Pt alert & oriented, nad noted.

## 2020-04-23 NOTE — Discharge Instructions (Signed)
1.  The amount of bruising and swelling is normal for your injury.  Try to elevate your hand and wrist is much as possible.  Continue to use your splint and wrap your Ace wrap slightly looser. 2.  Follow-up with orthopedics as planned.

## 2020-04-24 NOTE — Telephone Encounter (Signed)
Pt went to ED last night because it began to swell. Pt states she was able to get instructions on what to do.

## 2020-05-03 ENCOUNTER — Institutional Professional Consult (permissible substitution): Payer: Medicare HMO | Admitting: Plastic Surgery

## 2020-05-16 ENCOUNTER — Encounter: Payer: Self-pay | Admitting: Family Medicine

## 2020-05-16 NOTE — Progress Notes (Signed)
OFFICE VISIT  05/17/2020  CC:  Chief Complaint  Patient presents with  . Follow-up    HTN and HLD    HPI:    Patient is a 75 y.o. Caucasian female who presents for 6 mo f/u HTN and mixed hyperlipidemia. A/P as of last visit: "1) HLD: tolerating fibrate and statin. Continue these and check FLP and hepatic panel today.  2) HTN: good control. Lytes/cr today. Continue hctz 12.5mg  qd.  3) Health maintenance exam: Reviewed age and gender appropriate health maintenance issues (prudent diet, regular exercise, health risks of tobacco and excessive alcohol, use of seatbelts, fire alarms in home, use of sunscreen).  Also reviewed age and gender appropriate health screening as well as vaccine recommendations. Vaccines: ALL UTD. Labs: CBC, CMET, FLP. Cervical ca screening: no further pap/pelvic indicated. Breast ca screening: Mammo NEG 09/2019, plan rpt 09/2020. Colon ca screening: hx of normal colonoscopy 2008.  Cologuard neg 2018.  Rpt Cologuard ordered/pending (10/2019). Osteoporosis screening: pt has osteopenia, next DEXA due approx 08/2020."  INTERIM HX: Labs all good last visit. Has had a busy spring--feels worn out.  Has had 2 cataract surgeries--vision better. Feel and broke navicular bone in R wrist. Mood down some intermittently due to all this that is going on this spring but she states that it is nothing persistent or severe.  BP: "up and down" at home.  Not walking for exercise anymore, not eating as healthy. Avg 130/80 (checks daily) nothing higher than 150 syst and 89 diast, HR 70s. Her bp is usually higher here in office.  HLD: compliant with simva and fenofibr w/out side effect.  ROS as above, plus--> no fevers, no CP, no SOB, no wheezing, no cough, no dizziness, no HAs, no rashes, no melena/hematochezia.  No polyuria or polydipsia.  No myalgias or arthralgias.  No focal weakness, paresthesias, or tremors.  No acute vision or hearing abnormalities.  No dysuria or  unusual/new urinary urgency or frequency.  No recent changes in lower legs. No n/v/d or abd pain.  No palpitations.    Past Medical History:  Diagnosis Date  . Acquired hammertoes of both feet   . Anterior subluxation of right shoulder 07/2017   Relocated by Dr. Tamala Julian  . Cervical disc disorder with radiculopathy    "   "       "      "  . Colon cancer screening    Colonoscopy normal 2008.  Cologuard NEG 06/2016.  Repeat 12/2019 NEG. Consider rpt 2024.  . Diverticulosis   . Duodenal diverticulum 11/2017   pancreatic cyst vs duodenal diverticulum partially obstructing ampulla??  Mild dilatation of CBD and main pancreatic duct.  Repeat MRI 02/2018 showed NORMAL pancreas and confirmed duodenal diverticulum.  CBD normal.  . Family history of abdominal aortic aneurysm    pt has had AAA screening x 2, most recent was 02/2015.  . Fracture of navicular bone of right wrist 2022   [facility]  . Hepatic steatosis    mild (MRI abd 02/2018)  . History of migraine    These resolved after menopause  . HTN (hypertension)    + hypertensive retinopathy  . Hyperlipidemia, mixed   . Insomnia    maintenance problems; trazodone no help  . Intractable hiccups 10/2017   resolved with PPI and reglan  . Left rotator cuff tear arthropathy 04/2015   Dr. Charlann Boxer  . Nephrolithiasis 1994  . Osteopenia 08/2018   DEXA 08/2018 T-score -1.2.  Rpt 08/2020.  Marland Kitchen  Renal cysts, acquired, bilateral 11/12/2017   R kidney with 2.4x 2.4x 2.4cm complex cyst-->MR abd-->Bosniak 1 and 2, no worrisome renal lesion.  Stable on MRI abd 02/2018.  Marland Kitchen Retinal tear of right eye    Laser retinopexy 9/1 and 9/8, 2020.    Past Surgical History:  Procedure Laterality Date  . Aortic ultrasound  02/13/13; 02/2015   no aneurism (screening for high risk)  . BUNIONECTOMY  2006   bilat  . carotid dopplers (screening for PAD)  11/16/2008   "minor left carotid dz" per old records.  No hx of TIA/CVA.  Marland Kitchen CESAREAN SECTION  x 2    [place]  .  COLONOSCOPY  07/2006   recall 10 yrs.  'tics.  (Dr. Domenica Fail in [place], [place])  . CYST EXCISION     right index finger, base of fingernail  . DEXA  08/2018   T-score -1.2.  Rpt 2 yrs.  . kidney stone extraction  1992   Cystoscopy: fragmented ureteral stone.  No procedures for this since then.  . laser retinopexy Right 9/1 and 9/8,2020.    Outpatient Medications Prior to Visit  Medication Sig Dispense Refill  . calcium citrate-vitamin D (CITRACAL+D) 315-200 MG-UNIT per tablet Take 1 tablet by mouth daily.    . Cholecalciferol (VITAMIN D) 2000 units tablet Take 2,000 Units by mouth daily.    . Multiple Vitamins-Minerals (LUTEIN-ZEAXANTHIN PO) Take by mouth daily.    . Multiple Vitamins-Minerals (MULTIVITAMIN ADULTS PO) Take by mouth daily.    . Turmeric 500 MG CAPS Take by mouth.    . fenofibrate 54 MG tablet Take 1 tablet (54 mg total) by mouth daily. 90 tablet 1  . hydrochlorothiazide (MICROZIDE) 12.5 MG capsule TAKE 1 CAPSULE BY MOUTH EVERY DAY 90 capsule 1  . simvastatin (ZOCOR) 40 MG tablet Take 1 tablet (40 mg total) by mouth daily. 90 tablet 1   No facility-administered medications prior to visit.    Allergies  Allergen Reactions  . Metoclopramide Shortness Of Breath    SOB, inc HR, nausea, fatigue  . Erythromycin Other (See Comments)    Abdominal pain  . Tetracyclines & Related Other (See Comments)    Joint pain  . Lisinopril Cough    ROS As per HPI  PE: Vitals with BMI 05/17/2020 04/23/2020 04/23/2020  Height 5\' 5"  - 5\' 5"   Weight 147 lbs 10 oz - 150 lbs  BMI 72.53 - 66.44  Systolic 034 742 595  Diastolic 86 96 97  Pulse 80 87 87     Gen: Alert, well appearing.  Patient is oriented to person, place, time, and situation. AFFECT: pleasant, lucid thought and speech. CV: RRR, no m/r/g.   LUNGS: CTA bilat, nonlabored resps, good aeration in all lung fields. EXT: no clubbing or cyanosis.  no edema.    LABS:  Lab Results  Component Value Date   TSH 2.32 10/26/2017    Lab Results  Component Value Date   WBC 5.9 11/17/2019   HGB 14.9 11/17/2019   HCT 43.2 11/17/2019   MCV 89.3 11/17/2019   PLT 306 11/17/2019   Lab Results  Component Value Date   CREATININE 0.97 (H) 11/17/2019   BUN 22 11/17/2019   NA 140 11/17/2019   K 4.2 11/17/2019   CL 101 11/17/2019   CO2 28 11/17/2019   Lab Results  Component Value Date   ALT 24 11/17/2019   AST 27 11/17/2019   ALKPHOS 43 12/14/2017   BILITOT 0.5 11/17/2019  Lab Results  Component Value Date   CHOL 159 11/17/2019   Lab Results  Component Value Date   HDL 50 11/17/2019   Lab Results  Component Value Date   LDLCALC 83 11/17/2019   Lab Results  Component Value Date   TRIG 164 (H) 11/17/2019   Lab Results  Component Value Date   CHOLHDL 3.2 11/17/2019   IMPRESSION AND PLAN:  1) HTN with white coat component. Home bp's avg 130/80.  Cont hctz 12.5mg  qd. Lytes/cr today.  2) HLD: tolerating fibrate and simva. LDL 83 and trigs 164 six months ago.  Not fasting today. Rpt flp 6 mo.  Hepatic panel today.  An After Visit Summary was printed and given to the patient.  FOLLOW UP: Return in about 6 months (around 11/17/2020) for annual CPE (fasting).  Signed:  Crissie Sickles, MD           05/17/2020

## 2020-05-17 ENCOUNTER — Encounter: Payer: Self-pay | Admitting: Family Medicine

## 2020-05-17 ENCOUNTER — Other Ambulatory Visit: Payer: Self-pay

## 2020-05-17 ENCOUNTER — Ambulatory Visit: Payer: Medicare HMO | Admitting: Family Medicine

## 2020-05-17 VITALS — BP 152/86 | HR 80 | Temp 97.9°F | Resp 16 | Ht 65.0 in | Wt 147.6 lb

## 2020-05-17 DIAGNOSIS — E782 Mixed hyperlipidemia: Secondary | ICD-10-CM

## 2020-05-17 DIAGNOSIS — I10 Essential (primary) hypertension: Secondary | ICD-10-CM | POA: Diagnosis not present

## 2020-05-17 MED ORDER — HYDROCHLOROTHIAZIDE 12.5 MG PO CAPS
ORAL_CAPSULE | ORAL | 3 refills | Status: DC
Start: 2020-05-17 — End: 2021-05-16

## 2020-05-17 MED ORDER — SIMVASTATIN 40 MG PO TABS
40.0000 mg | ORAL_TABLET | Freq: Every day | ORAL | 3 refills | Status: DC
Start: 2020-05-17 — End: 2021-05-16

## 2020-05-17 MED ORDER — FENOFIBRATE 54 MG PO TABS
54.0000 mg | ORAL_TABLET | Freq: Every day | ORAL | 3 refills | Status: DC
Start: 2020-05-17 — End: 2021-05-16

## 2020-05-18 ENCOUNTER — Encounter: Payer: Self-pay | Admitting: Family Medicine

## 2020-05-18 LAB — COMPREHENSIVE METABOLIC PANEL
AG Ratio: 1.6 (calc) (ref 1.0–2.5)
ALT: 19 U/L (ref 6–29)
AST: 20 U/L (ref 10–35)
Albumin: 4.9 g/dL (ref 3.6–5.1)
Alkaline phosphatase (APISO): 46 U/L (ref 37–153)
BUN/Creatinine Ratio: 30 (calc) — ABNORMAL HIGH (ref 6–22)
BUN: 30 mg/dL — ABNORMAL HIGH (ref 7–25)
CO2: 32 mmol/L (ref 20–32)
Calcium: 11.1 mg/dL — ABNORMAL HIGH (ref 8.6–10.4)
Chloride: 98 mmol/L (ref 98–110)
Creat: 1.01 mg/dL — ABNORMAL HIGH (ref 0.60–0.93)
Globulin: 3 g/dL (calc) (ref 1.9–3.7)
Glucose, Bld: 103 mg/dL — ABNORMAL HIGH (ref 65–99)
Potassium: 4.1 mmol/L (ref 3.5–5.3)
Sodium: 139 mmol/L (ref 135–146)
Total Bilirubin: 0.4 mg/dL (ref 0.2–1.2)
Total Protein: 7.9 g/dL (ref 6.1–8.1)

## 2020-10-31 ENCOUNTER — Ambulatory Visit: Payer: Medicare HMO

## 2020-11-20 ENCOUNTER — Other Ambulatory Visit: Payer: Self-pay

## 2020-11-20 ENCOUNTER — Encounter: Payer: Self-pay | Admitting: Family Medicine

## 2020-11-20 ENCOUNTER — Ambulatory Visit (INDEPENDENT_AMBULATORY_CARE_PROVIDER_SITE_OTHER): Payer: Medicare HMO | Admitting: Family Medicine

## 2020-11-20 VITALS — BP 125/79 | HR 63 | Temp 97.9°F | Ht 65.0 in | Wt 149.8 lb

## 2020-11-20 DIAGNOSIS — E2839 Other primary ovarian failure: Secondary | ICD-10-CM | POA: Diagnosis not present

## 2020-11-20 DIAGNOSIS — Z1231 Encounter for screening mammogram for malignant neoplasm of breast: Secondary | ICD-10-CM

## 2020-11-20 DIAGNOSIS — E7800 Pure hypercholesterolemia, unspecified: Secondary | ICD-10-CM | POA: Diagnosis not present

## 2020-11-20 DIAGNOSIS — I10 Essential (primary) hypertension: Secondary | ICD-10-CM

## 2020-11-20 DIAGNOSIS — N183 Chronic kidney disease, stage 3 unspecified: Secondary | ICD-10-CM | POA: Diagnosis not present

## 2020-11-20 DIAGNOSIS — Z0000 Encounter for general adult medical examination without abnormal findings: Secondary | ICD-10-CM

## 2020-11-20 LAB — CBC WITH DIFFERENTIAL/PLATELET
Basophils Absolute: 0.1 10*3/uL (ref 0.0–0.1)
Basophils Relative: 0.9 % (ref 0.0–3.0)
Eosinophils Absolute: 0.1 10*3/uL (ref 0.0–0.7)
Eosinophils Relative: 1.9 % (ref 0.0–5.0)
HCT: 42.4 % (ref 36.0–46.0)
Hemoglobin: 14.3 g/dL (ref 12.0–15.0)
Lymphocytes Relative: 32.2 % (ref 12.0–46.0)
Lymphs Abs: 2 10*3/uL (ref 0.7–4.0)
MCHC: 33.6 g/dL (ref 30.0–36.0)
MCV: 91.3 fl (ref 78.0–100.0)
Monocytes Absolute: 0.6 10*3/uL (ref 0.1–1.0)
Monocytes Relative: 9.5 % (ref 3.0–12.0)
Neutro Abs: 3.5 10*3/uL (ref 1.4–7.7)
Neutrophils Relative %: 55.5 % (ref 43.0–77.0)
Platelets: 298 10*3/uL (ref 150.0–400.0)
RBC: 4.65 Mil/uL (ref 3.87–5.11)
RDW: 13.2 % (ref 11.5–15.5)
WBC: 6.2 10*3/uL (ref 4.0–10.5)

## 2020-11-20 LAB — LIPID PANEL
Cholesterol: 153 mg/dL (ref 0–200)
HDL: 48.1 mg/dL (ref >39.00)
LDL Cholesterol: 73 mg/dL (ref 0–99)
NonHDL: 104.75
Total CHOL/HDL Ratio: 3
Triglycerides: 160 mg/dL — ABNORMAL HIGH (ref 0.0–149.0)
VLDL: 32 mg/dL (ref 0.0–40.0)

## 2020-11-20 LAB — COMPREHENSIVE METABOLIC PANEL
ALT: 23 U/L (ref 0–35)
AST: 24 U/L (ref 0–37)
Albumin: 4.9 g/dL (ref 3.5–5.2)
Alkaline Phosphatase: 40 U/L (ref 39–117)
BUN: 25 mg/dL — ABNORMAL HIGH (ref 6–23)
CO2: 34 mEq/L — ABNORMAL HIGH (ref 19–32)
Calcium: 10.2 mg/dL (ref 8.4–10.5)
Chloride: 98 mEq/L (ref 96–112)
Creatinine, Ser: 0.99 mg/dL (ref 0.40–1.20)
GFR: 55.89 mL/min — ABNORMAL LOW (ref >60.00)
Glucose, Bld: 73 mg/dL (ref 70–99)
Potassium: 4.2 mEq/L (ref 3.5–5.1)
Sodium: 139 mEq/L (ref 135–145)
Total Bilirubin: 0.5 mg/dL (ref 0.2–1.2)
Total Protein: 7.8 g/dL (ref 6.0–8.3)

## 2020-11-20 NOTE — Progress Notes (Signed)
Office Note 11/20/2020  CC:  Chief Complaint  Patient presents with   Annual Exam    Pt is fasting    HPI:  Patient is a 75 y.o. female who is here for annual health maintenance exam and 6 mo f/u CRI III, HTN, HLD. A/P as of last visit: "1) HTN with white coat component. Home bp's avg 130/80.  Cont hctz 12.5mg  qd. Lytes/cr today.   2) HLD: tolerating fibrate and simva. LDL 83 and trigs 164 six months ago.  Not fasting today. Rpt flp 6 mo.  Hepatic panel today."  INTERIM HX: Schmelzle is doing great.   Home bp monitoring: daily bp and hr checks reviewed-->avg <130/80, avg hr 70 No problems taking fenofibrate, Zocor, and HCTZ.  Past Medical History:  Diagnosis Date   Acquired hammertoes of both feet    Anterior subluxation of right shoulder 07/2017   Relocated by Dr. Tamala Julian   Cervical disc disorder with radiculopathy    "   "       "      "   Chronic renal insufficiency, stage 3 (moderate) (HCC)    GFR 50s (avg sCr around 1.0)   Colon cancer screening    Colonoscopy normal 2008.  Cologuard NEG 06/2016.  Repeat 12/2019 NEG. Consider rpt 2024.   Diverticulosis    Duodenal diverticulum 11/2017   pancreatic cyst vs duodenal diverticulum partially obstructing ampulla??  Mild dilatation of CBD and main pancreatic duct.  Repeat MRI 02/2018 showed NORMAL pancreas and confirmed duodenal diverticulum.  CBD normal.   Family history of abdominal aortic aneurysm    pt has had AAA screening x 2, most recent was 02/2015.   Fracture of navicular bone of right wrist 2022   [facility]   Hepatic steatosis    mild (MRI abd 02/2018)   History of migraine    These resolved after menopause   HTN (hypertension)    + hypertensive retinopathy   Hyperlipidemia, mixed    Insomnia    maintenance problems; trazodone no help   Intractable hiccups 10/2017   resolved with PPI and reglan   Left rotator cuff tear arthropathy 04/2015   Dr. Charlann Boxer   Nephrolithiasis 1994   Osteopenia 08/2018   DEXA  08/2018 T-score -1.2.  Rpt 08/2020.   Renal cysts, acquired, bilateral 11/12/2017   R kidney with 2.4x 2.4x 2.4cm complex cyst-->MR abd-->Bosniak 1 and 2, no worrisome renal lesion.  Stable on MRI abd 02/2018.   Retinal tear of right eye    Laser retinopexy 9/1 and 9/8, 2020.    Past Surgical History:  Procedure Laterality Date   Aortic ultrasound  02/13/13; 02/2015   no aneurism (screening for high risk)   BUNIONECTOMY  2006   bilat   carotid dopplers (screening for PAD)  11/16/2008   "minor left carotid dz" per old records.  No hx of TIA/CVA.   CESAREAN SECTION  x 2    [place]  07/2006   recall 10 yrs.  'tics.  (Dr. Domenica Fail in [place], [place])   [place]     right index finger, base of fingernail   DEXA  08/2018   T-score -1.2.  Rpt 2 yrs.   kidney stone extraction  1992   Cystoscopy: fragmented ureteral stone.  No procedures for this since then.   laser retinopexy Right 9/1 and 9/8,2020.    Family History  Problem Relation Age of Onset   Cancer Mother        ?  ovarian vs endometrial   Hyperlipidemia Father    Hypertension Father    Bladder Cancer Brother    AAA (abdominal aortic aneurysm) Brother    Colon cancer Other        Negative history.   Diabetes Other    Kidney disease Brother    AAA (abdominal aortic aneurysm) Brother    Dementia Brother    Heart disease Brother    AAA (abdominal aortic aneurysm) Brother    Diabetes Brother    Hypertension Brother     Social History   Socioeconomic History   Marital status: Married    Spouse name: Not on file   Number of children: Not on file   Years of education: Not on file   Highest education level: Not on file  Occupational History   Not on file  Tobacco Use   Smoking status: Never   Smokeless tobacco: Never  Vaping Use   Vaping Use: Never used  Substance and Sexual Activity   Alcohol use: Yes    Alcohol/week: 0.0 standard drinks    Comment: rarely   Drug use: Never   Sexual activity: Not  on file  Other Topics Concern   Not on file  Social History Narrative   Married, 2 daughters.  Four older brothers, 3 of which have had abdominal aneurisms.   Relocated from [place] area 05/2013.   Occupation: retired Research officer, political party.   No tob, occ alcohol.     Social Determinants of Health   Financial Resource Strain: Not on file  Food Insecurity: Not on file  Transportation Needs: Not on file  Physical Activity: Not on file  Stress: Not on file  Social Connections: Not on file  Intimate Partner Violence: Not on file    Outpatient Medications Prior to Visit  Medication Sig Dispense Refill   calcium citrate-vitamin D (CITRACAL+D) 315-200 MG-UNIT per tablet Take 1 tablet by mouth daily.     Cholecalciferol (VITAMIN D) 2000 units tablet Take 2,000 Units by mouth daily.     fenofibrate 54 MG tablet Take 1 tablet (54 mg total) by mouth daily. 90 tablet 3   hydrochlorothiazide (MICROZIDE) 12.5 MG capsule TAKE 1 CAPSULE BY MOUTH EVERY DAY 90 capsule 3   Multiple Vitamins-Minerals (LUTEIN-ZEAXANTHIN PO) Take by mouth daily.     Multiple Vitamins-Minerals (MULTIVITAMIN ADULTS PO) Take by mouth daily.     simvastatin (ZOCOR) 40 MG tablet Take 1 tablet (40 mg total) by mouth daily. 90 tablet 3   Turmeric 500 MG CAPS Take by mouth.     No facility-administered medications prior to visit.    Allergies  Allergen Reactions   Metoclopramide Shortness Of Breath    SOB, inc HR, nausea, fatigue   Erythromycin Other (See Comments)    Abdominal pain   Tetracyclines & Related Other (See Comments)    Joint pain   Lisinopril Cough    ROS Review of Systems  Constitutional:  Negative for appetite change, chills, fatigue and fever.  HENT:  Negative for congestion, dental problem, ear pain and sore throat.   Eyes:  Negative for discharge, redness and visual disturbance.  Respiratory:  Negative for cough, chest tightness, shortness of breath and wheezing.   Cardiovascular:  Negative for  chest pain, palpitations and leg swelling.  Gastrointestinal:  Negative for abdominal pain, blood in stool, diarrhea, nausea and vomiting.  Genitourinary:  Negative for difficulty urinating, dysuria, flank pain, frequency, hematuria and urgency.  Musculoskeletal:  Negative for arthralgias, back pain, joint  swelling, myalgias and neck stiffness.  Skin:  Negative for pallor and rash.  Neurological:  Negative for dizziness, speech difficulty, weakness and headaches.  Hematological:  Negative for adenopathy. Does not bruise/bleed easily.  Psychiatric/Behavioral:  Negative for confusion and sleep disturbance. The patient is not nervous/anxious.    PE; Vitals with BMI 11/20/2020 05/17/2020 04/23/2020  Height 5\' 5"  5\' 5"  -  Weight 149 lbs 13 oz 147 lbs 10 oz -  BMI 64.40 34.74 -  Systolic 259 563 875  Diastolic 79 86 96  Pulse 63 80 87     Gen: Alert, well appearing.  Patient is oriented to person, place, time, and situation. AFFECT: pleasant, lucid thought and speech. ENT: Ears: EACs clear, normal epithelium.  TMs with good light reflex and landmarks bilaterally.  Eyes: no injection, icteris, swelling, or exudate.  EOMI, PERRLA. Nose: no drainage or turbinate edema/swelling.  No injection or focal lesion.  Mouth: lips without lesion/swelling.  Oral mucosa pink and moist.  Dentition intact and without obvious caries or gingival swelling.  Oropharynx without erythema, exudate, or swelling.  Neck: supple/nontender.  No LAD, mass, or TM.  Carotid pulses 2+ bilaterally, without bruits. CV: RRR, no m/r/g.   LUNGS: CTA bilat, nonlabored resps, good aeration in all lung fields. ABD: soft, NT, ND, BS normal.  No hepatospenomegaly or mass.  No bruits. EXT: no clubbing, cyanosis, or edema.  Musculoskeletal: no joint swelling, erythema, warmth, or tenderness.  ROM of all joints intact. Skin - no sores or suspicious lesions or rashes or color changes  Pertinent labs:  Lab Results  Component Value Date    TSH 2.32 10/26/2017   Lab Results  Component Value Date   WBC 5.9 11/17/2019   HGB 14.9 11/17/2019   HCT 43.2 11/17/2019   MCV 89.3 11/17/2019   PLT 306 11/17/2019   Lab Results  Component Value Date   CREATININE 1.01 (H) 05/17/2020   BUN 30 (H) 05/17/2020   NA 139 05/17/2020   K 4.1 05/17/2020   CL 98 05/17/2020   CO2 32 05/17/2020   Lab Results  Component Value Date   ALT 19 05/17/2020   AST 20 05/17/2020   ALKPHOS 43 12/14/2017   BILITOT 0.4 05/17/2020   Lab Results  Component Value Date   CHOL 159 11/17/2019   Lab Results  Component Value Date   HDL 50 11/17/2019   Lab Results  Component Value Date   LDLCALC 83 11/17/2019   Lab Results  Component Value Date   TRIG 164 (H) 11/17/2019   Lab Results  Component Value Date   CHOLHDL 3.2 11/17/2019    ASSESSMENT AND PLAN:   1) hypertension.  Well-controlled on 12.5 mg HCTZ daily. Electrolytes and creatinine checked today.  2.  Hyperlipidemia, mixed.  Tolerating fenofibrate 4 mg a day and Zocor 40 mg a day. Lipid and hepatic panel checked today.  3. Health maintenance exam: Reviewed age and gender appropriate health maintenance issues (prudent diet, regular exercise, health risks of tobacco and excessive alcohol, use of seatbelts, fire alarms in home, use of sunscreen).  Also reviewed age and gender appropriate health screening as well as vaccine recommendations. Vaccines: ALL UTD. Labs: CBC, CMET, FLP. Cervical ca screening: no further pap/pelvic indicated. Breast ca screening: Mammo NEG 09/2019--Ordered today. Colon ca screening: hx of normal colonoscopy 2008.  Cologuard neg 2018 and 2021.  Consider rpt cologuard 2024. Osteoporosis screening: pt has osteopenia (2020), next DEXA due-->ordered today.  An After Visit Summary was printed  and given to the patient.  FOLLOW UP:  Return in about 6 months (around 05/20/2021) for routine chronic illness f/u.  Signed:  Crissie Sickles, MD           11/20/2020

## 2020-11-20 NOTE — Patient Instructions (Signed)

## 2020-11-21 ENCOUNTER — Ambulatory Visit (INDEPENDENT_AMBULATORY_CARE_PROVIDER_SITE_OTHER): Payer: Medicare HMO

## 2020-11-21 ENCOUNTER — Ambulatory Visit: Payer: Medicare HMO

## 2020-11-21 DIAGNOSIS — Z0000 Encounter for general adult medical examination without abnormal findings: Secondary | ICD-10-CM

## 2020-11-21 NOTE — Progress Notes (Signed)
Virtual Visit via Telephone Note  I connected with  Milderd Manocchio Rodriguez on 11/21/20 at 10:15 AM EST by telephone and verified that I am speaking with the correct person using two identifiers.  Medicare Annual Wellness visit completed telephonically due to Covid-19 pandemic.   Persons participating in this call: This Health Coach and this patient.   Location: Patient: Home Provider: Office   I discussed the limitations, risks, security and privacy concerns of performing an evaluation and management service by telephone and the availability of in person appointments. The patient expressed understanding and agreed to proceed.  Unable to perform video visit due to video visit attempted and failed and/or patient does not have video capability.   Some vital signs may be absent or patient reported.   Willette Brace, LPN   Subjective:   Colbi Schiltz is a 75 y.o. female who presents for Medicare Annual (Subsequent) preventive examination.  Review of Systems     Cardiac Risk Factors include: advanced age (>2men, >60 women);hypertension;dyslipidemia     Objective:    There were no vitals filed for this visit. There is no height or weight on file to calculate BMI.  Advanced Directives 11/21/2020 04/23/2020 10/26/2019 12/14/2017  Does Patient Have a Medical Advance Directive? Yes No;Yes Yes Yes  Type of Paramedic of [place];Living will [place];Living will [place];Living will Living will;Healthcare Power of [place] in Chart? No - copy requested - Yes - validated most recent copy scanned in chart (See row information) No - copy requested    Current Medications (verified) Outpatient Encounter Medications as of 11/21/2020  Medication Sig   calcium citrate-vitamin D (CITRACAL+D) 315-200 MG-UNIT per tablet Take 1 tablet by mouth daily.   Cholecalciferol (VITAMIN D) 2000 units  tablet Take 2,000 Units by mouth daily.   fenofibrate 54 MG tablet Take 1 tablet (54 mg total) by mouth daily.   hydrochlorothiazide (MICROZIDE) 12.5 MG capsule TAKE 1 CAPSULE BY MOUTH EVERY DAY   Multiple Vitamins-Minerals (LUTEIN-ZEAXANTHIN PO) Take by mouth daily.   Multiple Vitamins-Minerals (MULTIVITAMIN ADULTS PO) Take by mouth daily.   simvastatin (ZOCOR) 40 MG tablet Take 1 tablet (40 mg total) by mouth daily.   Turmeric 500 MG CAPS Take by mouth.   No facility-administered encounter medications on file as of 11/21/2020.    Allergies (verified) Metoclopramide, Erythromycin, Tetracyclines & related, and Lisinopril   History: Past Medical History:  Diagnosis Date   Acquired hammertoes of both feet    Anterior subluxation of right shoulder 07/2017   Relocated by Dr. Tamala Julian   Cervical disc disorder with radiculopathy    "   "       "      "   Chronic renal insufficiency, stage 3 (moderate) (HCC)    GFR 50s (avg sCr around 1.0)   Colon cancer screening    Colonoscopy normal 2008.  Cologuard NEG 06/2016.  Repeat 12/2019 NEG. Consider rpt 2024.   Diverticulosis    Duodenal diverticulum 11/2017   pancreatic cyst vs duodenal diverticulum partially obstructing ampulla??  Mild dilatation of CBD and main pancreatic duct.  Repeat MRI 02/2018 showed NORMAL pancreas and confirmed duodenal diverticulum.  CBD normal.   Family history of abdominal aortic aneurysm    pt has had AAA screening x 2, most recent was 02/2015.   Fracture of navicular bone of right wrist 2022   [facility]   Hepatic steatosis  mild (MRI abd 02/2018)   History of migraine    These resolved after menopause   HTN (hypertension)    + hypertensive retinopathy   Hyperlipidemia, mixed    Insomnia    maintenance problems; trazodone no help   Intractable hiccups 10/2017   resolved with PPI and reglan   Left rotator cuff tear arthropathy 04/2015   Dr. Charlann Boxer   Nephrolithiasis 1994   Osteopenia 08/2018   DEXA 08/2018  T-score -1.2.  Rpt 08/2020.   Renal cysts, acquired, bilateral 11/12/2017   R kidney with 2.4x 2.4x 2.4cm complex cyst-->MR abd-->Bosniak 1 and 2, no worrisome renal lesion.  Stable on MRI abd 02/2018.   Retinal tear of right eye    Laser retinopexy 9/1 and 9/8, 2020.   Past Surgical History:  Procedure Laterality Date   Aortic ultrasound  02/13/13; 02/2015   no aneurism (screening for high risk)   BUNIONECTOMY  2006   bilat   carotid dopplers (screening for PAD)  11/16/2008   "minor left carotid dz" per old records.  No hx of TIA/CVA.   CESAREAN SECTION  x 2    [place]  07/2006   recall 10 yrs.  'tics.  (Dr. Domenica Fail in [place], [place])   [place]     right index finger, base of fingernail   DEXA  08/2018   T-score -1.2.  Rpt 2 yrs.   kidney stone extraction  1992   Cystoscopy: fragmented ureteral stone.  No procedures for this since then.   laser retinopexy Right 9/1 and 9/8,2020.   Family History  Problem Relation Age of Onset   Cancer Mother        ? ovarian vs endometrial   Hyperlipidemia Father    Hypertension Father    Bladder Cancer Brother    AAA (abdominal aortic aneurysm) Brother    Colon cancer Other        Negative history.   Diabetes Other    Kidney disease Brother    AAA (abdominal aortic aneurysm) Brother    Dementia Brother    Heart disease Brother    AAA (abdominal aortic aneurysm) Brother    Diabetes Brother    Hypertension Brother    Social History   Socioeconomic History   Marital status: Married    Spouse name: Not on file   Number of children: Not on file   Years of education: Not on file   Highest education level: Not on file  Occupational History   Not on file  Tobacco Use   Smoking status: Never   Smokeless tobacco: Never  Vaping Use   Vaping Use: Never used  Substance and Sexual Activity   Alcohol use: Yes    Alcohol/week: 0.0 standard drinks    Comment: rarely   Drug use: Never   Sexual activity: Not on file   Other Topics Concern   Not on file  Social History Narrative   Married, 2 daughters.  Four older brothers, 3 of which have had abdominal aneurisms.   Relocated from [place] area 05/2013.   Occupation: retired Research officer, political party.   No tob, occ alcohol.     Social Determinants of Health   Financial Resource Strain: Low Risk    Difficulty of Paying Living Expenses: Not hard at all  Food Insecurity: No Food Insecurity   Worried About Charity fundraiser in the Last Year: Never true   [place] in the Last Year:  Never true  Transportation Needs: No Transportation Needs   Lack of Transportation (Medical): No   Lack of Transportation (Non-Medical): No  Physical Activity: Sufficiently Active   Days of Exercise per Week: 5 days   Minutes of Exercise per Session: 30 min  Stress: No Stress Concern Present   Feeling of Stress : Not at all  Social Connections: Moderately Isolated   Frequency of Communication with Friends and Family: More than three times a week   Frequency of Social Gatherings with Friends and Family: Three times a week   Attends Religious Services: Never   Active Member of Clubs or Organizations: No   Attends Archivist Meetings: Never   Marital Status: Married    Tobacco Counseling Counseling given: Not Answered   Clinical Intake:  Pre-visit preparation completed: Yes  Pain : No/denies pain     BMI - recorded: 24.56 Nutritional Status: BMI of 19-24  Normal Nutritional Risks: None Diabetes: No  How often do you need to have someone help you when you read instructions, pamphlets, or other written materials from your doctor or pharmacy?: 1 - Never  Diabetic?No  Interpreter Needed?: No  Information entered by :: Charlott Rakes, LPN   Activities of Daily Living In your present state of health, do you have any difficulty performing the following activities: 11/21/2020  Hearing? Y  Comment slight HOH  Vision? N  Difficulty concentrating  or making decisions? N  Walking or climbing stairs? N  Dressing or bathing? N  Doing errands, shopping? N  Preparing Food and eating ? N  Using the Toilet? N  In the past six months, have you accidently leaked urine? Y  Comment wears a pad  Do you have problems with loss of bowel control? N  Managing your Medications? N  Managing your Finances? N  Housekeeping or managing your Housekeeping? N  Some recent data might be hidden    Patient Care Team: Tammi Sou, MD as PCP - General (Family Medicine) Harriett Sine, MD as Consulting Physician (Dermatology) Azucena Fallen, MD as Consulting Physician (Obstetrics and Gynecology) Lyndal Pulley, DO as Consulting Physician (Sports Medicine) Wallene Huh, DPM as Consulting Physician (Podiatry) Otelia Sergeant, OD as Referring Physician Lucilla Lame (Dentistry) Princess Bruins, MD as Consulting Physician (Ophthalmology)  Indicate any recent Medical Services you may have received from other than [facility] providers in the past year (date may be approximate).     Assessment:   This is a routine wellness examination for Deijah.  Hearing/Vision screen Hearing Screening - Comments:: Pt stated HOH  Vision Screening - Comments:: Pt follows up with Dr Eulas Post for annual eye exams   Dietary issues and exercise activities discussed: Current Exercise Habits: Home exercise routine, Type of exercise: walking, Time (Minutes): 30, Frequency (Times/Week): 5, Weekly Exercise (Minutes/Week): 150   Goals Addressed             This Visit's Progress    Patient Stated       Lose weight and keep cholesterol down        Depression Screen PHQ 2/9 Scores 11/21/2020 11/20/2020 10/26/2019 06/22/2018 12/14/2017 06/12/2017 12/11/2016  PHQ - 2 Score 0 0 1 1 0 0 0    Fall Risk Fall Risk  11/21/2020 11/20/2020 10/26/2019 06/22/2018 12/14/2017  Falls in the past year? 1 1 0 1 0  Number falls in past yr: 1 0 0 0 -  Injury with Fall? 1 1 0 0 -  Comment broke  a  bone in wrist - - - -  Risk for fall due to : Impaired vision;Impaired balance/gait - - - -  Follow up Falls prevention discussed Falls evaluation completed Falls prevention discussed Falls evaluation completed -    FALL RISK PREVENTION PERTAINING TO THE HOME:  Any stairs in or around the home? Yes  If so, are there any without handrails? No  Home free of loose throw rugs in walkways, pet beds, electrical cords, etc? Yes  Adequate lighting in your home to reduce risk of falls? Yes   ASSISTIVE DEVICES UTILIZED TO PREVENT FALLS:  Life alert? No  Use of a cane, walker or w/c? No  Grab bars in the bathroom? No  Shower chair or bench in shower? No  Elevated toilet seat or a handicapped toilet? No   TIMED UP AND GO:  Was the test performed? No .   Cognitive Function: MMSE - Mini Mental State Exam 12/14/2017  Orientation to time 5  Orientation to Place 5  Registration 3  Attention/ Calculation 5  Recall 3  Language- name 2 objects 2  Language- repeat 1  Language- follow 3 step command 3  Language- read & follow direction 1  Write a sentence 1  Copy design 1  Total score 30     6CIT Screen 11/21/2020  What Year? 0 points  What month? 0 points  What time? 0 points  Count back from 20 0 points  Months in reverse 0 points  Repeat phrase 0 points  Total Score 0    Immunizations Immunization History  Administered Date(s) Administered   Fluad Quad(high Dose 65+) 10/31/2020   Influenza, High Dose Seasonal PF 10/09/2016, 09/19/2017, 11/03/2019   Influenza-Unspecified 09/24/2015   PFIZER(Purple Top)SARS-COV-2 Vaccination 01/18/2019, 02/15/2019   Pneumococcal Conjugate-13 11/09/2013   Pneumococcal Polysaccharide-23 06/15/2015   Tdap 05/21/2019, 04/21/2020   Zoster Recombinat (Shingrix) 01/31/2018, 07/08/2018    TDAP status: Up to date  Flu Vaccine status: Up to date  Pneumococcal vaccine status: Up to date  Covid-19 vaccine status: Completed vaccines  Qualifies  for Shingles Vaccine? Yes   Zostavax completed Yes   Shingrix Completed?: Yes  Screening Tests Health Maintenance  Topic Date Due   COVID-19 Vaccine (3 - Booster for Pfizer series) 12/06/2020 (Originally 04/12/2019)   TETANUS/TDAP  04/22/2030   Pneumonia Vaccine 2+ Years old  Completed   INFLUENZA VACCINE  Completed   DEXA SCAN  Completed   Hepatitis C Screening  Completed   Zoster Vaccines- Shingrix  Completed   HPV VACCINES  Aged Out   COLONOSCOPY (Pts 45-57yrs Insurance coverage will need to be confirmed)  Discontinued    Health Maintenance  There are no preventive care reminders to display for this patient.  Colorectal cancer screening: No longer required.  Pt states every 3 years for cologuard   Mammogram status: Completed 09/07/19. Repeat every year  Bone Density status: Completed 08/23/18. Results reflect: Bone density results: OSTEOPENIA. Repeat every 2 years.   Additional Screening:  Hepatitis C Screening:  Completed 12/04/15  Vision Screening: Recommended annual ophthalmology exams for early detection of glaucoma and other disorders of the eye. Is the patient up to date with their annual eye exam?  Yes  Who is the provider or what is the name of the office in which the patient attends annual eye exams? Dr Eulas Post If pt is not established with a provider, would they like to be referred to a provider to establish care? No .   Dental Screening: Recommended annual  dental exams for proper oral hygiene  Community Resource Referral / Chronic Care Management: CRR required this visit?  No   CCM required this visit?  No      Plan:     I have personally reviewed and noted the following in the patient's chart:   Medical and social history Use of alcohol, tobacco or illicit drugs  Current medications and supplements including opioid prescriptions.  Functional ability and status Nutritional status Physical activity Advanced directives List of other  physicians Hospitalizations, surgeries, and ER visits in previous 12 months Vitals Screenings to include cognitive, depression, and falls Referrals and appointments  In addition, I have reviewed and discussed with patient certain preventive protocols, quality metrics, and best practice recommendations. A written personalized care plan for preventive services as well as general preventive health recommendations were provided to patient.     Willette Brace, LPN   [id number]   Nurse Notes: None

## 2020-11-21 NOTE — Patient Instructions (Signed)
Karen Bell , Thank you for taking time to come for your Medicare Wellness Visit. I appreciate your ongoing commitment to your health goals. Please review the following plan we discussed and let me know if I can assist you in the future.   Screening recommendations/referrals: Colonoscopy: No longer required , pt does cologuard every 3 years  Recommended yearly ophthalmology/optometry visit for glaucoma screening and checkup Recommended yearly dental visit for hygiene and checkup  Vaccinations: Influenza vaccine: Done 10/31/20 Pneumococcal vaccine: Up to date Tdap vaccine: Done 04/21/20 repeat every 10 years  Shingles vaccine: Done 1/26 & 07/08/18   Covid-19:Completed 1/12 & 02/15/19  Advanced directives: Please bring a copy of your health care power of attorney and living will to the office at your convenience.  Conditions/risks identified: Lose weight and keep cholesterol down   Next appointment: Follow up in one year for your annual wellness visit    Preventive Care 65 Years and Older, Female Preventive care refers to lifestyle choices and visits with your health care provider that can promote health and wellness. What does preventive care include? A yearly physical exam. This is also called an annual well check. Dental exams once or twice a year. Routine eye exams. Ask your health care provider how often you should have your eyes checked. Personal lifestyle choices, including: Daily care of your teeth and gums. Regular physical activity. Eating a healthy diet. Avoiding tobacco and drug use. Limiting alcohol use. Practicing safe sex. Taking low-dose aspirin every day. Taking vitamin and mineral supplements as recommended by your health care provider. What happens during an annual well check? The services and screenings done by your health care provider during your annual well check will depend on your age, overall health, lifestyle risk factors, and family history of  disease. Counseling  Your health care provider may ask you questions about your: Alcohol use. Tobacco use. Drug use. Emotional well-being. Home and relationship well-being. Sexual activity. Eating habits. History of falls. Memory and ability to understand (cognition). Work and work Statistician. Reproductive health. Screening  You may have the following tests or measurements: Height, weight, and BMI. Blood pressure. Lipid and cholesterol levels. These may be checked every 5 years, or more frequently if you are over 65 years old. Skin check. Lung cancer screening. You may have this screening every year starting at age 5 if you have a 30-pack-year history of smoking and currently smoke or have quit within the past 15 years. Fecal occult blood test (FOBT) of the stool. You may have this test every year starting at age 84. Flexible sigmoidoscopy or colonoscopy. You may have a sigmoidoscopy every 5 years or a colonoscopy every 10 years starting at age 77. Hepatitis C blood test. Hepatitis B blood test. Sexually transmitted disease (STD) testing. Diabetes screening. This is done by checking your blood sugar (glucose) after you have not eaten for a while (fasting). You may have this done every 1-3 years. Bone density scan. This is done to screen for osteoporosis. You may have this done starting at age 67. Mammogram. This may be done every 1-2 years. Talk to your health care provider about how often you should have regular mammograms. Talk with your health care provider about your test results, treatment options, and if necessary, the need for more tests. Vaccines  Your health care provider may recommend certain vaccines, such as: Influenza vaccine. This is recommended every year. Tetanus, diphtheria, and acellular pertussis (Tdap, Td) vaccine. You may need a Td booster every 10  years. Zoster vaccine. You may need this after age 75. Pneumococcal 13-valent conjugate (PCV13) vaccine. One  dose is recommended after age 63. Pneumococcal polysaccharide (PPSV23) vaccine. One dose is recommended after age 110. Talk to your health care provider about which screenings and vaccines you need and how often you need them. This information is not intended to replace advice given to you by your health care provider. Make sure you discuss any questions you have with your health care provider. Document Released: 01/19/2015 Document Revised: 09/12/2015 Document Reviewed: 10/24/2014 Elsevier Interactive Patient Education  2017 [place] Prevention in the Home Falls can cause injuries. They can happen to people of all ages. There are many things you can do to make your home safe and to help prevent falls. What can I do on the outside of my home? Regularly fix the edges of walkways and driveways and fix any cracks. Remove anything that might make you trip as you walk through a door, such as a raised step or threshold. Trim any bushes or trees on the path to your home. Use bright outdoor lighting. Clear any walking paths of anything that might make someone trip, such as rocks or tools. Regularly check to see if handrails are loose or broken. Make sure that both sides of any steps have handrails. Any raised decks and porches should have guardrails on the edges. Have any leaves, snow, or ice cleared regularly. Use sand or salt on walking paths during winter. Clean up any spills in your garage right away. This includes oil or grease spills. What can I do in the bathroom? Use night lights. Install grab bars by the toilet and in the tub and shower. Do not use towel bars as grab bars. Use non-skid mats or decals in the tub or shower. If you need to sit down in the shower, use a plastic, non-slip stool. Keep the floor dry. Clean up any water that spills on the floor as soon as it happens. Remove soap buildup in the tub or shower regularly. Attach bath mats securely with double-sided  non-slip rug tape. Do not have throw rugs and other things on the floor that can make you trip. What can I do in the bedroom? Use night lights. Make sure that you have a light by your bed that is easy to reach. Do not use any sheets or blankets that are too big for your bed. They should not hang down onto the floor. Have a firm chair that has side arms. You can use this for support while you get dressed. Do not have throw rugs and other things on the floor that can make you trip. What can I do in the kitchen? Clean up any spills right away. Avoid walking on wet floors. Keep items that you use a lot in easy-to-reach places. If you need to reach something above you, use a strong step stool that has a grab bar. Keep electrical cords out of the way. Do not use floor polish or wax that makes floors slippery. If you must use wax, use non-skid floor wax. Do not have throw rugs and other things on the floor that can make you trip. What can I do with my stairs? Do not leave any items on the stairs. Make sure that there are handrails on both sides of the stairs and use them. Fix handrails that are broken or loose. Make sure that handrails are as long as the stairways. Check any carpeting to make sure  that it is firmly attached to the stairs. Fix any carpet that is loose or worn. Avoid having throw rugs at the top or bottom of the stairs. If you do have throw rugs, attach them to the floor with carpet tape. Make sure that you have a light switch at the top of the stairs and the bottom of the stairs. If you do not have them, ask someone to add them for you. What else can I do to help prevent falls? Wear shoes that: Do not have high heels. Have rubber bottoms. Are comfortable and fit you well. Are closed at the toe. Do not wear sandals. If you use a stepladder: Make sure that it is fully opened. Do not climb a closed stepladder. Make sure that both sides of the stepladder are locked into place. Ask  someone to hold it for you, if possible. Clearly mark and make sure that you can see: Any grab bars or handrails. First and last steps. Where the edge of each step is. Use tools that help you move around (mobility aids) if they are needed. These include: Canes. Walkers. Scooters. Crutches. Turn on the lights when you go into a dark area. Replace any light bulbs as soon as they burn out. Set up your furniture so you have a clear path. Avoid moving your furniture around. If any of your floors are uneven, fix them. If there are any pets around you, be aware of where they are. Review your medicines with your doctor. Some medicines can make you feel dizzy. This can increase your chance of falling. Ask your doctor what other things that you can do to help prevent falls. This information is not intended to replace advice given to you by your health care provider. Make sure you discuss any questions you have with your health care provider. Document Released: 10/19/2008 Document Revised: 05/31/2015 Document Reviewed: 01/27/2014 Elsevier Interactive Patient Education  2017 [employer].

## 2021-01-15 ENCOUNTER — Ambulatory Visit
Admission: RE | Admit: 2021-01-15 | Discharge: 2021-01-15 | Disposition: A | Discharge status: Home / Self Care | Payer: Medicare HMO | Source: Ambulatory Visit | Attending: Family Medicine | Admitting: Family Medicine

## 2021-01-15 DIAGNOSIS — Z1231 Encounter for screening mammogram for malignant neoplasm of breast: Secondary | ICD-10-CM

## 2021-04-03 ENCOUNTER — Encounter: Payer: Self-pay | Admitting: Family Medicine

## 2021-04-03 ENCOUNTER — Ambulatory Visit (INDEPENDENT_AMBULATORY_CARE_PROVIDER_SITE_OTHER): Payer: Medicare HMO | Admitting: Family Medicine

## 2021-04-03 VITALS — BP 144/85 | HR 68 | Temp 97.9°F | Ht 65.0 in | Wt 149.4 lb

## 2021-04-03 DIAGNOSIS — M7041 Prepatellar bursitis, right knee: Secondary | ICD-10-CM

## 2021-04-03 NOTE — Progress Notes (Signed)
OFFICE VISIT ? ?04/03/2021 ? ?CC:  ?Chief Complaint  ?Patient presents with  ? Knee Pain  ?  Right, 3 days. Warm to touch and swelling. At home treatment: ibu bid, ice, elevation. Hurts to bend. Denies any injury or fall.  ? ?Patient is a 76 y.o. female who presents for right knee pain. ? ?HPI: ?Noted pain and swelling starting on the front of her right knee about 4 days ago.  Gradually increasing.  Hurting to bend and walk.  No fever, chills, or malaise.  No redness over around the knee.  No preceding injury or overuse.  She usually walks for 30 minutes a day.  No trauma to the knees, no prolonged activity on the knees lately.  No history of gout.  ?No history of problems with osteoarthritis of the knees. ? ?She took ibuprofen 200 mg morning and evening since this started.  She applied ice once or twice ? ?Past Medical History:  ?Diagnosis Date  ? Acquired hammertoes of both feet   ? Anterior subluxation of right shoulder 07/2017  ? Relocated by Dr. Tamala Julian  ? Cervical disc disorder with radiculopathy    "  ? "       "      "  ? Chronic renal insufficiency, stage 3 (moderate) (HCC)   ? GFR 50s (avg sCr around 1.0)  ? Colon cancer screening   ? Colonoscopy normal 2008.  Cologuard NEG 06/2016.  Repeat 12/2019 NEG. Consider rpt 2024.  ? Diverticulosis   ? Duodenal diverticulum 11/2017  ? pancreatic cyst vs duodenal diverticulum partially obstructing ampulla??  Mild dilatation of CBD and main pancreatic duct.  Repeat MRI 02/2018 showed NORMAL pancreas and confirmed duodenal diverticulum.  CBD normal.  ? Family history of abdominal aortic aneurysm   ? pt has had AAA screening x 2, most recent was 02/2015.  ? Fracture of navicular bone of right wrist 2022  ? WFBU  ? Hepatic steatosis   ? mild (MRI abd 02/2018)  ? History of migraine   ? These resolved after menopause  ? HTN (hypertension)   ? + hypertensive retinopathy  ? Hyperlipidemia, mixed   ? Insomnia   ? maintenance problems; trazodone no help  ? Intractable hiccups  10/2017  ? resolved with PPI and reglan  ? Left rotator cuff tear arthropathy 04/2015  ? Dr. Charlann Boxer  ? Nephrolithiasis 1994  ? Osteopenia 08/2018  ? DEXA 08/2018 T-score -1.2.  Rpt 08/2020.  ? Renal cysts, acquired, bilateral 11/12/2017  ? R kidney with 2.4x 2.4x 2.4cm complex cyst-->MR abd-->Bosniak 1 and 2, no worrisome renal lesion.  Stable on MRI abd 02/2018.  ? Retinal tear of right eye   ? Laser retinopexy 9/1 and 9/8, 2020.  ? ? ?Past Surgical History:  ?Procedure Laterality Date  ? Aortic ultrasound  02/13/13; 02/2015  ? no aneurism (screening for high risk)  ? BUNIONECTOMY  2006  ? bilat  ? carotid dopplers (screening for PAD)  11/16/2008  ? "minor left carotid dz" per old records.  No hx of TIA/CVA.  ? CESAREAN SECTION  x 2   ? [place]  ? COLONOSCOPY  07/2006  ? recall 10 yrs.  'tics.  (Dr. Domenica Fail in [place], [place])  ? CYST EXCISION    ? right index finger, base of fingernail  ? DEXA  08/2018  ? T-score -1.2.  Rpt 2 yrs.  ? kidney stone extraction  1992  ? Cystoscopy: fragmented ureteral stone.  No  procedures for this since then.  ? laser retinopexy Right 9/1 and 9/8,2020.  ? ? ?Outpatient Medications Prior to Visit  ?Medication Sig Dispense Refill  ? calcium citrate-vitamin D (CITRACAL+D) 315-200 MG-UNIT per tablet Take 1 tablet by mouth daily.    ? cetirizine (ZYRTEC) 10 MG tablet Take 10 mg by mouth daily.    ? Cholecalciferol (VITAMIN D) 2000 units tablet Take 2,000 Units by mouth daily.    ? fenofibrate 54 MG tablet Take 1 tablet (54 mg total) by mouth daily. 90 tablet 3  ? hydrochlorothiazide (MICROZIDE) 12.5 MG capsule TAKE 1 CAPSULE BY MOUTH EVERY DAY 90 capsule 3  ? Multiple Vitamins-Minerals (LUTEIN-ZEAXANTHIN PO) Take 125 mg by mouth daily.    ? Multiple Vitamins-Minerals (MULTIVITAMIN ADULTS PO) Take by mouth daily.    ? simvastatin (ZOCOR) 40 MG tablet Take 1 tablet (40 mg total) by mouth daily. 90 tablet 3  ? Turmeric 500 MG CAPS Take by mouth.    ? ?No facility-administered medications prior  to visit.  ? ? ?Allergies  ?Allergen Reactions  ? Metoclopramide Shortness Of Breath  ?  SOB, inc HR, nausea, fatigue  ? Erythromycin Other (See Comments)  ?  Abdominal pain  ? Tetracyclines & Related Other (See Comments)  ?  Joint pain  ? Lisinopril Cough  ? ? ?ROS ?As per HPI ? ?PE: ? ?  04/03/2021  ? 10:38 AM 11/20/2020  ?  9:27 AM 05/17/2020  ?  8:58 AM  ?Vitals with BMI  ?Height '5\' 5"'$  '5\' 5"'$  '5\' 5"'$   ?Weight 149 lbs 6 oz 149 lbs 13 oz 147 lbs 10 oz  ?BMI 24.86 24.93 24.56  ?Systolic 497 026 378  ?Diastolic 85 79 86  ?Pulse 68 63 80  ? ? ? ?Physical Exam ? ?General: Alert and well-appearing. ?Affect is pleasant, thought and speech are lucid. ?Right knee shows mild prepatellar swelling and feels warm.  No erythema.  Mild fluctuance over the patella. ?Mild tenderness to palpation over the patella, mostly along inferolateral aspect.  She can actively and passively extend and flex knee completely but has some significant discomfort with flexion past 120 degrees. ?No instability. ? ?LABS:  ?Last metabolic panel ?Lab Results  ?Component Value Date  ? GLUCOSE 73 11/20/2020  ? NA 139 11/20/2020  ? K 4.2 11/20/2020  ? CL 98 11/20/2020  ? CO2 34 (H) 11/20/2020  ? BUN 25 (H) 11/20/2020  ? CREATININE 0.99 11/20/2020  ? CALCIUM 10.2 11/20/2020  ? PROT 7.8 11/20/2020  ? ALBUMIN 4.9 11/20/2020  ? BILITOT 0.5 11/20/2020  ? ALKPHOS 40 11/20/2020  ? AST 24 11/20/2020  ? ALT 23 11/20/2020  ? ?IMPRESSION AND PLAN: ? ?Prepatellar bursitis right knee.  There is not a big enough pocket of fluid to aspirate. ? ?Bedside ultrasound today: Mild anechoic and hypo echoic change in prepatellar bursa region,  Subcu edema noted in this region as well, hyperemia on [place].  Mild edematous change in quad tendon near its insertion on the patella.  Tendon without fiber disruption. ?No fluid in suprapatellar pouch or medial or lateral patellar areas. ? ?Recommended ibuprofen 600 mg twice daily with food x5 to 7 days. ?Ice with compression 20 minutes 3  times a day. ?Relative rest. ? ?An After Visit Summary was printed and given to the patient. ? ?FOLLOW UP: Return in about 2 months (around 06/03/2021), or if symptoms worsen or fail to improve. ? ?Signed:  Crissie Sickles, MD  04/03/2021 ? ?

## 2021-04-03 NOTE — Patient Instructions (Signed)
Apply ice and wrap with ACE bandage to compress the swelling in the front of your knee > 20 min three times a day. ?Wear knee sleeve in between. ? ?Take THREE of the '200mg'$  ibuprofen tabs two times a day with food. ? ?

## 2021-04-26 ENCOUNTER — Ambulatory Visit
Admission: RE | Admit: 2021-04-26 | Discharge: 2021-04-26 | Disposition: A | Discharge status: Home / Self Care | Payer: Medicare HMO | Source: Ambulatory Visit | Attending: Family Medicine | Admitting: Family Medicine

## 2021-04-26 DIAGNOSIS — E2839 Other primary ovarian failure: Secondary | ICD-10-CM

## 2021-05-21 ENCOUNTER — Ambulatory Visit (INDEPENDENT_AMBULATORY_CARE_PROVIDER_SITE_OTHER): Payer: Medicare HMO | Admitting: Family Medicine

## 2021-05-21 ENCOUNTER — Other Ambulatory Visit: Payer: Self-pay | Admitting: Family Medicine

## 2021-05-21 ENCOUNTER — Encounter: Payer: Self-pay | Admitting: Family Medicine

## 2021-05-21 VITALS — BP 148/90 | HR 83 | Temp 98.4°F | Wt 150.4 lb

## 2021-05-21 DIAGNOSIS — I10 Essential (primary) hypertension: Secondary | ICD-10-CM

## 2021-05-21 DIAGNOSIS — E782 Mixed hyperlipidemia: Secondary | ICD-10-CM | POA: Diagnosis not present

## 2021-05-21 MED ORDER — HYDROCHLOROTHIAZIDE 12.5 MG PO CAPS: ORAL_CAPSULE | ORAL | 1 refills | Status: DC

## 2021-05-21 MED ORDER — FENOFIBRATE 54 MG PO TABS: 54.0000 mg | ORAL_TABLET | Freq: Every day | ORAL | 1 refills | Status: DC

## 2021-05-21 MED ORDER — SIMVASTATIN 40 MG PO TABS: 40.0000 mg | ORAL_TABLET | Freq: Every day | ORAL | 1 refills | Status: DC

## 2021-05-21 MED ORDER — SCOPOLAMINE 1 MG/3DAYS TD PT72: 1.0000 | MEDICATED_PATCH | TRANSDERMAL | 0 refills | Status: DC

## 2021-05-21 NOTE — Progress Notes (Signed)
OFFICE VISIT ? ?05/21/2021 ? ?CC:  ?Chief Complaint  ?Patient presents with  ? Follow-up  ?  6 mo follow up, pt concern about her bp running a little higher than normal. Current bp is 152/90  ? ? ?Patient is a 76 y.o. female who presents for 6 mo f/u HTN, HLD. ?A/P as of last visit: ?"1) hypertension.  Well-controlled on 12.5 mg HCTZ daily. ?Electrolytes and creatinine checked today. ? ?2.  Hyperlipidemia, mixed.  Tolerating fenofibrate 4 mg a day and Zocor 40 mg a day. ?Lipid and hepatic panel checked today. ?  ?3. Health maintenance exam: ?Reviewed age and gender appropriate health maintenance issues (prudent diet, regular exercise, health risks of tobacco and excessive alcohol, use of seatbelts, fire alarms in home, use of sunscreen).  Also reviewed age and gender appropriate health screening as well as vaccine recommendations. ?Vaccines: ALL UTD. ?Labs: CBC, CMET, FLP. ?Cervical ca screening: no further pap/pelvic indicated. ?Breast ca screening: Mammo NEG 09/2019--Ordered today. ?Colon ca screening: hx of normal colonoscopy 2008.  Cologuard neg 2018 and 2021.  Consider rpt cologuard 2024. ?Osteoporosis screening: pt has osteopenia (2020), next DEXA due-->ordered today" ? ?INTERIM HX: ?[place] feels well. ?I saw her for right knee prepatellar bursitis about 6 weeks ago. ?She feels like this is gradually getting better.  She occasionally takes an NSAID, typically ices her knee 1-2 times a day.  She can go up stairs now without problem, does not see it swell up any.  It occasionally feels warm anteriorly but no erythema. ? ?Last few months her bp has been into 130s/80s avg--- she checks it once daily every morning. ?Occ reading into 140s and 086V, diastolics never into the 78I. ?HR avg 75 or so. ?She is definitely apprehensive about her blood pressure, feels like she gets tense when she checks it at home. ? ?ROS as above, plus--> no fevers, no CP, no SOB, no wheezing, no cough, no dizziness, no HAs, no rashes, no  melena/hematochezia.  No polyuria or polydipsia.  No myalgias or arthralgias (other than R knee pain noted in HPI).  No focal weakness, paresthesias, or tremors.  No acute vision or hearing abnormalities.  No dysuria or unusual/new urinary urgency or frequency.  No recent changes in lower legs. ?No n/v/d or abd pain.  No palpitations.   ? ? ?Past Medical History:  ?Diagnosis Date  ? Acquired hammertoes of both feet   ? Anterior subluxation of right shoulder 07/2017  ? Relocated by Dr. Tamala Julian  ? Cervical disc disorder with radiculopathy    "  ? "       "      "  ? Chronic renal insufficiency, stage 3 (moderate) (HCC)   ? GFR 50s (avg sCr around 1.0)  ? Colon cancer screening   ? Colonoscopy normal 2008.  Cologuard NEG 06/2016.  Repeat 12/2019 NEG. Consider rpt 2024.  ? Diverticulosis   ? Duodenal diverticulum 11/2017  ? pancreatic cyst vs duodenal diverticulum partially obstructing ampulla??  Mild dilatation of CBD and main pancreatic duct.  Repeat MRI 02/2018 showed NORMAL pancreas and confirmed duodenal diverticulum.  CBD normal.  ? Family history of abdominal aortic aneurysm   ? pt has had AAA screening x 2, most recent was 02/2015.  ? Fracture of navicular bone of right wrist 2022  ? WFBU  ? Hepatic steatosis   ? mild (MRI abd 02/2018)  ? History of migraine   ? These resolved after menopause  ? HTN (hypertension)   ? +  hypertensive retinopathy  ? Hyperlipidemia, mixed   ? Insomnia   ? maintenance problems; trazodone no help  ? Intractable hiccups 10/2017  ? resolved with PPI and reglan  ? Left rotator cuff tear arthropathy 04/2015  ? Dr. Charlann Boxer  ? Nephrolithiasis 1994  ? Osteopenia 08/2018  ? DEXA 08/2018 T-score -1.2.  Rpt 04/2021 T score -0.9. Rpt 2-3 yrs.  ? Renal cysts, acquired, bilateral 11/12/2017  ? R kidney with 2.4x 2.4x 2.4cm complex cyst-->MR abd-->Bosniak 1 and 2, no worrisome renal lesion.  Stable on MRI abd 02/2018.  ? Retinal tear of right eye   ? Laser retinopexy 9/1 and 9/8, 2020.  ? ? ?Past  Surgical History:  ?Procedure Laterality Date  ? Aortic ultrasound  02/13/13; 02/2015  ? no aneurism (screening for high risk)  ? BUNIONECTOMY  2006  ? bilat  ? carotid dopplers (screening for PAD)  11/16/2008  ? "minor left carotid dz" per old records.  No hx of TIA/CVA.  ? CESAREAN SECTION  x 2   ? [place]  ? COLONOSCOPY  07/2006  ? recall 10 yrs.  'tics.  (Dr. Domenica Fail in [place], [place])  ? CYST EXCISION    ? right index finger, base of fingernail  ? DEXA  08/2018  ? 2020 T-score -1.2.  04/2021 T score -0.9.  Rpt 2-3 yrs  ? kidney stone extraction  1992  ? Cystoscopy: fragmented ureteral stone.  No procedures for this since then.  ? laser retinopexy Right 9/1 and 9/8,2020.  ? ? ?Outpatient Medications Prior to Visit  ?Medication Sig Dispense Refill  ? calcium citrate-vitamin D (CITRACAL+D) 315-200 MG-UNIT per tablet Take 1 tablet by mouth daily.    ? cetirizine (ZYRTEC) 10 MG tablet Take 10 mg by mouth daily.    ? Cholecalciferol (VITAMIN D) 2000 units tablet Take 2,000 Units by mouth daily.    ? fenofibrate 54 MG tablet Take 1 tablet (54 mg total) by mouth daily. 90 tablet 3  ? hydrochlorothiazide (MICROZIDE) 12.5 MG capsule TAKE 1 CAPSULE BY MOUTH EVERY DAY 90 capsule 3  ? Multiple Vitamins-Minerals (LUTEIN-ZEAXANTHIN PO) Take 125 mg by mouth daily.    ? Multiple Vitamins-Minerals (MULTIVITAMIN ADULTS PO) Take by mouth daily.    ? simvastatin (ZOCOR) 40 MG tablet Take 1 tablet (40 mg total) by mouth daily. 90 tablet 3  ? Turmeric 500 MG CAPS Take by mouth.    ? ?No facility-administered medications prior to visit.  ? ? ?Allergies  ?Allergen Reactions  ? Metoclopramide Shortness Of Breath  ?  SOB, inc HR, nausea, fatigue  ? Erythromycin Other (See Comments)  ?  Abdominal pain  ? Tetracyclines & Related Other (See Comments)  ?  Joint pain  ? Lisinopril Cough  ? ? ?ROS ?As per HPI ? ?PE: ? ?  05/21/2021  ?  9:37 AM 04/03/2021  ? 10:38 AM 11/20/2020  ?  9:27 AM  ?Vitals with BMI  ?Height  '5\' 5"'$  '5\' 5"'$   ?Weight 150 lbs 6  oz 149 lbs 6 oz 149 lbs 13 oz  ?BMI  24.86 24.93  ?Systolic 409 811 914  ?Diastolic 90 85 79  ?Pulse 83 68 63  ?Rpt bp manually today 148/90 ? ?Physical Exam ? ?Gen: Alert, well appearing.  Patient is oriented to person, place, time, and situation. ?AFFECT: pleasant, lucid thought and speech. ?No further exam today. ? ?LABS:  ?Last CBC ?Lab Results  ?Component Value Date  ? WBC 6.2 11/20/2020  ?  HGB 14.3 11/20/2020  ? HCT 42.4 11/20/2020  ? MCV 91.3 11/20/2020  ? MCH 30.8 11/17/2019  ? RDW 13.2 11/20/2020  ? PLT 298.0 11/20/2020  ? ?Last metabolic panel ?Lab Results  ?Component Value Date  ? GLUCOSE 73 11/20/2020  ? NA 139 11/20/2020  ? K 4.2 11/20/2020  ? CL 98 11/20/2020  ? CO2 34 (H) 11/20/2020  ? BUN 25 (H) 11/20/2020  ? CREATININE 0.99 11/20/2020  ? CALCIUM 10.2 11/20/2020  ? PROT 7.8 11/20/2020  ? ALBUMIN 4.9 11/20/2020  ? BILITOT 0.5 11/20/2020  ? ALKPHOS 40 11/20/2020  ? AST 24 11/20/2020  ? ALT 23 11/20/2020  ? ?Last lipids ?Lab Results  ?Component Value Date  ? CHOL 153 11/20/2020  ? HDL 48.10 11/20/2020  ? [place] 73 11/20/2020  ? TRIG 160.0 (H) 11/20/2020  ? CHOLHDL 3 11/20/2020  ? ?Last thyroid functions ?Lab Results  ?Component Value Date  ? TSH 2.32 10/26/2017  ? T3TOTAL 100.6 11/13/2014  ? ?IMPRESSION AND PLAN: ? ?#1 hypertension, not ideal control. ?Some anxiety surrounding actual blood pressure measurement is contributing. ?Discussed options of increasing HCTZ to 25 mg a day or just monitoring (check an afternoon or evening bp alt with morning bp qd). ?Prefers to hold off on medication change today. ? ?2.  Mixed hyperlipidemia. ?Has been doing well long-term on fenofibrate 54 mg daily and simvastatin 40 mg daily. ? ?#3 prepatellar bursitis, right knee. ?This is gradually improving. ? ?Plan repeat of metabolic panel and lipid panel at follow-up in 6 months. ? ?An After Visit Summary was printed and given to the patient. ? ?FOLLOW UP: No follow-ups on file. ? ?Signed:  Crissie Sickles, MD            05/21/2021 ? ?

## 2021-07-29 ENCOUNTER — Ambulatory Visit (INDEPENDENT_AMBULATORY_CARE_PROVIDER_SITE_OTHER): Payer: Medicare HMO | Admitting: Family Medicine

## 2021-07-29 ENCOUNTER — Encounter: Payer: Self-pay | Admitting: Family Medicine

## 2021-07-29 VITALS — BP 118/78 | HR 84 | Temp 97.4°F | Wt 146.8 lb

## 2021-07-29 DIAGNOSIS — J019 Acute sinusitis, unspecified: Secondary | ICD-10-CM

## 2021-07-29 DIAGNOSIS — J029 Acute pharyngitis, unspecified: Secondary | ICD-10-CM | POA: Diagnosis not present

## 2021-07-29 DIAGNOSIS — R059 Cough, unspecified: Secondary | ICD-10-CM | POA: Diagnosis not present

## 2021-07-29 LAB — POC COVID19 BINAXNOW: SARS Coronavirus 2 Ag: NEGATIVE

## 2021-07-29 LAB — POCT RAPID STREP A (OFFICE): Rapid Strep A Screen: NEGATIVE

## 2021-07-29 MED ORDER — AMOXICILLIN 875 MG PO TABS: 875.0000 mg | ORAL_TABLET | Freq: Two times a day (BID) | ORAL | 0 refills | Status: AC

## 2021-07-29 NOTE — Progress Notes (Signed)
OFFICE VISIT  07/29/2021  CC:  Chief Complaint  Patient presents with   Cough    Started about a week and a half   Patient is a 76 y.o. female who presents for cough.  HPI: Had about 10 days ago of nasal congestion, runny nose, postnasal drip, and rattly cough. Tmax 100. No shortness of breath.  She does feel quite fatigued.  Appetite is gone.  No nausea vomiting or diarrhea has been trying Mucinex DM.  Symptoms started not long after she got back from a trip/vacation.  Past Medical History:  Diagnosis Date   Acquired hammertoes of both feet    Anterior subluxation of right shoulder 07/2017   Relocated by Dr. Tamala Julian   Cervical disc disorder with radiculopathy    "   "       "      "   Chronic renal insufficiency, stage 3 (moderate) (HCC)    GFR 50s (avg sCr around 1.0)   Colon cancer screening    Colonoscopy normal 2008.  Cologuard NEG 06/2016.  Repeat 12/2019 NEG. Consider rpt 2024.   Diverticulosis    Duodenal diverticulum 11/2017   pancreatic cyst vs duodenal diverticulum partially obstructing ampulla??  Mild dilatation of CBD and main pancreatic duct.  Repeat MRI 02/2018 showed NORMAL pancreas and confirmed duodenal diverticulum.  CBD normal.   Family history of abdominal aortic aneurysm    pt has had AAA screening x 2, most recent was 02/2015.   Fracture of navicular bone of right wrist 2022   [facility]   Hepatic steatosis    mild (MRI abd 02/2018)   History of migraine    These resolved after menopause   HTN (hypertension)    + hypertensive retinopathy   Hyperlipidemia, mixed    Insomnia    maintenance problems; trazodone no help   Intractable hiccups 10/2017   resolved with PPI and reglan   Left rotator cuff tear arthropathy 04/2015   Dr. Charlann Boxer   Nephrolithiasis 1994   Osteopenia 08/2018   DEXA 08/2018 T-score -1.2.  Rpt 04/2021 T score -0.9. Rpt 2-3 yrs.   Renal cysts, acquired, bilateral 11/12/2017   R kidney with 2.4x 2.4x 2.4cm complex cyst-->MR abd-->Bosniak  1 and 2, no worrisome renal lesion.  Stable on MRI abd 02/2018.   Retinal tear of right eye    Laser retinopexy 9/1 and 9/8, 2020.    Past Surgical History:  Procedure Laterality Date   Aortic ultrasound  02/13/13; 02/2015   no aneurism (screening for high risk)   BUNIONECTOMY  2006   bilat   carotid dopplers (screening for PAD)  11/16/2008   "minor left carotid dz" per old records.  No hx of TIA/CVA.   CESAREAN SECTION  x 2    [place]  07/2006   recall 10 yrs.  'tics.  (Dr. Domenica Fail in [place], [place])   [place]     right index finger, base of fingernail   DEXA  08/2018   2020 T-score -1.2.  04/2021 T score -0.9.  Rpt 2-3 yrs   kidney stone extraction  1992   Cystoscopy: fragmented ureteral stone.  No procedures for this since then.   laser retinopexy Right 9/1 and 9/8,2020.    Outpatient Medications Prior to Visit  Medication Sig Dispense Refill   calcium citrate-vitamin D (CITRACAL+D) 315-200 MG-UNIT per tablet Take 1 tablet by mouth daily.     cetirizine (ZYRTEC) 10 MG tablet Take 10  mg by mouth daily.     Cholecalciferol (VITAMIN D) 2000 units tablet Take 2,000 Units by mouth daily.     fenofibrate 54 MG tablet Take 1 tablet (54 mg total) by mouth daily. 90 tablet 1   hydrochlorothiazide (MICROZIDE) 12.5 MG capsule TAKE 1 CAPSULE BY MOUTH EVERY DAY 90 capsule 1   Multiple Vitamins-Minerals (LUTEIN-ZEAXANTHIN PO) Take 125 mg by mouth daily.     Multiple Vitamins-Minerals (MULTIVITAMIN ADULTS PO) Take by mouth daily.     scopolamine (TRANSDERM-SCOP) 1 MG/3DAYS Place 1 patch (1.5 mg total) onto the skin every 3 (three) days. 4 patch 0   simvastatin (ZOCOR) 40 MG tablet Take 1 tablet (40 mg total) by mouth daily. 90 tablet 1   Turmeric 500 MG CAPS Take by mouth.     No facility-administered medications prior to visit.    Allergies  Allergen Reactions   Metoclopramide Shortness Of Breath    SOB, inc HR, nausea, fatigue   Erythromycin Other (See Comments)     Abdominal pain   Tetracyclines & Related Other (See Comments)    Joint pain   Lisinopril Cough    ROS As per HPI  PE:    07/29/2021   10:26 AM 05/21/2021   10:06 AM 05/21/2021    9:37 AM  Vitals with BMI  Weight 146 lbs 13 oz  528 lbs 6 oz  Systolic 413 244 010  Diastolic 78 90 90  Pulse 84  83   Physical Exam  VS: noted--normal. Gen: alert, NAD, NONTOXIC APPEARING. HEENT: eyes without injection, drainage, or swelling.  Ears: EACs clear, TMs with normal light reflex and landmarks.  Nose: Clear rhinorrhea, with some dried, crusty exudate adherent to mildly injected mucosa.  No purulent d/c.  Mild left greater than right paranasal sinus tenderness to palpation.  No facial swelling.  Throat and mouth without focal lesion.  No pharyngial swelling, erythema, or exudate.   Neck: supple, no LAD.   LUNGS: CTA bilat, nonlabored resps.   CV: RRR, no m/r/g. EXT: no c/c/e SKIN: no rash   LABS:  Last CBC Lab Results  Component Value Date   WBC 6.2 11/20/2020   HGB 14.3 11/20/2020   HCT 42.4 11/20/2020   MCV 91.3 11/20/2020   MCH 30.8 11/17/2019   RDW 13.2 11/20/2020   PLT 298.0 27/25/3664   Last metabolic panel Lab Results  Component Value Date   GLUCOSE 73 11/20/2020   NA 139 11/20/2020   K 4.2 11/20/2020   CL 98 11/20/2020   CO2 34 (H) 11/20/2020   BUN 25 (H) 11/20/2020   CREATININE 0.99 11/20/2020   CALCIUM 10.2 11/20/2020   PROT 7.8 11/20/2020   ALBUMIN 4.9 11/20/2020   BILITOT 0.5 11/20/2020   ALKPHOS 40 11/20/2020   AST 24 11/20/2020   ALT 23 11/20/2020   Rapid strep today negative. Rapid COVID today negative.  IMPRESSION AND PLAN:  Prolonged URI with cough and congestion. We will treat for acute sinusitis with amoxicillin 875 twice daily x10 days. Continue Mucinex DM every 12 as needed.  An After Visit Summary was printed and given to the patient.  FOLLOW UP: Return if symptoms worsen or fail to improve.  Signed:  Crissie Sickles, MD            07/29/2021

## 2021-11-09 ENCOUNTER — Other Ambulatory Visit: Payer: Self-pay | Admitting: Family Medicine

## 2021-11-11 NOTE — Telephone Encounter (Signed)
Pt has upcoming appt on 11/16 for CPE.

## 2021-11-13 ENCOUNTER — Other Ambulatory Visit: Payer: Self-pay | Admitting: Family Medicine

## 2021-11-20 NOTE — Patient Instructions (Signed)

## 2021-11-21 ENCOUNTER — Ambulatory Visit (INDEPENDENT_AMBULATORY_CARE_PROVIDER_SITE_OTHER): Payer: Medicare HMO | Admitting: Family Medicine

## 2021-11-21 ENCOUNTER — Encounter: Payer: Self-pay | Admitting: Family Medicine

## 2021-11-21 VITALS — BP 125/79 | HR 68 | Temp 97.9°F | Ht 65.0 in | Wt 144.6 lb

## 2021-11-21 DIAGNOSIS — E7800 Pure hypercholesterolemia, unspecified: Secondary | ICD-10-CM | POA: Diagnosis not present

## 2021-11-21 DIAGNOSIS — I10 Essential (primary) hypertension: Secondary | ICD-10-CM | POA: Diagnosis not present

## 2021-11-21 DIAGNOSIS — Z0000 Encounter for general adult medical examination without abnormal findings: Secondary | ICD-10-CM | POA: Diagnosis not present

## 2021-11-21 NOTE — Progress Notes (Signed)
Office Note 11/21/2021  CC:  Chief Complaint  Patient presents with   Annual Exam    HPI:  Patient is a 76 y.o. female who is here for annual health maintenance exam and 24-monthfollow-up hypertension and hyperlipidemia.  Karen Bell feeling well.  She walks daily for exercise. Home bp's: avg 130/80, HR 60s-70s  Past Medical History:  Diagnosis Date   Acquired hammertoes of both feet    Anterior subluxation of right shoulder 07/2017   Relocated by Dr. STamala Julian  Cervical disc disorder with radiculopathy    "   "       "      "   Chronic renal insufficiency, stage 3 (moderate) (HCC)    GFR 50s (avg sCr around 1.0)   Colon cancer screening    Colonoscopy normal 2008.  Cologuard NEG 06/2016.  Repeat 12/2019 NEG. Consider rpt 2024.   Diverticulosis    Duodenal diverticulum 11/2017   pancreatic cyst vs duodenal diverticulum partially obstructing ampulla??  Mild dilatation of CBD and main pancreatic duct.  Repeat MRI 02/2018 showed NORMAL pancreas and confirmed duodenal diverticulum.  CBD normal.   Family history of abdominal aortic aneurysm    pt has had AAA screening x 2, most recent was 02/2015.   Fracture of navicular bone of right wrist 2022   [facility]   Hepatic steatosis    mild (MRI abd 02/2018)   History of migraine    These resolved after menopause   HTN (hypertension)    + hypertensive retinopathy   Hyperlipidemia, mixed    Insomnia    maintenance problems; trazodone no help   Intractable hiccups 10/2017   resolved with PPI and reglan   Left rotator cuff tear arthropathy 04/2015   Dr. ZCharlann Boxer  Nephrolithiasis 1994   Osteopenia 08/2018   DEXA 08/2018 T-score -1.2.  Rpt 04/2021 T score -0.9. Rpt 2-3 yrs.   Renal cysts, acquired, bilateral 11/12/2017   R kidney with 2.4x 2.4x 2.4cm complex cyst-->MR abd-->Bosniak 1 and 2, no worrisome renal lesion.  Stable on MRI abd 02/2018.   Retinal tear of right eye    Laser retinopexy 9/1 and 9/8, 2020.    Past Surgical History:   Procedure Laterality Date   Aortic ultrasound  02/13/13; 02/2015   no aneurism (screening for high risk)   BUNIONECTOMY  2006   bilat   carotid dopplers (screening for PAD)  11/16/2008   "minor left carotid dz" per old records.  No hx of TIA/CVA.   CESAREAN SECTION  x 2    [place] 07/2006   recall 10 yrs.  'tics.  [place]    right index finger, base of fingernail   DEXA  08/2018   2020 T-score -1.2.  04/2021 T score -0.9.  Rpt 2-3 yrs   kidney stone extraction  1992   Cystoscopy: fragmented ureteral stone.  No procedures for this since then.   laser retinopexy Right 9/1 and 9/8,2020.    Family History  Problem Relation Age of Onset   Cancer Mother        ? ovarian vs endometrial   Hyperlipidemia Father    Hypertension Father    Bladder Cancer Brother    AAA (abdominal aortic aneurysm) Brother    Colon cancer Other        Negative history.   Diabetes Other    Kidney disease Brother  AAA (abdominal aortic aneurysm) Brother    Dementia Brother    Heart disease Brother    AAA (abdominal aortic aneurysm) Brother    Diabetes Brother    Hypertension Brother     Social History   Socioeconomic History   Marital status: Married    Spouse name: Not on file   Number of children: Not on file   Years of education: Not on file   Highest education level: 12th grade  Occupational History   Not on file  Tobacco Use   Smoking status: Never   Smokeless tobacco: Never  Vaping Use   Vaping Use: Never used  Substance and Sexual Activity   Alcohol use: Yes    Alcohol/week: 0.0 standard drinks of alcohol    Comment: rarely   Drug use: Never   Sexual activity: Not on file  Other Topics Concern   Not on file  Social History Narrative   Married, 2 daughters.  Four older brothers, 3 of which have had abdominal aneurisms.   Relocated from [place] area 05/2013.   Occupation: retired Research officer, political party.   No tob, occ alcohol.      Social Determinants of Health   Financial Resource Strain: Low Risk  (11/21/2020)   Overall Financial Resource Strain (CARDIA)    Difficulty of Paying Living Expenses: Not hard at all  Food Insecurity: No Food Insecurity (05/20/2021)   Hunger Vital Sign    Worried About Running Out of Food in the Last Year: Never true    Ran Out of Food in the Last Year: Never true  Transportation Needs: No Transportation Needs (05/20/2021)   PRAPARE - Hydrologist (Medical): No    Lack of Transportation (Non-Medical): No  Physical Activity: Sufficiently Active (05/20/2021)   Exercise Vital Sign    Days of Exercise per Week: 5 days    Minutes of Exercise per Session: 30 min  Stress: No Stress Concern Present (05/20/2021)   [place]    Feeling of Stress : Only a little  Social Connections: Unknown (05/20/2021)   Social Connection and Isolation Panel [NHANES]    Frequency of Communication with Friends and Family: More than three times a week    Frequency of Social Gatherings with Friends and Family: Once a week    Attends Religious Services: Patient refused    Marine scientist or Organizations: No    Attends Archivist Meetings: Never    Marital Status: Married  Human resources officer Violence: Not At Risk (11/21/2020)   Humiliation, Afraid, Rape, and Kick questionnaire    Fear of Current or Ex-Partner: No    Emotionally Abused: No    Physically Abused: No    Sexually Abused: No    Outpatient Medications Prior to Visit  Medication Sig Dispense Refill   calcium citrate-vitamin D (CITRACAL+D) 315-200 MG-UNIT per tablet Take 1 tablet by mouth daily.     Cholecalciferol (VITAMIN D) 2000 units tablet Take 2,000 Units by mouth daily.     fenofibrate 54 MG tablet Take 1 tablet (54 mg total) by mouth daily. 90 tablet 1   hydrochlorothiazide (MICROZIDE) 12.5 MG capsule TAKE 1 CAPSULE BY MOUTH EVERY  DAY 90 capsule 0   Multiple Vitamins-Minerals (LUTEIN-ZEAXANTHIN PO) Take 125 mg by mouth daily.     Multiple Vitamins-Minerals (MULTIVITAMIN ADULTS PO) Take by mouth daily.     simvastatin (ZOCOR) 40 MG tablet TAKE 1 TABLET BY MOUTH  EVERY DAY 90 tablet 0   Turmeric 500 MG CAPS Take by mouth.     cetirizine (ZYRTEC) 10 MG tablet Take 10 mg by mouth daily.     scopolamine (TRANSDERM-SCOP) 1 MG/3DAYS Place 1 patch (1.5 mg total) onto the skin every 3 (three) days. 4 patch 0   No facility-administered medications prior to visit.    Allergies  Allergen Reactions   Metoclopramide Shortness Of Breath    SOB, inc HR, nausea, fatigue   Erythromycin Other (See Comments)    Abdominal pain   Tetracyclines & Related Other (See Comments)    Joint pain   Lisinopril Cough    ROS Review of Systems  Constitutional:  Negative for appetite change, chills, fatigue and fever.  HENT:  Negative for congestion, dental problem, ear pain and sore throat.   Eyes:  Negative for discharge, redness and visual disturbance.  Respiratory:  Negative for cough, chest tightness, shortness of breath and wheezing.   Cardiovascular:  Negative for chest pain, palpitations and leg swelling.  Gastrointestinal:  Negative for abdominal pain, blood in stool, diarrhea, nausea and vomiting.  Genitourinary:  Negative for difficulty urinating, dysuria, flank pain, frequency, hematuria and urgency.  Musculoskeletal:  Negative for arthralgias, back pain, joint swelling, myalgias and neck stiffness.  Skin:  Negative for pallor and rash.  Neurological:  Negative for dizziness, speech difficulty, weakness and headaches.  Hematological:  Negative for adenopathy. Does not bruise/bleed easily.  Psychiatric/Behavioral:  Negative for confusion and sleep disturbance. The patient is not nervous/anxious.     PE;    11/21/2021    8:28 AM 07/29/2021   10:26 AM 05/21/2021   10:06 AM  Vitals with BMI  Height '5\' 5"'$     Weight 144 lbs 10 oz  146 lbs 13 oz   BMI 16.10    Systolic 960 454 098  Diastolic 79 78 90  Pulse 68 84    Exam chaperoned by Deveron Furlong, CMA.   Gen: Alert, well appearing.  Patient is oriented to person, place, time, and situation. AFFECT: pleasant, lucid thought and speech. ENT: Ears: EACs clear, normal epithelium.  TMs with good light reflex and landmarks bilaterally.  Eyes: no injection, icteris, swelling, or exudate.  EOMI, PERRLA. Nose: no drainage or turbinate edema/swelling.  No injection or focal lesion.  Mouth: lips without lesion/swelling.  Oral mucosa pink and moist.  Dentition intact and without obvious caries or gingival swelling.  Oropharynx without erythema, exudate, or swelling.  Neck: supple/nontender.  No LAD, mass, or TM.  Carotid pulses 2+ bilaterally, without bruits. CV: RRR, no m/r/g.   LUNGS: CTA bilat, nonlabored resps, good aeration in all lung fields. ABD: soft, NT, ND, BS normal.  No hepatospenomegaly or mass.  No bruits. EXT: no clubbing, cyanosis, or edema.  Musculoskeletal: no joint swelling, erythema, warmth, or tenderness.  ROM of all joints intact. Skin - no sores or suspicious lesions or rashes or color changes  Pertinent labs:  Lab Results  Component Value Date   TSH 2.32 10/26/2017   Lab Results  Component Value Date   WBC 6.2 11/20/2020   HGB 14.3 11/20/2020   HCT 42.4 11/20/2020   MCV 91.3 11/20/2020   PLT 298.0 11/20/2020   Lab Results  Component Value Date   CREATININE 0.99 11/20/2020   BUN 25 (H) 11/20/2020   NA 139 11/20/2020   K 4.2 11/20/2020   CL 98 11/20/2020   CO2 34 (H) 11/20/2020   Lab Results  Component Value Date  ALT 23 11/20/2020   AST 24 11/20/2020   ALKPHOS 40 11/20/2020   BILITOT 0.5 11/20/2020   Lab Results  Component Value Date   CHOL 153 11/20/2020   Lab Results  Component Value Date   HDL 48.10 11/20/2020   Lab Results  Component Value Date   LDLCALC 73 11/20/2020   Lab Results  Component Value Date   TRIG  160.0 (H) 11/20/2020   Lab Results  Component Value Date   CHOLHDL 3 11/20/2020   ASSESSMENT AND PLAN:   #1 health maintenance exam: Reviewed age and gender appropriate health maintenance issues (prudent diet, regular exercise, health risks of tobacco and excessive alcohol, use of seatbelts, fire alarms in home, use of sunscreen).  Also reviewed age and gender appropriate health screening as well as vaccine recommendations. Vaccines: ALL UTD. Labs: CBC, CMET, FLP. Cervical ca screening: no further pap/pelvic indicated. Breast ca screening: Mammo due 01/2022 Colon ca screening: hx of normal colonoscopy 2008.  Cologuard neg 2018 and 2021.  Consider rpt cologuard 2024. Osteoporosis screening: next DEXA 2025  #2 hypertension, well controlled on 12.5 mg HCTZ daily. Electrolytes and creatinine today.  2.  Mixed hyperlipidemia. Doing well on simvastatin 40 mg a day and fenofibrate 54 mg a day. Lipid panel and hepatic panel today.  An After Visit Summary was printed and given to the patient.  FOLLOW UP:  Return in about 6 months (around 05/22/2022) for routine chronic illness f/u.  Signed:  Crissie Sickles, MD           11/21/2021

## 2021-11-22 LAB — CBC
HCT: 41.5 % (ref 35.0–45.0)
Hemoglobin: 14.1 g/dL (ref 11.7–15.5)
MCH: 30 pg (ref 27.0–33.0)
MCHC: 34 g/dL (ref 32.0–36.0)
MCV: 88.3 fL (ref 80.0–100.0)
MPV: 9.4 fL (ref 7.5–12.5)
Platelets: 278 10*3/uL (ref 140–400)
RBC: 4.7 10*6/uL (ref 3.80–5.10)
RDW: 12.7 % (ref 11.0–15.0)
WBC: 6.5 10*3/uL (ref 3.8–10.8)

## 2021-11-22 LAB — COMPREHENSIVE METABOLIC PANEL
AG Ratio: 1.5 (calc) (ref 1.0–2.5)
ALT: 20 U/L (ref 6–29)
AST: 20 U/L (ref 10–35)
Albumin: 4.4 g/dL (ref 3.6–5.1)
Alkaline phosphatase (APISO): 38 U/L (ref 37–153)
BUN/Creatinine Ratio: 24 (calc) — ABNORMAL HIGH (ref 6–22)
BUN: 24 mg/dL (ref 7–25)
CO2: 32 mmol/L (ref 20–32)
Calcium: 10.1 mg/dL (ref 8.6–10.4)
Chloride: 102 mmol/L (ref 98–110)
Creat: 1.01 mg/dL — ABNORMAL HIGH (ref 0.60–1.00)
Globulin: 2.9 g/dL (calc) (ref 1.9–3.7)
Glucose, Bld: 79 mg/dL (ref 65–99)
Potassium: 4.5 mmol/L (ref 3.5–5.3)
Sodium: 141 mmol/L (ref 135–146)
Total Bilirubin: 0.4 mg/dL (ref 0.2–1.2)
Total Protein: 7.3 g/dL (ref 6.1–8.1)

## 2021-11-22 LAB — LIPID PANEL
Cholesterol: 148 mg/dL (ref <200)
HDL: 46 mg/dL — ABNORMAL LOW (ref >=50)
LDL Cholesterol (Calc): 76 mg/dL (calc)
Non-HDL Cholesterol (Calc): 102 mg/dL (calc) (ref <130)
Total CHOL/HDL Ratio: 3.2 (calc) (ref <5.0)
Triglycerides: 154 mg/dL — ABNORMAL HIGH (ref <150)

## 2021-12-04 ENCOUNTER — Ambulatory Visit: Payer: Medicare HMO

## 2021-12-11 ENCOUNTER — Ambulatory Visit (INDEPENDENT_AMBULATORY_CARE_PROVIDER_SITE_OTHER): Payer: Medicare HMO

## 2021-12-11 DIAGNOSIS — Z0000 Encounter for general adult medical examination without abnormal findings: Secondary | ICD-10-CM | POA: Diagnosis not present

## 2021-12-11 NOTE — Patient Instructions (Signed)

## 2021-12-11 NOTE — Progress Notes (Signed)
Subjective:   Karen Bell is a 76 y.o. female who presents for Medicare Annual (Subsequent) preventive examination.  I connected with  Karen Bell on 12/11/21 by an audio only telemedicine application and verified that I am speaking with the correct person using two identifiers.   I discussed the limitations, risks, security and privacy concerns of performing an evaluation and management service by telephone and the availability of in person appointments. I also discussed with the patient that there may be a patient responsible charge related to this service. The patient expressed understanding and verbally consented to this telephonic visit.  Location of Patient: home Location of Provider: office  List any persons and their role that are participating in the visit with the patient.   [place], CMA Review of Systems    Defer to PCP Cardiac Risk Factors include: advanced age (>28mn, >>24women);hypertension;dyslipidemia     Objective:    There were no vitals filed for this visit. There is no height or weight on file to calculate BMI.     12/11/2021    9:19 AM 11/21/2020   10:22 AM 04/23/2020    7:56 PM 10/26/2019    9:43 AM 12/14/2017    9:24 AM  Advanced Directives  Does Patient Have a Medical Advance Directive? Yes Yes No;Yes Yes Yes  Type of AParamedicof [place]ortlandLiving will HP[place] GroveLiving will H[place] FallsLiving will H[place]Living will Living will;Healthcare Power of Attorney  Does patient want to make changes to medical advance directive? No - Patient declined      Copy of HOrasonin Chart? No - copy requested No - copy requested  Yes - validated most recent copy scanned in chart (See row information) No - copy requested    Current Medications (verified) Outpatient Encounter Medications as of 12/11/2021  Medication Sig   calcium  citrate-vitamin D (CITRACAL+D) 315-200 MG-UNIT per tablet Take 1 tablet by mouth daily.   Cholecalciferol (VITAMIN D) 2000 units tablet Take 2,000 Units by mouth daily.   fenofibrate 54 MG tablet Take 1 tablet (54 mg total) by mouth daily.   hydrochlorothiazide (MICROZIDE) 12.5 MG capsule TAKE 1 CAPSULE BY MOUTH EVERY DAY   Multiple Vitamins-Minerals (LUTEIN-ZEAXANTHIN PO) Take 125 mg by mouth daily.   Multiple Vitamins-Minerals (MULTIVITAMIN ADULTS PO) Take by mouth daily.   simvastatin (ZOCOR) 40 MG tablet TAKE 1 TABLET BY MOUTH EVERY DAY   Turmeric 500 MG CAPS Take by mouth.   No facility-administered encounter medications on file as of 12/11/2021.    Allergies (verified) Metoclopramide, Erythromycin, Tetracyclines & related, and Lisinopril   History: Past Medical History:  Diagnosis Date   Acquired hammertoes of both feet    Anterior subluxation of right shoulder 07/2017   Relocated by Dr. STamala Julian  Cervical disc disorder with radiculopathy    "   "       "      "   Chronic renal insufficiency, stage 3 (moderate) (HCC)    GFR 50s (avg sCr around 1.0)   Colon cancer screening    Colonoscopy normal 2008.  Cologuard NEG 06/2016.  Repeat 12/2019 NEG. Consider rpt 2024.   Diverticulosis    Duodenal diverticulum 11/2017   pancreatic cyst vs duodenal diverticulum partially obstructing ampulla??  Mild dilatation of CBD and main pancreatic duct.  Repeat MRI 02/2018 showed NORMAL pancreas and confirmed duodenal diverticulum.  CBD normal.  Family history of abdominal aortic aneurysm    pt has had AAA screening x 2, most recent was 02/2015.   Fracture of navicular bone of right wrist 2022   [facility]   Hepatic steatosis    mild (MRI abd 02/2018)   History of migraine    These resolved after menopause   HTN (hypertension)    + hypertensive retinopathy   Hyperlipidemia, mixed    Insomnia    maintenance problems; trazodone no help   Intractable hiccups 10/2017   resolved with PPI and reglan    Left rotator cuff tear arthropathy 04/2015   Dr. Charlann Boxer   Nephrolithiasis 1994   Osteopenia 08/2018   DEXA 08/2018 T-score -1.2.  Rpt 04/2021 T score -0.9. Rpt 2-3 yrs.   Renal cysts, acquired, bilateral 11/12/2017   R kidney with 2.4x 2.4x 2.4cm complex cyst-->MR abd-->Bosniak 1 and 2, no worrisome renal lesion.  Stable on MRI abd 02/2018.   Retinal tear of right eye    Laser retinopexy 9/1 and 9/8, 2020.   Past Surgical History:  Procedure Laterality Date   Aortic ultrasound  02/13/13; 02/2015   no aneurism (screening for high risk)   BUNIONECTOMY  2006   bilat   carotid dopplers (screening for PAD)  11/16/2008   "minor left carotid dz" per old records.  No hx of TIA/CVA.   CESAREAN SECTION  x 2    [place]  07/2006   recall 10 yrs.  'tics.  (Dr. Domenica Fail in [place], [place])   [place]     right index finger, base of fingernail   DEXA  08/2018   2020 T-score -1.2.  04/2021 T score -0.9.  Rpt 2-3 yrs   kidney stone extraction  1992   Cystoscopy: fragmented ureteral stone.  No procedures for this since then.   laser retinopexy Right 9/1 and 9/8,2020.   Family History  Problem Relation Age of Onset   Cancer Mother        ? ovarian vs endometrial   Hyperlipidemia Father    Hypertension Father    Bladder Cancer Brother    AAA (abdominal aortic aneurysm) Brother    Colon cancer Other        Negative history.   Diabetes Other    Kidney disease Brother    AAA (abdominal aortic aneurysm) Brother    Dementia Brother    Heart disease Brother    AAA (abdominal aortic aneurysm) Brother    Diabetes Brother    Hypertension Brother    Social History   Socioeconomic History   Marital status: Married    Spouse name: Not on file   Number of children: Not on file   Years of education: Not on file   Highest education level: 12th grade  Occupational History   Not on file  Tobacco Use   Smoking status: Never   Smokeless tobacco: Never  Vaping Use   Vaping Use:  Never used  Substance and Sexual Activity   Alcohol use: Yes    Alcohol/week: 0.0 standard drinks of alcohol    Comment: rarely   Drug use: Never   Sexual activity: Not on file  Other Topics Concern   Not on file  Social History Narrative   Married, 2 daughters.  Four older brothers, 3 of which have had abdominal aneurisms.   Relocated from [place] area 05/2013.   Occupation: retired Research officer, political party.   No tob, occ alcohol.  Social Determinants of Health   Financial Resource Strain: Bell Risk  (12/11/2021)   Overall Financial Resource Strain (CARDIA)    Difficulty of Paying Living Expenses: Not hard at all  Food Insecurity: No Food Insecurity (12/11/2021)   Hunger Vital Sign    Worried About Running Out of Food in the Last Year: Never true    Ran Out of Food in the Last Year: Never true  Transportation Needs: No Transportation Needs (12/11/2021)   PRAPARE - Hydrologist (Medical): No    Lack of Transportation (Non-Medical): No  Physical Activity: Insufficiently Active (12/11/2021)   Exercise Vital Sign    Days of Exercise per Week: 4 days    Minutes of Exercise per Session: 30 min  Stress: No Stress Concern Present (12/11/2021)   [place]    Feeling of Stress : Only a little  Social Connections: Unknown (12/11/2021)   Social Connection and Isolation Panel [NHANES]    Frequency of Communication with Friends and Family: More than three times a week    Frequency of Social Gatherings with Friends and Family: Once a week    Attends Religious Services: Patient refused    Marine scientist or Organizations: No    Attends Music therapist: Never    Marital Status: Married    Tobacco Counseling Counseling given: Not Answered   Clinical Intake:     Pain : No/denies pain     Nutritional Risks: None Diabetes: No  How often do you need to have someone help you  when you read instructions, pamphlets, or other written materials from your doctor or pharmacy?: 2 - Rarely What is the last grade level you completed in school?: 12  Diabetic?no         Activities of Daily Living    12/11/2021    9:21 AM  In your present state of health, do you have any difficulty performing the following activities:  Hearing? 1  Vision? 1  Difficulty concentrating or making decisions? 0  Walking or climbing stairs? 0  Dressing or bathing? 0  Doing errands, shopping? 0  Preparing Food and eating ? N  Using the Toilet? N  In the past six months, have you accidently leaked urine? Y  Do you have problems with loss of bowel control? N  Managing your Medications? N  Managing your Finances? N  Housekeeping or managing your Housekeeping? N    Patient Care Team: Tammi Sou, MD as PCP - General (Family Medicine) Harriett Sine, MD as Consulting Physician (Dermatology) Azucena Fallen, MD as Consulting Physician (Obstetrics and Gynecology) Lyndal Pulley, DO as Consulting Physician (Sports Medicine) Paulla Dolly Tamala Fothergill, DPM as Consulting Physician (Podiatry) Otelia Sergeant, OD as Referring Physician Lucilla Lame (Dentistry) Princess Bruins, MD as Consulting Physician (Ophthalmology)  Indicate any recent Medical Services you may have received from other than [facility] providers in the past year (date may be approximate).     Assessment:   This is a routine wellness examination for Karen Bell.  Hearing/Vision screen No results found.  Dietary issues and exercise activities discussed: Current Exercise Habits: Home exercise routine, Type of exercise: walking, Time (Minutes): 30, Frequency (Times/Week): 4, Weekly Exercise (Minutes/Week): 120   Goals Addressed   None   Depression Screen    12/11/2021    9:26 AM 11/21/2021    8:31 AM 04/03/2021   11:04 AM 11/21/2020   10:21 AM 11/20/2020  9:27 AM 10/26/2019    9:46 AM 06/22/2018    9:27 AM  PHQ 2/9 Scores   PHQ - 2 Score 0 0 0 0 0 1 1    Fall Risk    12/11/2021    9:26 AM 11/21/2021    8:31 AM 05/21/2021    9:38 AM 05/20/2021    7:15 PM 04/03/2021   11:03 AM  Fall Risk   Falls in the past year? 1 1 0 0 1  Number falls in past yr: 0 0 0  0  Injury with Fall? 0 0 0  1  Risk for fall due to : History of fall(s) Impaired vision     Follow up Falls evaluation completed Falls evaluation completed   Falls evaluation completed    [place]:  Any stairs in or around the home? Yes  If so, are there any without handrails? Yes  Home free of loose throw rugs in walkways, pet beds, electrical cords, etc? Yes  Adequate lighting in your home to reduce risk of falls? Yes   ASSISTIVE DEVICES UTILIZED TO PREVENT FALLS:  Life alert? No  Use of a cane, walker or w/c? No  Grab bars in the bathroom? No  Shower chair or bench in shower? No  Elevated toilet seat or a handicapped toilet? No   TIMED UP AND GO:  Was the test performed? No .  Length of time to ambulate 10 feet: n/a sec.     Cognitive Function:    12/14/2017    9:27 AM  MMSE - Mini Mental State Exam  Orientation to time 5  Orientation to Place 5  Registration 3  Attention/ Calculation 5  Recall 3  Language- name 2 objects 2  Language- repeat 1  Language- follow 3 step command 3  Language- read & follow direction 1  Write a sentence 1  Copy design 1  Total score 30        12/11/2021    9:22 AM 11/21/2020   10:27 AM  6CIT Screen  What Year? 0 points 0 points  What month? 0 points 0 points  What time? 0 points 0 points  Count back from 20 0 points 0 points  Months in reverse 0 points 0 points  Repeat phrase 0 points 0 points  Total Score 0 points 0 points    Immunizations Immunization History  Administered Date(s) Administered   Fluad Quad(high Dose 65+) 10/31/2020, 11/15/2021   Influenza, High Dose Seasonal PF 10/09/2016, 09/19/2017, 11/03/2019   Influenza-Unspecified  09/24/2015   PFIZER(Purple Top)SARS-COV-2 Vaccination 01/18/2019, 02/15/2019   Pneumococcal Conjugate-13 11/09/2013   Pneumococcal Polysaccharide-23 06/15/2015   Tdap 05/21/2019, 04/21/2020   Zoster Recombinat (Shingrix) 01/31/2018, 07/08/2018    TDAP status: Up to date  Flu Vaccine status: Up to date  Pneumococcal vaccine status: Up to date  Covid-19 vaccine status: Completed vaccines  Qualifies for Shingles Vaccine? Yes   Zostavax completed No   Shingrix Completed?: Yes  Screening Tests Health Maintenance  Topic Date Due   COVID-19 Vaccine (3 - 2023-24 season) 09/06/2021   Medicare Annual Wellness (AWV)  12/12/2022   DTaP/Tdap/Td (3 - Td or Tdap) 04/22/2030   Pneumonia Vaccine 87+ Years old  Completed   INFLUENZA VACCINE  Completed   DEXA SCAN  Completed   Hepatitis C Screening  Completed   Zoster Vaccines- Shingrix  Completed   HPV VACCINES  Aged Out   COLONOSCOPY (Pts 45-100yr Insurance coverage will need to  be confirmed)  Discontinued    Health Maintenance  Health Maintenance Due  Topic Date Due   COVID-19 Vaccine (3 - 2023-24 season) 09/06/2021    Colorectal cancer screening: No longer required.   Mammogram status: No longer required due to age.  Bone Density status: Completed 04/26/2021. Results reflect: Bone density results: NORMAL. Repeat every 2 years.  Lung Cancer Screening: (Bell Dose CT Chest recommended if Age 85-80 years, 30 pack-year currently smoking OR have quit w/in 15years.) does not qualify.   Lung Cancer Screening Referral: n/a  Additional Screening:  Hepatitis C Screening: does qualify; Completed 12/04/2015  Vision Screening: Recommended annual ophthalmology exams for early detection of glaucoma and other disorders of the eye. Is the patient up to date with their annual eye exam?  Yes  Who is the provider or what is the name of the office in which the patient attends annual eye exams? [facility] If pt is not established with a  provider, would they like to be referred to a provider to establish care? No .   Dental Screening: Recommended annual dental exams for proper oral hygiene  Community Resource Referral / Chronic Care Management: CRR required this visit?  No   CCM required this visit?  No      Plan:     I have personally reviewed and noted the following in the patient's chart:   Medical and social history Use of alcohol, tobacco or illicit drugs  Current medications and supplements including opioid prescriptions. Patient is not currently taking opioid prescriptions. Functional ability and status Nutritional status Physical activity Advanced directives List of other physicians Hospitalizations, surgeries, and ER visits in previous 12 months Vitals Screenings to include cognitive, depression, and falls Referrals and appointments  In addition, I have reviewed and discussed with patient certain preventive protocols, quality metrics, and best practice recommendations. A written personalized care plan for preventive services as well as general preventive health recommendations were provided to patient.     Beatrix Fetters, [place]   12/11/2021   Nurse Notes: Non-Face to Face or Face to Face 7 minute visit Encounter   Karen Bell , Thank you for taking time to come for your Medicare Wellness Visit. I appreciate your ongoing commitment to your health goals. Please review the following plan we discussed and let me know if I can assist you in the future.   These are the goals we discussed:  Goals      Increase physical activity     Increase activity.      Patient Stated     Lose weight and keep cholesterol down         This is a list of the screening recommended for you and due dates:  Health Maintenance  Topic Date Due   COVID-19 Vaccine (3 - 2023-24 season) 09/06/2021   Medicare Annual Wellness Visit  12/12/2022   DTaP/Tdap/Td vaccine (3 - Td or Tdap) 04/22/2030   Pneumonia Vaccine   Completed   Flu Shot  Completed   DEXA scan (bone density measurement)  Completed   Hepatitis C Screening: USPSTF Recommendation to screen - Ages 73-79 yo.  Completed   Zoster (Shingles) Vaccine  Completed   HPV Vaccine  Aged Out   Colon Cancer Screening  Discontinued

## 2021-12-12 ENCOUNTER — Other Ambulatory Visit: Payer: Self-pay

## 2021-12-12 ENCOUNTER — Encounter: Payer: Self-pay | Admitting: Family Medicine

## 2021-12-12 MED ORDER — FENOFIBRATE 54 MG PO TABS: 54.0000 mg | ORAL_TABLET | Freq: Every day | ORAL | 1 refills | Status: DC

## 2021-12-12 MED ORDER — HYDROCHLOROTHIAZIDE 12.5 MG PO CAPS: 12.5000 mg | ORAL_CAPSULE | Freq: Every day | ORAL | 1 refills | Status: DC

## 2022-02-09 ENCOUNTER — Other Ambulatory Visit: Payer: Self-pay | Admitting: Family Medicine

## 2022-02-13 ENCOUNTER — Other Ambulatory Visit: Payer: Self-pay | Admitting: Obstetrics & Gynecology

## 2022-02-13 DIAGNOSIS — Z1239 Encounter for other screening for malignant neoplasm of breast: Secondary | ICD-10-CM

## 2022-02-27 ENCOUNTER — Ambulatory Visit
Admission: RE | Admit: 2022-02-27 | Discharge: 2022-02-27 | Disposition: A | Discharge status: Home / Self Care | Payer: Medicare HMO | Source: Ambulatory Visit | Attending: Obstetrics & Gynecology | Admitting: Obstetrics & Gynecology

## 2022-02-27 DIAGNOSIS — Z1239 Encounter for other screening for malignant neoplasm of breast: Secondary | ICD-10-CM

## 2022-03-12 ENCOUNTER — Encounter: Payer: Self-pay | Admitting: Family Medicine

## 2022-03-12 ENCOUNTER — Ambulatory Visit (INDEPENDENT_AMBULATORY_CARE_PROVIDER_SITE_OTHER): Payer: Medicare HMO | Admitting: Family Medicine

## 2022-03-12 VITALS — BP 142/78 | HR 80 | Temp 98.3°F | Ht 65.0 in | Wt 148.4 lb

## 2022-03-12 DIAGNOSIS — M25552 Pain in left hip: Secondary | ICD-10-CM

## 2022-03-12 DIAGNOSIS — K2900 Acute gastritis without bleeding: Secondary | ICD-10-CM | POA: Diagnosis not present

## 2022-03-12 DIAGNOSIS — M7062 Trochanteric bursitis, left hip: Secondary | ICD-10-CM | POA: Diagnosis not present

## 2022-03-12 DIAGNOSIS — R1013 Epigastric pain: Secondary | ICD-10-CM

## 2022-03-12 MED ORDER — PANTOPRAZOLE SODIUM 40 MG PO TBEC: 40.0000 mg | DELAYED_RELEASE_TABLET | Freq: Every day | ORAL | 0 refills | Status: DC

## 2022-03-12 NOTE — Progress Notes (Signed)
OFFICE VISIT  03/12/2022  CC:  Chief Complaint  Patient presents with   Hip Pain    Left; intermittent x 3 months; abd pain x1 mo, LLQ comes and goes; sometimes cramping    Patient is a 77 y.o. female who presents for left hip pain  HPI: About 4 months ago Romig fell onto her left hip.  She was able to get up without problem and it did not really even hurt her.  She had a bruise that went away as expected. Several weeks later she started to get pain in the lateral aspect of the left hip, intermittent, usually worse after sitting for a while and getting up and walking around.  At these times it sometimes also feels like it may give way.  Does not hear any popping or clicking.  Does not hurt in the groin or down the back of the leg.  Occasionally gets a little twinge of pain in the anterior aspect of the lower leg on the left.  Denies any tingling or numbness. No back pain.  Also, about a month ago she started to get pain in the epigastric region, mostly on the left side but does radiate across the center intermittently.  She does feel gassy/bloated more lately.  Appetite is good.  No nausea or vomiting.  No diarrhea or constipation.  No blood in stool.  She does not take NSAIDs with any regularity.  ROS as above, plus--> no fevers, no CP, no SOB, no wheezing, no cough, no dizziness, no HAs, no rashes, no melena/hematochezia.  No polyuria or polydipsia.  No focal weakness, paresthesias, or tremors.  No acute vision or hearing abnormalities.  No dysuria or unusual/new urinary urgency or frequency.  No recent changes in lower legs. No palpitations.       Past Medical History:  Diagnosis Date   Acquired hammertoes of both feet    Anterior subluxation of right shoulder 07/2017   Relocated by Dr. Tamala Julian   Cervical disc disorder with radiculopathy    "   "       "      "   Chronic renal insufficiency, stage 3 (moderate) (HCC)    GFR 50s (avg sCr around 1.0)   Colon cancer screening     Colonoscopy normal 2008.  Cologuard NEG 06/2016.  Repeat 12/2019 NEG. Consider rpt 2024.   Diverticulosis    Duodenal diverticulum 11/2017   pancreatic cyst vs duodenal diverticulum partially obstructing ampulla??  Mild dilatation of CBD and main pancreatic duct.  Repeat MRI 02/2018 showed NORMAL pancreas and confirmed duodenal diverticulum.  CBD normal.   Family history of abdominal aortic aneurysm    pt has had AAA screening x 2, most recent was 02/2015.   Fracture of navicular bone of right wrist 2022   [facility]   Hepatic steatosis    mild (MRI abd 02/2018)   History of migraine    These resolved after menopause   HTN (hypertension)    + hypertensive retinopathy   Hyperlipidemia, mixed    Insomnia    maintenance problems; trazodone no help   Intractable hiccups 10/2017   resolved with PPI and reglan   Left rotator cuff tear arthropathy 04/2015   Dr. Charlann Boxer   Nephrolithiasis 1994   Osteopenia 08/2018   DEXA 08/2018 T-score -1.2.  Rpt 04/2021 T score -0.9. Rpt 2-3 yrs.   Renal cysts, acquired, bilateral 11/12/2017   R kidney with 2.4x 2.4x 2.4cm complex cyst-->MR abd-->Bosniak 1 and  2, no worrisome renal lesion.  Stable on MRI abd 02/2018.   Retinal tear of right eye    Laser retinopexy 9/1 and 9/8, 2020.    Past Surgical History:  Procedure Laterality Date   Aortic ultrasound  02/13/13; 02/2015   no aneurism (screening for high risk)   BUNIONECTOMY  2006   bilat   carotid dopplers (screening for PAD)  11/16/2008   "minor left carotid dz" per old records.  No hx of TIA/CVA.   CESAREAN SECTION  x 2    [place]  07/2006   recall 10 yrs.  'tics.  (Dr. Domenica Fail in [place], [place])   [place]     right index finger, base of fingernail   DEXA  08/2018   2020 T-score -1.2.  04/2021 T score -0.9.  Rpt 2-3 yrs   kidney stone extraction  1992   Cystoscopy: fragmented ureteral stone.  No procedures for this since then.   laser retinopexy Right 9/1 and 9/8,2020.     Outpatient Medications Prior to Visit  Medication Sig Dispense Refill   calcium citrate-vitamin D (CITRACAL+D) 315-200 MG-UNIT per tablet Take 1 tablet by mouth daily.     Cholecalciferol (VITAMIN D) 2000 units tablet Take 2,000 Units by mouth daily.     fenofibrate 54 MG tablet Take 1 tablet (54 mg total) by mouth daily. 90 tablet 1   hydrochlorothiazide (MICROZIDE) 12.5 MG capsule Take 1 capsule (12.5 mg total) by mouth daily. 90 capsule 1   Multiple Vitamins-Minerals (LUTEIN-ZEAXANTHIN PO) Take 125 mg by mouth daily.     Multiple Vitamins-Minerals (MULTIVITAMIN ADULTS PO) Take by mouth daily.     simvastatin (ZOCOR) 40 MG tablet TAKE 1 TABLET BY MOUTH EVERY DAY 90 tablet 0   Turmeric 500 MG CAPS Take by mouth.     No facility-administered medications prior to visit.    Allergies  Allergen Reactions   Metoclopramide Shortness Of Breath    SOB, inc HR, nausea, fatigue   Erythromycin Other (See Comments)    Abdominal pain   Tetracyclines & Related Other (See Comments)    Joint pain   Lisinopril Cough    Review of Systems  As per HPI  PE:    03/12/2022    2:09 PM 03/12/2022    2:00 PM 11/21/2021    8:28 AM  Vitals with BMI  Height  '5\' 5"'$  '5\' 5"'$   Weight  148 lbs 6 oz 144 lbs 10 oz  BMI  [id number]  Systolic [id number]  Diastolic 78 78 79  Pulse  80 68     Physical Exam Exam chaperoned by Shepard General, CMA  Gen: Alert, well appearing.  Patient is oriented to person, place, time, and situation. AFFECT: pleasant, lucid thought and speech. No low back tenderness. Single leg stand on the left reproduces her left lateral hip pain. She has mild tenderness to palpation starting around left greater trochanter region and extending down the lateral aspect of the left thigh.  Range of motion of the left hip is fully intact without pain. No groin tenderness. Abdomen is soft, just a mild amount of epigastric tenderness in the mid and left upper quadrants. No guarding or  rebound.  Bowel sounds are a bit hyperactive. No HSM, bruit, or mass palpable.  LABS:  Last metabolic panel Lab Results  Component Value Date   GLUCOSE 79 11/21/2021   NA 141 11/21/2021   K 4.5 11/21/2021   CL 102  11/21/2021   CO2 32 11/21/2021   BUN 24 11/21/2021   CREATININE 1.01 (H) 11/21/2021   CALCIUM 10.1 11/21/2021   PROT 7.3 11/21/2021   ALBUMIN 4.9 11/20/2020   BILITOT 0.4 11/21/2021   ALKPHOS 40 11/20/2020   AST 20 11/21/2021   ALT 20 11/21/2021   IMPRESSION AND PLAN:  #1 left hip pain, suspect trochanteric pain syndrome. Check left hip radiographs. Recommended physical therapy and patient agreed with this--ordered today.  2.  Epigastric pain and bloating.  Suspect gastritis/dyspepsia. Start pantoprazole 40 mg a day for at least a 2-week trial and reassess. No labs or imaging at this time.  An After Visit Summary was printed and given to the patient.  FOLLOW UP: Return in about 2 weeks (around 03/26/2022) for f/u hip and gastritis.  Signed:  Crissie Sickles, MD           03/12/2022

## 2022-03-13 ENCOUNTER — Ambulatory Visit (HOSPITAL_BASED_OUTPATIENT_CLINIC_OR_DEPARTMENT_OTHER)
Admission: RE | Admit: 2022-03-13 | Discharge: 2022-03-13 | Disposition: A | Discharge status: Home / Self Care | Payer: Medicare HMO | Source: Ambulatory Visit | Attending: Family Medicine | Admitting: Family Medicine

## 2022-03-13 DIAGNOSIS — M25552 Pain in left hip: Secondary | ICD-10-CM | POA: Diagnosis present

## 2022-03-24 ENCOUNTER — Other Ambulatory Visit: Payer: Self-pay | Admitting: Physical Therapy

## 2022-03-24 NOTE — Therapy (Unsigned)
OUTPATIENT PHYSICAL THERAPY LOWER EXTREMITY EVALUATION   Patient Name: Karen Bell MRN: [id number] DOB:Apr 02, 1945, 77 y.o., female Today's Date: 03/25/2022  END OF SESSION:  PT End of Session - 03/25/22 1200     Visit Number 1    Number of Visits 12    Date for PT Re-Evaluation 05/20/22    Authorization Type [employer] MEDICARE HMO/PPO    Progress Note Due on Visit 10    PT Start Time 1050    PT Stop Time 1135    PT Time Calculation (min) 45 min    Activity Tolerance Patient tolerated treatment well    Behavior During Therapy [facility] for tasks assessed/performed             Past Medical History:  Diagnosis Date   Acquired hammertoes of both feet    Anterior subluxation of right shoulder 07/2017   Relocated by Dr. Tamala Julian   Cervical disc disorder with radiculopathy    "   "       "      "   Chronic renal insufficiency, stage 3 (moderate) (HCC)    GFR 50s (avg sCr around 1.0)   Colon cancer screening    Colonoscopy normal 2008.  Cologuard NEG 06/2016.  Repeat 12/2019 NEG. Consider rpt 2024.   Diverticulosis    Duodenal diverticulum 11/2017   pancreatic cyst vs duodenal diverticulum partially obstructing ampulla??  Mild dilatation of CBD and main pancreatic duct.  Repeat MRI 02/2018 showed NORMAL pancreas and confirmed duodenal diverticulum.  CBD normal.   Family history of abdominal aortic aneurysm    pt has had AAA screening x 2, most recent was 02/2015.   Fracture of navicular bone of right wrist 2022   [facility]   Hepatic steatosis    mild (MRI abd 02/2018)   History of migraine    These resolved after menopause   HTN (hypertension)    + hypertensive retinopathy   Hyperlipidemia, mixed    Insomnia    maintenance problems; trazodone no help   Intractable hiccups 10/2017   resolved with PPI and reglan   Left rotator cuff tear arthropathy 04/2015   Dr. Charlann Boxer   Nephrolithiasis 1994   Osteopenia 08/2018   DEXA 08/2018 T-score -1.2.  Rpt 04/2021 T score -0.9. Rpt 2-3  yrs.   Renal cysts, acquired, bilateral 11/12/2017   R kidney with 2.4x 2.4x 2.4cm complex cyst-->MR abd-->Bosniak 1 and 2, no worrisome renal lesion.  Stable on MRI abd 02/2018.   Retinal tear of right eye    Laser retinopexy 9/1 and 9/8, 2020.   Past Surgical History:  Procedure Laterality Date   Aortic ultrasound  02/13/13; 02/2015   no aneurism (screening for high risk)   BUNIONECTOMY  2006   bilat   carotid dopplers (screening for PAD)  11/16/2008   "minor left carotid dz" per old records.  No hx of TIA/CVA.   CESAREAN SECTION  x 2    [place]  07/2006   recall 10 yrs.  'tics.  (Dr. Domenica Fail in [place], [place])   [place]     right index finger, base of fingernail   DEXA  08/2018   2020 T-score -1.2.  04/2021 T score -0.9.  Rpt 2-3 yrs   kidney stone extraction  1992   Cystoscopy: fragmented ureteral stone.  No procedures for this since then.   laser retinopexy Right 9/1 and 9/8,2020.   Patient Active Problem List   Diagnosis  Date Noted   Other fatigue 11/30/2017   Acquired subluxation of right shoulder 08/04/2017   Medicare annual wellness visit, subsequent 05/10/2015   Cervical disc disorder with radiculopathy of cervical region 05/01/2015   Left rotator cuff tear arthropathy 05/01/2015   Essential hypertension 11/09/2013   Hyperlipemia, mixed 11/09/2013   Insomnia due to anxiety and fear 11/09/2013    REFERRING PROVIDER: Tammi Sou, MD  REFERRING DIAG:  Left hip pain [M25.552], Trochanteric bursitis of left hip [M70.62]   THERAPY DIAG:  Pain in left hip - Plan: PT plan of care cert/re-cert  Muscle weakness (generalized) - Plan: PT plan of care cert/re-cert  Other abnormalities of gait and mobility - Plan: PT plan of care cert/re-cert  Other low back pain - Plan: PT plan of care cert/re-cert  Rationale for Evaluation and Treatment: Rehabilitation  ONSET DATE: November 2023  SUBJECTIVE:   SUBJECTIVE STATEMENT: Pt reports falling x1  onto her L hip, but did not notice any immediate pain. She reports a sudden onset of pain a month or two later. She reports noting pain upon standing after sitting for a while that diminishes after a few seconds of standing. "I feel weak as though my leg is going to give out on me if I try to walk". Pt is currently very active and walks outside for 25-40 min with most pain noted when going uphill. She recently noted pain in her groin area.   PERTINENT HISTORY: HTN PAIN:  Are you having pain? Yes: NPRS scale: 6-7/10 Pain location: L hip from lateral hip into groin.  Pain description: Intermittent Grabbing sensation Aggravating factors: Stairs, walking uphill and getting out of a chair.  Relieving factors: ibuprofen, movement   PRECAUTIONS: None  WEIGHT BEARING RESTRICTIONS: No  FALLS:  Has patient fallen in last 6 months? No  LIVING ENVIRONMENT: Lives with: lives with their spouse Lives in: House/apartment Stairs: Yes: Internal: 13 steps; on right going up and External: 3 steps; on right going up and none Has following equipment at home: Single point cane, Walker - 2 wheeled, and shower chair  OCCUPATION: Retired   PLOF: Independent  PATIENT GOALS: Pt would like to reduce her pain with walking and transfers.    OBJECTIVE:   DIAGNOSTIC FINDINGS: Mild bilateral femoroacetabular osteoarthritis.   PATIENT SURVEYS:  FOTO 59.3038%, 66% in 11 visits   COGNITION: Overall cognitive status: Within functional limits for tasks assessed     SENSATION: WFL  MUSCLE LENGTH: Hamstrings: Right 75% deg; Left 75% deg    PALPATION: No tenderness to bursa.   LOWER EXTREMITY ROM:  Active ROM Right eval Left eval  Hip flexion [facility]  Hip abduction [facility]  Hip internal rotation [facility] WFL  Hip external rotation [facility]  Knee flexion [facility] WFL  Knee extension WFL WFL   (Blank rows = not tested)  LOWER EXTREMITY MMT:  MMT Right eval Left eval  Hip flexion 4- 4-  Hip abduction  4+ 4  Hip internal rotation 4+ 4+  Hip external rotation 5 5  Knee flexion 5 5  Knee extension 4+ 4+   (Blank rows = not tested)  LOWER EXTREMITY SPECIAL TESTS:  Hip special tests: Hip scouring test: positive   FUNCTIONAL TESTS:  5 times sit to stand: Assess next session Timed up and go (TUG): Assess next session  GAIT: Distance walked: 65ft Assistive device utilized: None Level of assistance: Complete Independence Comments: Decreased WB and stance time on L LE upon standing.  TODAY'S TREATMENT:                                                                                                                              DATE: Creating, reviewing, and completing below HEP    PATIENT EDUCATION:  Education details: Educated pt on anatomy and physiology of current symptoms, FOTO, diagnosis, prognosis, HEP,  and POC. Person educated: Patient Education method: Customer service manager Education comprehension: verbalized understanding and returned demonstration  HOME EXERCISE PROGRAM: Access Code: [email or web address] URL: [email or web address] Date: 03/25/2022 Prepared by: Rudi Heap  Exercises - Supine Lower Trunk Rotation  - 1 x daily - 7 x weekly - 1 sets - 10 reps - Supine Single Knee to Chest Stretch  - 1 x daily - 7 x weekly - 1 sets - 2 reps - 30 hold - Supine Piriformis Stretch with Foot on Ground  - 1 x daily - 7 x weekly - 1 sets - 2 reps - 30 hold - Supine Bridge  - 1 x daily - 7 x weekly - 2 sets - 10 reps - Supine Posterior Pelvic Tilt  - 1 x daily - 7 x weekly - 2 sets - 10 reps - Seated Hamstring Stretch  - 1 x daily - 7 x weekly - 1 sets - 2 reps - 30 hold   ASSESSMENT:  CLINICAL IMPRESSION: Patient referred to PT for L hip pain and mild OA. She demonstrates full functional ROM in bilat LE's, and decreased strength mainly in bilat hips. She is very active and enjoys walking and hanging out with her grand kids. She states that she has only had 1 fall,  but notes weakness in her L hip upon standing. She is worried about falling again and floor recovery. Pt would like to improve her gait mechanics, balance and strength to be able to perform recreational and functional activities. Patient will benefit from skilled PT to address below impairments, limitations and improve overall function.  OBJECTIVE IMPAIRMENTS: decreased activity tolerance, difficulty walking, decreased balance, decreased endurance, decreased mobility, decreased ROM, decreased strength, impaired flexibility, impaired UE/LE use, postural dysfunction, and pain.  ACTIVITY LIMITATIONS: bending, lifting, carry, locomotion, cleaning, community activity, driving, and or occupation  PERSONAL FACTORS: HTN are also affecting patient's functional outcome.  REHAB POTENTIAL: Good  CLINICAL DECISION MAKING: Stable/uncomplicated  EVALUATION COMPLEXITY: Low    GOALS: Short term PT Goals Target date: 04/08/2022  Pt will be I and compliant with HEP. Baseline:  Goal status: New Pt will decrease pain by 25% overall Baseline: Goal status: New  Long term PT goals Target date: 05/20/2022  Pt will improve  hip/knee strength to at least 5-/5 MMT to improve functional strength Baseline: Goal status: New Pt will improve FOTO to at least 66% functional to show improved function Baseline: Goal status: New Pt will reduce pain by overall 50% overall with usual activity Baseline: Goal status: New Pt will reduce pain to overall less than 2-3/10 with  usual activity and work activity. Baseline: Goal status: New Pt will be able to ambulate community distances at least 1000 ft WNL gait pattern without complaints Baseline: Goal status: New Pt will be able to perform floor recovery without any complications.  Baseline: Goal status: New Pt will be able to transfer out of a chair without complaints of stiffness and pain.  Baseline: Goal status: New   PLAN: PT FREQUENCY: 1-2 times per week    PT DURATION: 6-8 weeks  PLANNED INTERVENTIONS (unless contraindicated): aquatic PT, Canalith repositioning, cryotherapy, Electrical stimulation, Iontophoresis with 4 mg/ml dexamethasome, Moist heat, traction, Ultrasound, gait training, Therapeutic exercise, balance training, neuromuscular re-education, patient/family education, prosthetic training, manual techniques, passive ROM, dry needling, taping, vasopnuematic device, vestibular, spinal manipulations, joint manipulations  PLAN FOR NEXT SESSION: Assess HEP/update PRN, continue to progress functional mobility, strengthen prox hip muscles. Decrease patients pain. Nustep, floor recovery, 5 x STS, TUG.      Lynden Ang, PT 03/25/2022, 12:02 PM

## 2022-03-25 ENCOUNTER — Ambulatory Visit: Payer: Medicare HMO | Attending: Family Medicine | Admitting: Physical Therapy

## 2022-03-25 ENCOUNTER — Other Ambulatory Visit: Payer: Self-pay

## 2022-03-25 DIAGNOSIS — M7062 Trochanteric bursitis, left hip: Secondary | ICD-10-CM | POA: Diagnosis not present

## 2022-03-25 DIAGNOSIS — R2689 Other abnormalities of gait and mobility: Secondary | ICD-10-CM | POA: Insufficient documentation

## 2022-03-25 DIAGNOSIS — M5459 Other low back pain: Secondary | ICD-10-CM | POA: Diagnosis not present

## 2022-03-25 DIAGNOSIS — M25552 Pain in left hip: Secondary | ICD-10-CM | POA: Insufficient documentation

## 2022-03-25 DIAGNOSIS — M6281 Muscle weakness (generalized): Secondary | ICD-10-CM | POA: Insufficient documentation

## 2022-03-26 ENCOUNTER — Encounter: Payer: Self-pay | Admitting: Family Medicine

## 2022-03-26 ENCOUNTER — Ambulatory Visit (INDEPENDENT_AMBULATORY_CARE_PROVIDER_SITE_OTHER): Payer: Medicare HMO | Admitting: Family Medicine

## 2022-03-26 VITALS — BP 134/76 | HR 67 | Temp 98.1°F | Ht 65.0 in | Wt 148.2 lb

## 2022-03-26 DIAGNOSIS — M25552 Pain in left hip: Secondary | ICD-10-CM | POA: Diagnosis not present

## 2022-03-26 DIAGNOSIS — R1013 Epigastric pain: Secondary | ICD-10-CM | POA: Diagnosis not present

## 2022-03-26 NOTE — Progress Notes (Signed)
OFFICE VISIT  03/26/2022  CC:  Chief Complaint  Patient presents with   2 Week Follow-up    Pt reports leg is still the same but spine is much better. PT eval [facility] yesterday    Patient is a 77 y.o. female who presents for 2-week follow-up left hip pain and epigastric pain. A/P as of last visit: "1 left hip pain, suspect trochanteric pain syndrome. Check left hip radiographs. Recommended physical therapy and patient agreed with this--ordered today.   2.  Epigastric pain and bloating.  Suspect gastritis/dyspepsia. Start pantoprazole 40 mg a day for at least a 2-week trial and reassess. No labs or imaging at this time"  INTERIM HX: Feeling much better regarding her stomach.  Just a twinge of pain since I last saw her.  She got PT evaluation yesterday her left hip. She says it feels about the same over the last 2 weeks--- hurts on the lateral aspect, particularly when standing up and walking after she has been sitting for a long time or when she starts to walk on a pill great.  Past Medical History:  Diagnosis Date   Acquired hammertoes of both feet    Anterior subluxation of right shoulder 07/2017   Relocated by Dr. Tamala Julian   Cervical disc disorder with radiculopathy    "   "       "      "   Chronic renal insufficiency, stage 3 (moderate) (HCC)    GFR 50s (avg sCr around 1.0)   Colon cancer screening    Colonoscopy normal 2008.  Cologuard NEG 06/2016.  Repeat 12/2019 NEG. Consider rpt 2024.   Diverticulosis    Duodenal diverticulum 11/2017   pancreatic cyst vs duodenal diverticulum partially obstructing ampulla??  Mild dilatation of CBD and main pancreatic duct.  Repeat MRI 02/2018 showed NORMAL pancreas and confirmed duodenal diverticulum.  CBD normal.   Family history of abdominal aortic aneurysm    pt has had AAA screening x 2, most recent was 02/2015.   Fracture of navicular bone of right wrist 2022   [facility]   Hepatic steatosis    mild (MRI abd 02/2018)   History  of migraine    These resolved after menopause   HTN (hypertension)    + hypertensive retinopathy   Hyperlipidemia, mixed    Insomnia    maintenance problems; trazodone no help   Intractable hiccups 10/2017   resolved with PPI and reglan   Left rotator cuff tear arthropathy 04/2015   Dr. Charlann Boxer   Nephrolithiasis 1994   Osteopenia 08/2018   DEXA 08/2018 T-score -1.2.  Rpt 04/2021 T score -0.9. Rpt 2-3 yrs.   Renal cysts, acquired, bilateral 11/12/2017   R kidney with 2.4x 2.4x 2.4cm complex cyst-->MR abd-->Bosniak 1 and 2, no worrisome renal lesion.  Stable on MRI abd 02/2018.   Retinal tear of right eye    Laser retinopexy 9/1 and 9/8, 2020.    Past Surgical History:  Procedure Laterality Date   Aortic ultrasound  02/13/13; 02/2015   no aneurism (screening for high risk)   BUNIONECTOMY  2006   bilat   carotid dopplers (screening for PAD)  11/16/2008   "minor left carotid dz" per old records.  No hx of TIA/CVA.   CESAREAN SECTION  x 2    [place]  07/2006   recall 10 yrs.  'tics.  (Dr. Domenica Fail in [place], [place])   [place]  right index finger, base of fingernail   DEXA  08/2018   2020 T-score -1.2.  04/2021 T score -0.9.  Rpt 2-3 yrs   kidney stone extraction  1992   Cystoscopy: fragmented ureteral stone.  No procedures for this since then.   laser retinopexy Right 9/1 and 9/8,2020.    Outpatient Medications Prior to Visit  Medication Sig Dispense Refill   calcium citrate-vitamin D (CITRACAL+D) 315-200 MG-UNIT per tablet Take 1 tablet by mouth daily.     Cholecalciferol (VITAMIN D) 2000 units tablet Take 2,000 Units by mouth daily.     fenofibrate 54 MG tablet Take 1 tablet (54 mg total) by mouth daily. 90 tablet 1   hydrochlorothiazide (MICROZIDE) 12.5 MG capsule Take 1 capsule (12.5 mg total) by mouth daily. 90 capsule 1   Multiple Vitamins-Minerals (LUTEIN-ZEAXANTHIN PO) Take 125 mg by mouth daily.     Multiple Vitamins-Minerals (MULTIVITAMIN ADULTS  PO) Take by mouth daily.     pantoprazole (PROTONIX) 40 MG tablet Take 1 tablet (40 mg total) by mouth daily. 30 tablet 0   simvastatin (ZOCOR) 40 MG tablet TAKE 1 TABLET BY MOUTH EVERY DAY 90 tablet 0   Turmeric 500 MG CAPS Take by mouth.     No facility-administered medications prior to visit.    Allergies  Allergen Reactions   Metoclopramide Shortness Of Breath    SOB, inc HR, nausea, fatigue   Erythromycin Other (See Comments)    Abdominal pain   Tetracyclines & Related Other (See Comments)    Joint pain   Lisinopril Cough    Review of Systems As per HPI  PE:    03/26/2022    2:18 PM 03/26/2022    1:56 PM 03/12/2022    2:09 PM  Vitals with BMI  Height  5\' 5"    Weight  148 lbs 3 oz   BMI  123456   Systolic Q000111Q Q000111Q [id number]  Diastolic 76 85 78  Pulse  67     Physical Exam  Gen: Alert, well appearing.  Patient is oriented to person, place, time, and situation.  No further exam today  LABS:  Last CBC Lab Results  Component Value Date   WBC 6.5 11/21/2021   HGB 14.1 11/21/2021   HCT 41.5 11/21/2021   MCV 88.3 11/21/2021   MCH 30.0 11/21/2021   RDW 12.7 11/21/2021   PLT 278 [id number]   Last metabolic panel Lab Results  Component Value Date   GLUCOSE 79 11/21/2021   NA 141 11/21/2021   K 4.5 11/21/2021   CL 102 11/21/2021   CO2 32 11/21/2021   BUN 24 11/21/2021   CREATININE 1.01 (H) 11/21/2021   CALCIUM 10.1 11/21/2021   PROT 7.3 11/21/2021   ALBUMIN 4.9 11/20/2020   BILITOT 0.4 11/21/2021   ALKPHOS 40 11/20/2020   AST 20 11/21/2021   ALT 20 11/21/2021   IMPRESSION AND PLAN:  #1 dyspepsia. Nearly completely resolved.  Finish out 2 more weeks of pantoprazole and then change to over-the-counter strength omeprazole for 2 weeks and then discontinue medication altogether.  2.  Left hip trochanteric pain syndrome. She will start PT this week. She will call or return if not improving with PT.  An After Visit Summary was printed and given to the  patient.  FOLLOW UP: Return if symptoms worsen or fail to improve.  Signed:  Crissie Sickles, MD           03/26/2022

## 2022-03-28 ENCOUNTER — Ambulatory Visit: Payer: Medicare HMO

## 2022-03-28 DIAGNOSIS — M25552 Pain in left hip: Secondary | ICD-10-CM

## 2022-03-28 DIAGNOSIS — M5459 Other low back pain: Secondary | ICD-10-CM

## 2022-03-28 DIAGNOSIS — R2689 Other abnormalities of gait and mobility: Secondary | ICD-10-CM

## 2022-03-28 DIAGNOSIS — M6281 Muscle weakness (generalized): Secondary | ICD-10-CM

## 2022-03-28 DIAGNOSIS — R252 Cramp and spasm: Secondary | ICD-10-CM

## 2022-03-28 NOTE — Therapy (Signed)
OUTPATIENT PHYSICAL THERAPY LOWER EXTREMITY EVALUATION   Patient Name: Karen Bell MRN: [id number] DOB:1945/07/05, 77 y.o., female Today's Date: 03/28/2022  END OF SESSION:  PT End of Session - 03/28/22 0758     Visit Number 2    Number of Visits 12    Date for PT Re-Evaluation 05/20/22    Authorization Type [employer] MEDICARE HMO/PPO    Progress Note Due on Visit 10    PT Start Time 0758    PT Stop Time 0846    PT Time Calculation (min) 48 min    Activity Tolerance Patient tolerated treatment well    Behavior During Therapy [facility] for tasks assessed/performed             Past Medical History:  Diagnosis Date   Acquired hammertoes of both feet    Anterior subluxation of right shoulder 07/2017   Relocated by Dr. Tamala Julian   Cervical disc disorder with radiculopathy    "   "       "      "   Chronic renal insufficiency, stage 3 (moderate) (HCC)    GFR 50s (avg sCr around 1.0)   Colon cancer screening    Colonoscopy normal 2008.  Cologuard NEG 06/2016.  Repeat 12/2019 NEG. Consider rpt 2024.   Diverticulosis    Duodenal diverticulum 11/2017   pancreatic cyst vs duodenal diverticulum partially obstructing ampulla??  Mild dilatation of CBD and main pancreatic duct.  Repeat MRI 02/2018 showed NORMAL pancreas and confirmed duodenal diverticulum.  CBD normal.   Family history of abdominal aortic aneurysm    pt has had AAA screening x 2, most recent was 02/2015.   Fracture of navicular bone of right wrist 2022   [facility]   Hepatic steatosis    mild (MRI abd 02/2018)   History of migraine    These resolved after menopause   HTN (hypertension)    + hypertensive retinopathy   Hyperlipidemia, mixed    Insomnia    maintenance problems; trazodone no help   Intractable hiccups 10/2017   resolved with PPI and reglan   Left rotator cuff tear arthropathy 04/2015   Dr. Charlann Boxer   Nephrolithiasis 1994   Osteoarthritis    hips, mild on radiographs 03/2022   Osteopenia 08/2018   DEXA  08/2018 T-score -1.2.  Rpt 04/2021 T score -0.9. Rpt 2-3 yrs.   Renal cysts, acquired, bilateral 11/12/2017   R kidney with 2.4x 2.4x 2.4cm complex cyst-->MR abd-->Bosniak 1 and 2, no worrisome renal lesion.  Stable on MRI abd 02/2018.   Retinal tear of right eye    Laser retinopexy 9/1 and 9/8, 2020.   Past Surgical History:  Procedure Laterality Date   Aortic ultrasound  02/13/13; 02/2015   no aneurism (screening for high risk)   BUNIONECTOMY  2006   bilat   carotid dopplers (screening for PAD)  11/16/2008   "minor left carotid dz" per old records.  No hx of TIA/CVA.   CESAREAN SECTION  x 2    [place]  07/2006   recall 10 yrs.  'tics.  (Dr. Domenica Fail in [place], [place])   [place]     right index finger, base of fingernail   DEXA  08/2018   2020 T-score -1.2.  04/2021 T score -0.9.  Rpt 2-3 yrs   kidney stone extraction  1992   Cystoscopy: fragmented ureteral stone.  No procedures for this since then.   laser retinopexy Right 9/1  and 9/8,2020.   Patient Active Problem List   Diagnosis Date Noted   Other fatigue 11/30/2017   Acquired subluxation of right shoulder 08/04/2017   Medicare annual wellness visit, subsequent 05/10/2015   Cervical disc disorder with radiculopathy of cervical region 05/01/2015   Left rotator cuff tear arthropathy 05/01/2015   Essential hypertension 11/09/2013   Hyperlipemia, mixed 11/09/2013   Insomnia due to anxiety and fear 11/09/2013    REFERRING PROVIDER: Tammi Sou, MD  REFERRING DIAG:  Left hip pain [M25.552], Trochanteric bursitis of left hip [M70.62]   THERAPY DIAG:  Pain in left hip  Muscle weakness (generalized)  Other abnormalities of gait and mobility  Other low back pain  Cramp and spasm  Rationale for Evaluation and Treatment: Rehabilitation  ONSET DATE: November 2023  SUBJECTIVE:   SUBJECTIVE STATEMENT: Pt reports she did fine with the exercises Simone assigned.  She continues to experience post rest  stiffness.    PERTINENT HISTORY: HTN PAIN:  Are you having pain? Yes: NPRS scale: 6-7/10 Pain location: L hip from lateral hip into groin.  Pain description: Intermittent Grabbing sensation Aggravating factors: Stairs, walking uphill and getting out of a chair.  Relieving factors: ibuprofen, movement   PRECAUTIONS: None  WEIGHT BEARING RESTRICTIONS: No  FALLS:  Has patient fallen in last 6 months? No  LIVING ENVIRONMENT: Lives with: lives with their spouse Lives in: House/apartment Stairs: Yes: Internal: 13 steps; on right going up and External: 3 steps; on right going up and none Has following equipment at home: Single point cane, Walker - 2 wheeled, and shower chair  OCCUPATION: Retired   PLOF: Independent  PATIENT GOALS: Pt would like to reduce her pain with walking and transfers.    OBJECTIVE:   DIAGNOSTIC FINDINGS: Mild bilateral femoroacetabular osteoarthritis.   PATIENT SURVEYS:  FOTO 59.3038%, 66% in 11 visits   COGNITION: Overall cognitive status: Within functional limits for tasks assessed     SENSATION: WFL  MUSCLE LENGTH: Hamstrings: Right 75% deg; Left 75% deg    PALPATION: No tenderness to bursa.   LOWER EXTREMITY ROM:  Active ROM Right eval Left eval  Hip flexion [facility]  Hip abduction [facility]  Hip internal rotation [facility] WFL  Hip external rotation [facility]  Knee flexion [facility] WFL  Knee extension WFL WFL   (Blank rows = not tested)  LOWER EXTREMITY MMT:  MMT Right eval Left eval  Hip flexion 4- 4-  Hip abduction 4+ 4  Hip internal rotation 4+ 4+  Hip external rotation 5 5  Knee flexion 5 5  Knee extension 4+ 4+   (Blank rows = not tested)  LOWER EXTREMITY SPECIAL TESTS:  Hip special tests: Hip scouring test: positive   FUNCTIONAL TESTS:  5 times sit to stand: Assess next session Timed up and go (TUG): Assess next session  GAIT: Distance walked: 19ft Assistive device utilized: None Level of assistance: Complete  Independence Comments: Decreased WB and stance time on L LE upon standing.    TODAY'S TREATMENT:  DATE: 03/28/22 Nustep x 5 min level 5 (PT present to discuss progress and goals) Reviewed HEP Added IT band stretch 2 x 30 sec both Added seated piriformis stretch 2 x 30 sec both Added standing quad/hip flexor stretch 2 x 30 sec Ice x 10 min to left hip seated   DATE: Creating, reviewing, and completing below HEP    PATIENT EDUCATION:  Education details: Educated pt on anatomy and physiology of current symptoms, FOTO, diagnosis, prognosis, HEP,  and POC. Person educated: Patient Education method: Customer service manager Education comprehension: verbalized understanding and returned demonstration  HOME EXERCISE PROGRAM: Access Code: [email or web address] URL: [email or web address] Date: 03/28/2022 Prepared by: Candyce Churn  Exercises - Supine Lower Trunk Rotation  - 1 x daily - 7 x weekly - 1 sets - 10 reps - Supine Single Knee to Chest Stretch  - 1 x daily - 7 x weekly - 1 sets - 2 reps - 30 hold - Supine Piriformis Stretch with Foot on Ground  - 1 x daily - 7 x weekly - 1 sets - 2 reps - 30 hold - Supine Bridge  - 1 x daily - 7 x weekly - 2 sets - 10 reps - Supine Posterior Pelvic Tilt  - 1 x daily - 7 x weekly - 2 sets - 10 reps - Supine ITB Stretch with Strap  - 1 x daily - 7 x weekly - 1 sets - 2 reps - 30 sec hold - Seated Hamstring Stretch  - 1 x daily - 7 x weekly - 1 sets - 2 reps - 30 hold - Seated Piriformis Stretch with Trunk Bend  - 1 x daily - 7 x weekly - 1 sets - 2 reps - 30 hold - Standing Quad Stretch with Table and Chair Support  - 1 x daily - 7 x weekly - 1 sets - 2 reps - 30 sec hold  ASSESSMENT:  CLINICAL IMPRESSION: Chalese responded well to initial stretches.  She was able to demonstrate HEP with minimal verbal cues.  We added  IT band stretches along with other option for piriformis stretch and quad/hip flexor stretch.  She was able to do all of these with no increase in pain.  She is compliant and well motivated.  Patient will benefit from skilled PT to address below impairments, limitations and improve overall function.  OBJECTIVE IMPAIRMENTS: decreased activity tolerance, difficulty walking, decreased balance, decreased endurance, decreased mobility, decreased ROM, decreased strength, impaired flexibility, impaired UE/LE use, postural dysfunction, and pain.  ACTIVITY LIMITATIONS: bending, lifting, carry, locomotion, cleaning, community activity, driving, and or occupation  PERSONAL FACTORS: HTN are also affecting patient's functional outcome.  REHAB POTENTIAL: Good  CLINICAL DECISION MAKING: Stable/uncomplicated  EVALUATION COMPLEXITY: Low    GOALS: Short term PT Goals Target date: 04/08/2022  Pt will be I and compliant with HEP. Baseline:  Goal status: New Pt will decrease pain by 25% overall Baseline: Goal status: New  Long term PT goals Target date: 05/20/2022  Pt will improve  hip/knee strength to at least 5-/5 MMT to improve functional strength Baseline: Goal status: New Pt will improve FOTO to at least 66% functional to show improved function Baseline: Goal status: New Pt will reduce pain by overall 50% overall with usual activity Baseline: Goal status: New Pt will reduce pain to overall less than 2-3/10 with usual activity and work activity. Baseline: Goal status: New Pt will be able to ambulate community distances at least 1000 ft WNL gait pattern without complaints Baseline:  Goal status: New Pt will be able to perform floor recovery without any complications.  Baseline: Goal status: New Pt will be able to transfer out of a chair without complaints of stiffness and pain.  Baseline: Goal status: New   PLAN: PT FREQUENCY: 1-2 times per week   PT DURATION: 6-8 weeks  PLANNED  INTERVENTIONS (unless contraindicated): aquatic PT, Canalith repositioning, cryotherapy, Electrical stimulation, Iontophoresis with 4 mg/ml dexamethasome, Moist heat, traction, Ultrasound, gait training, Therapeutic exercise, balance training, neuromuscular re-education, patient/family education, prosthetic training, manual techniques, passive ROM, dry needling, taping, vasopnuematic device, vestibular, spinal manipulations, joint manipulations  PLAN FOR NEXT SESSION: Assess HEP/update PRN, continue to progress functional mobility, strengthen prox hip muscles. Decrease patients pain. Nustep, floor recovery, 5 x STS, TUG.   Anderson Malta B. Khairi Garman, PT 03/28/22 8:47 AM [place] [address] [place], [place] [address] Phone # [phone] Fax [phone]

## 2022-03-31 ENCOUNTER — Ambulatory Visit: Payer: Medicare HMO

## 2022-03-31 DIAGNOSIS — M25552 Pain in left hip: Secondary | ICD-10-CM | POA: Diagnosis not present

## 2022-03-31 DIAGNOSIS — R252 Cramp and spasm: Secondary | ICD-10-CM

## 2022-03-31 DIAGNOSIS — M5459 Other low back pain: Secondary | ICD-10-CM

## 2022-03-31 DIAGNOSIS — R2689 Other abnormalities of gait and mobility: Secondary | ICD-10-CM

## 2022-03-31 DIAGNOSIS — M6281 Muscle weakness (generalized): Secondary | ICD-10-CM

## 2022-03-31 NOTE — Therapy (Signed)
OUTPATIENT PHYSICAL THERAPY LOWER EXTREMITY EVALUATION   Patient Name: Karen Bell MRN: [id number] DOB:[birth year], 77 y.o., female Today's Date: 03/31/2022  END OF SESSION:  PT End of Session - 03/31/22 0926     Visit Number 3    Number of Visits 12    Date for PT Re-Evaluation 05/20/22    Authorization Type [employer] MEDICARE HMO/PPO    Progress Note Due on Visit 10    PT Start Time 0928    PT Stop Time 1030    PT Time Calculation (min) 62 min    Activity Tolerance Patient tolerated treatment well    Behavior During Therapy [facility] for tasks assessed/performed             Past Medical History:  Diagnosis Date   Acquired hammertoes of both feet    Anterior subluxation of right shoulder 07/2017   Relocated by Dr. Tamala Julian   Cervical disc disorder with radiculopathy    "   "       "      "   Chronic renal insufficiency, stage 3 (moderate) (HCC)    GFR 50s (avg sCr around 1.0)   Colon cancer screening    Colonoscopy normal 2008.  Cologuard NEG 06/2016.  Repeat 12/2019 NEG. Consider rpt 2024.   Diverticulosis    Duodenal diverticulum 11/2017   pancreatic cyst vs duodenal diverticulum partially obstructing ampulla??  Mild dilatation of CBD and main pancreatic duct.  Repeat MRI 02/2018 showed NORMAL pancreas and confirmed duodenal diverticulum.  CBD normal.   Family history of abdominal aortic aneurysm    pt has had AAA screening x 2, most recent was 02/2015.   Fracture of navicular bone of right wrist 2022   [facility]   Hepatic steatosis    mild (MRI abd 02/2018)   History of migraine    These resolved after menopause   HTN (hypertension)    + hypertensive retinopathy   Hyperlipidemia, mixed    Insomnia    maintenance problems; trazodone no help   Intractable hiccups 10/2017   resolved with PPI and reglan   Left rotator cuff tear arthropathy 04/2015   Dr. Charlann Boxer   Nephrolithiasis 1994   Osteoarthritis    hips, mild on radiographs 03/2022   Osteopenia 08/2018   DEXA  08/2018 T-score -1.2.  Rpt 04/2021 T score -0.9. Rpt 2-3 yrs.   Renal cysts, acquired, bilateral 11/12/2017   R kidney with 2.4x 2.4x 2.4cm complex cyst-->MR abd-->Bosniak 1 and 2, no worrisome renal lesion.  Stable on MRI abd 02/2018.   Retinal tear of right eye    Laser retinopexy 9/1 and 9/8, 2020.   Past Surgical History:  Procedure Laterality Date   Aortic ultrasound  02/13/13; 02/2015   no aneurism (screening for high risk)   BUNIONECTOMY  2006   bilat   carotid dopplers (screening for PAD)  11/16/2008   "minor left carotid dz" per old records.  No hx of TIA/CVA.   CESAREAN SECTION  x 2    [place]  07/2006   recall 10 yrs.  'tics.  (Dr. Domenica Fail in [place], [place])   [place]     right index finger, base of fingernail   DEXA  08/2018   2020 T-score -1.2.  04/2021 T score -0.9.  Rpt 2-3 yrs   kidney stone extraction  1992   Cystoscopy: fragmented ureteral stone.  No procedures for this since then.   laser retinopexy Right 9/1  and 9/8,2020.   Patient Active Problem List   Diagnosis Date Noted   Other fatigue 11/30/2017   Acquired subluxation of right shoulder 08/04/2017   Medicare annual wellness visit, subsequent 05/10/2015   Cervical disc disorder with radiculopathy of cervical region 05/01/2015   Left rotator cuff tear arthropathy 05/01/2015   Essential hypertension 11/09/2013   Hyperlipemia, mixed 11/09/2013   Insomnia due to anxiety and fear 11/09/2013    REFERRING PROVIDER: Tammi Sou, MD  REFERRING DIAG:  Left hip pain [M25.552], Trochanteric bursitis of left hip [M70.62]   THERAPY DIAG:  Pain in left hip  Muscle weakness (generalized)  Other abnormalities of gait and mobility  Other low back pain  Cramp and spasm  Rationale for Evaluation and Treatment: Rehabilitation  ONSET DATE: November 2023  SUBJECTIVE:   SUBJECTIVE STATEMENT: Pt reports she is having less pain and is icing after each exercise session.  She states she still  has some pain with getting in/out car but overall improvement.    PERTINENT HISTORY: HTN PAIN:  Are you having pain? Yes: NPRS scale: 6-7/10 Pain location: L hip from lateral hip into groin.  Pain description: Intermittent Grabbing sensation Aggravating factors: Stairs, walking uphill and getting out of a chair.  Relieving factors: ibuprofen, movement   PRECAUTIONS: None  WEIGHT BEARING RESTRICTIONS: No  FALLS:  Has patient fallen in last 6 months? No  LIVING ENVIRONMENT: Lives with: lives with their spouse Lives in: House/apartment Stairs: Yes: Internal: 13 steps; on right going up and External: 3 steps; on right going up and none Has following equipment at home: Single point cane, Walker - 2 wheeled, and shower chair  OCCUPATION: Retired   PLOF: Independent  PATIENT GOALS: Pt would like to reduce her pain with walking and transfers.    OBJECTIVE:   DIAGNOSTIC FINDINGS: Mild bilateral femoroacetabular osteoarthritis.   PATIENT SURVEYS:  FOTO 59.3038%, 66% in 11 visits   COGNITION: Overall cognitive status: Within functional limits for tasks assessed     SENSATION: WFL  MUSCLE LENGTH: Hamstrings: Right 75% deg; Left 75% deg    PALPATION: No tenderness to bursa.   LOWER EXTREMITY ROM:  Active ROM Right eval Left eval  Hip flexion [facility]  Hip abduction [facility]  Hip internal rotation [facility] WFL  Hip external rotation [facility]  Knee flexion [facility] WFL  Knee extension WFL WFL   (Blank rows = not tested)  LOWER EXTREMITY MMT:  MMT Right eval Left eval  Hip flexion 4- 4-  Hip abduction 4+ 4  Hip internal rotation 4+ 4+  Hip external rotation 5 5  Knee flexion 5 5  Knee extension 4+ 4+   (Blank rows = not tested)  LOWER EXTREMITY SPECIAL TESTS:  Hip special tests: Hip scouring test: positive   FUNCTIONAL TESTS:  5 times sit to stand: 10.83 sec Timed up and go (TUG): 7.80 sec  GAIT: Distance walked: 83ft Assistive device utilized: None Level of  assistance: Complete Independence Comments: Decreased WB and stance time on L LE upon standing.    TODAY'S TREATMENT:  DATE: 03/31/22 Nustep x 5 min level 5 (PT present to discuss progress and goals) Standing hamstring stretch at steps 3 x 30 sec Standing hip flexor/quad stretch 3 x 30 sec Completed 5 x sit to stand and TUG  IT band stretch 3 x 30 sec both Supine SLR x 20 0# left Side lying hip abduction x 20 with 0# left Prone hip extension x 20 with 0# left Side lying hip adduction x 20 with 0# left Seated piriformis stretch 2 x 30 sec both Ice x 10 min to left hip seated   DATE: 03/28/22 Nustep x 5 min level 5 (PT present to discuss progress and goals) Reviewed HEP Added IT band stretch 2 x 30 sec both Added seated piriformis stretch 2 x 30 sec both Added standing quad/hip flexor stretch 2 x 30 sec Ice x 10 min to left hip seated   DATE: Creating, reviewing, and completing below HEP    PATIENT EDUCATION:  Education details: Educated pt on anatomy and physiology of current symptoms, FOTO, diagnosis, prognosis, HEP,  and POC. Person educated: Patient Education method: Customer service manager Education comprehension: verbalized understanding and returned demonstration  HOME EXERCISE PROGRAM: Access Code: [email or web address] URL: [email or web address] Date: 03/28/2022 Prepared by: Candyce Churn  Exercises - Supine Lower Trunk Rotation  - 1 x daily - 7 x weekly - 1 sets - 10 reps - Supine Single Knee to Chest Stretch  - 1 x daily - 7 x weekly - 1 sets - 2 reps - 30 hold - Supine Piriformis Stretch with Foot on Ground  - 1 x daily - 7 x weekly - 1 sets - 2 reps - 30 hold - Supine Bridge  - 1 x daily - 7 x weekly - 2 sets - 10 reps - Supine Posterior Pelvic Tilt  - 1 x daily - 7 x weekly - 2 sets - 10 reps - Supine ITB Stretch with Strap  - 1  x daily - 7 x weekly - 1 sets - 2 reps - 30 sec hold - Seated Hamstring Stretch  - 1 x daily - 7 x weekly - 1 sets - 2 reps - 30 hold - Seated Piriformis Stretch with Trunk Bend  - 1 x daily - 7 x weekly - 1 sets - 2 reps - 30 hold - Standing Quad Stretch with Table and Chair Support  - 1 x daily - 7 x weekly - 1 sets - 2 reps - 30 sec hold  ASSESSMENT:  CLINICAL IMPRESSION: Karen Bell is progressing appropriately.  She is having less pain and states that her leg does not feel as weak.  She is compliant and well motivated.  She should continue to improve.    Patient will benefit from skilled PT to address below impairments, limitations and improve overall function.  OBJECTIVE IMPAIRMENTS: decreased activity tolerance, difficulty walking, decreased balance, decreased endurance, decreased mobility, decreased ROM, decreased strength, impaired flexibility, impaired UE/LE use, postural dysfunction, and pain.  ACTIVITY LIMITATIONS: bending, lifting, carry, locomotion, cleaning, community activity, driving, and or occupation  PERSONAL FACTORS: HTN are also affecting patient's functional outcome.  REHAB POTENTIAL: Good  CLINICAL DECISION MAKING: Stable/uncomplicated  EVALUATION COMPLEXITY: Low    GOALS: Short term PT Goals Target date: 04/08/2022  Pt will be I and compliant with HEP. Baseline:  Goal status: New Pt will decrease pain by 25% overall Baseline: Goal status: New  Long term PT goals Target date: 05/20/2022  Pt will improve  hip/knee strength to at least  5-/5 MMT to improve functional strength Baseline: Goal status: New Pt will improve FOTO to at least 66% functional to show improved function Baseline: Goal status: New Pt will reduce pain by overall 50% overall with usual activity Baseline: Goal status: New Pt will reduce pain to overall less than 2-3/10 with usual activity and work activity. Baseline: Goal status: New Pt will be able to ambulate community distances at least  1000 ft WNL gait pattern without complaints Baseline: Goal status: New Pt will be able to perform floor recovery without any complications.  Baseline: Goal status: New Pt will be able to transfer out of a chair without complaints of stiffness and pain.  Baseline: Goal status: New   PLAN: PT FREQUENCY: 1-2 times per week   PT DURATION: 6-8 weeks  PLANNED INTERVENTIONS (unless contraindicated): aquatic PT, Canalith repositioning, cryotherapy, Electrical stimulation, Iontophoresis with 4 mg/ml dexamethasome, Moist heat, traction, Ultrasound, gait training, Therapeutic exercise, balance training, neuromuscular re-education, patient/family education, prosthetic training, manual techniques, passive ROM, dry needling, taping, vasopnuematic device, vestibular, spinal manipulations, joint manipulations  PLAN FOR NEXT SESSION: Assess HEP/update PRN, continue to progress functional mobility, strengthen prox hip muscles. Decrease patients pain. Nustep, floor recovery.  Anderson Malta B. Yoselyn Mcglade, PT 03/31/22 10:36 AM  [facility] [address], [place],  [address] Phone # [phone] Fax [phone]

## 2022-04-08 ENCOUNTER — Other Ambulatory Visit: Payer: Self-pay | Admitting: Family Medicine

## 2022-04-08 ENCOUNTER — Ambulatory Visit: Payer: Medicare HMO | Attending: Family Medicine | Admitting: Rehabilitative and Restorative Service Providers"

## 2022-04-08 ENCOUNTER — Encounter: Payer: Self-pay | Admitting: Rehabilitative and Restorative Service Providers"

## 2022-04-08 DIAGNOSIS — M5459 Other low back pain: Secondary | ICD-10-CM | POA: Insufficient documentation

## 2022-04-08 DIAGNOSIS — M6281 Muscle weakness (generalized): Secondary | ICD-10-CM | POA: Diagnosis present

## 2022-04-08 DIAGNOSIS — R252 Cramp and spasm: Secondary | ICD-10-CM | POA: Diagnosis present

## 2022-04-08 DIAGNOSIS — R2689 Other abnormalities of gait and mobility: Secondary | ICD-10-CM | POA: Insufficient documentation

## 2022-04-08 DIAGNOSIS — M25552 Pain in left hip: Secondary | ICD-10-CM | POA: Diagnosis present

## 2022-04-08 NOTE — Therapy (Signed)
OUTPATIENT PHYSICAL THERAPY TREATMENT NOTE   Patient Name: Karen Bell MRN: [id number] DOB:29-Aug-1945, 77 y.o., female Today's Date: 04/08/2022  END OF SESSION:  PT End of Session - 04/08/22 1104     Visit Number 4    Date for PT Re-Evaluation 05/20/22    Authorization Type [employer] MEDICARE HMO/PPO    Progress Note Due on Visit 10    PT Start Time 1100    PT Stop Time 1140    PT Time Calculation (min) 40 min    Activity Tolerance Patient tolerated treatment well    Behavior During Therapy [facility] for tasks assessed/performed             Past Medical History:  Diagnosis Date   Acquired hammertoes of both feet    Anterior subluxation of right shoulder 07/2017   Relocated by Dr. Tamala Julian   Cervical disc disorder with radiculopathy    "   "       "      "   Chronic renal insufficiency, stage 3 (moderate)    GFR 50s (avg sCr around 1.0)   Colon cancer screening    Colonoscopy normal 2008.  Cologuard NEG 06/2016.  Repeat 12/2019 NEG. Consider rpt 2024.   Diverticulosis    Duodenal diverticulum 11/2017   pancreatic cyst vs duodenal diverticulum partially obstructing ampulla??  Mild dilatation of CBD and main pancreatic duct.  Repeat MRI 02/2018 showed NORMAL pancreas and confirmed duodenal diverticulum.  CBD normal.   Family history of abdominal aortic aneurysm    pt has had AAA screening x 2, most recent was 02/2015.   Fracture of navicular bone of right wrist 2022   [facility]   Hepatic steatosis    mild (MRI abd 02/2018)   History of migraine    These resolved after menopause   HTN (hypertension)    + hypertensive retinopathy   Hyperlipidemia, mixed    Insomnia    maintenance problems; trazodone no help   Intractable hiccups 10/2017   resolved with PPI and reglan   Left rotator cuff tear arthropathy 04/2015   Dr. Charlann Boxer   Nephrolithiasis 1994   Osteoarthritis    hips, mild on radiographs 03/2022   Osteopenia 08/2018   DEXA 08/2018 T-score -1.2.  Rpt 04/2021 T score  -0.9. Rpt 2-3 yrs.   Renal cysts, acquired, bilateral 11/12/2017   R kidney with 2.4x 2.4x 2.4cm complex cyst-->MR abd-->Bosniak 1 and 2, no worrisome renal lesion.  Stable on MRI abd 02/2018.   Retinal tear of right eye    Laser retinopexy 9/1 and 9/8, 2020.   Past Surgical History:  Procedure Laterality Date   Aortic ultrasound  02/13/13; 02/2015   no aneurism (screening for high risk)   BUNIONECTOMY  2006   bilat   carotid dopplers (screening for PAD)  11/16/2008   "minor left carotid dz" per old records.  No hx of TIA/CVA.   CESAREAN SECTION  x 2    [place]  07/2006   recall 10 yrs.  'tics.  (Dr. Domenica Fail in [place], [place])   Bent     right index finger, base of fingernail   DEXA  08/2018   2020 T-score -1.2.  04/2021 T score -0.9.  Rpt 2-3 yrs   kidney stone extraction  1992   Cystoscopy: fragmented ureteral stone.  No procedures for this since then.   laser retinopexy Right 9/1 and 9/8,2020.   Patient Active Problem List  Diagnosis Date Noted   Other fatigue 11/30/2017   Acquired subluxation of right shoulder 08/04/2017   Medicare annual wellness visit, subsequent 05/10/2015   Cervical disc disorder with radiculopathy of cervical region 05/01/2015   Left rotator cuff tear arthropathy 05/01/2015   Essential hypertension 11/09/2013   Hyperlipemia, mixed 11/09/2013   Insomnia due to anxiety and fear 11/09/2013    REFERRING PROVIDER: Tammi Sou, MD  REFERRING DIAG:  Left hip pain [M25.552], Trochanteric bursitis of left hip [M70.62]   THERAPY DIAG:  Pain in left hip  Muscle weakness (generalized)  Other abnormalities of gait and mobility  Other low back pain  Rationale for Evaluation and Treatment: Rehabilitation  ONSET DATE: November 2023  SUBJECTIVE:   SUBJECTIVE STATEMENT: Pt reports overall less pain, denies pain currently.  Patient reports overall feeling 60% better since initial evaluation.  Patient states that she has been  walking more.  PERTINENT HISTORY: HTN PAIN:  Are you having pain? Yes: NPRS scale: 0-7/10 Pain location: L hip from lateral hip into groin.  Pain description: Intermittent Grabbing sensation Aggravating factors: Stairs, walking uphill and getting out of a chair.  Relieving factors: ibuprofen, movement   PRECAUTIONS: None  WEIGHT BEARING RESTRICTIONS: No  FALLS:  Has patient fallen in last 6 months? No  LIVING ENVIRONMENT: Lives with: lives with their spouse Lives in: House/apartment Stairs: Yes: Internal: 13 steps; on right going up and External: 3 steps; on right going up and none Has following equipment at home: Single point cane, Walker - 2 wheeled, and shower chair  OCCUPATION: Retired   PLOF: Independent  PATIENT GOALS: Pt would like to reduce her pain with walking and transfers.    OBJECTIVE:   DIAGNOSTIC FINDINGS: Mild bilateral femoroacetabular osteoarthritis.   PATIENT SURVEYS:  Eval:  FOTO 59.3038%, 66% in 11 visits   COGNITION: Overall cognitive status: Within functional limits for tasks assessed     SENSATION: WFL  MUSCLE LENGTH: Hamstrings: Right 75% deg; Left 75% deg    PALPATION: No tenderness to bursa.   LOWER EXTREMITY ROM:  Active ROM Right eval Left eval  Hip flexion [facility]  Hip abduction [facility]  Hip internal rotation [facility]  Hip external rotation [facility]  Knee flexion [facility] WFL  Knee extension WFL WFL   (Blank rows = not tested)  LOWER EXTREMITY MMT:  MMT Right eval Left eval  Hip flexion 4- 4-  Hip abduction 4+ 4  Hip internal rotation 4+ 4+  Hip external rotation 5 5  Knee flexion 5 5  Knee extension 4+ 4+   (Blank rows = not tested)  LOWER EXTREMITY SPECIAL TESTS:  Hip special tests: Hip scouring test: positive   FUNCTIONAL TESTS:  03/31/2022 5 times sit to stand: 10.83 sec Timed up and go (TUG): 7.80 sec  04/08/2022: 3 minute walk test:  694 ft with reports of some "tightness" after  GAIT: Distance walked:  38ft Assistive device utilized: None Level of assistance: Complete Independence Comments: Decreased WB and stance time on L LE upon standing.    TODAY'S TREATMENT:  DATE: 04/08/2022 Nustep x 5 min level 5 (PT present to discuss progress and goals) Standing hamstring stretch at steps 2 x 30 sec Standing hip flexor/quad stretch using chair 2 x 30 sec Seated piriformis stretch 2x30 sec bilat Sit to/from stand holding 5# kettlebell x10 Seated core series with 5# kettlebell:  hip to hip, hip to alt shoulder.  X10 each 3 min walk test:  694 ft Seated 3 way blue pball rollout 5x10 sec each Side stepping in front of large mirror 3x10 ft bilat   DATE: 03/31/22 Nustep x 5 min level 5 (PT present to discuss progress and goals) Standing hamstring stretch at steps 3 x 30 sec Standing hip flexor/quad stretch 3 x 30 sec Completed 5 x sit to stand and TUG  IT band stretch 3 x 30 sec both Supine SLR x 20 0# left Side lying hip abduction x 20 with 0# left Prone hip extension x 20 with 0# left Side lying hip adduction x 20 with 0# left Seated piriformis stretch 2 x 30 sec both Ice x 10 min to left hip seated   DATE: 03/28/22 Nustep x 5 min level 5 (PT present to discuss progress and goals) Reviewed HEP Added IT band stretch 2 x 30 sec both Added seated piriformis stretch 2 x 30 sec both Added standing quad/hip flexor stretch 2 x 30 sec Ice x 10 min to left hip seated   DATE: Creating, reviewing, and completing below HEP    PATIENT EDUCATION:  Education details: Educated pt on anatomy and physiology of current symptoms, FOTO, diagnosis, prognosis, HEP,  and POC. Person educated: Patient Education method: Customer service manager Education comprehension: verbalized understanding and returned demonstration  HOME EXERCISE PROGRAM: Access Code: [email or web address] URL:  [email or web address] Date: 03/28/2022 Prepared by: Candyce Churn  Exercises - Supine Lower Trunk Rotation  - 1 x daily - 7 x weekly - 1 sets - 10 reps - Supine Single Knee to Chest Stretch  - 1 x daily - 7 x weekly - 1 sets - 2 reps - 30 hold - Supine Piriformis Stretch with Foot on Ground  - 1 x daily - 7 x weekly - 1 sets - 2 reps - 30 hold - Supine Bridge  - 1 x daily - 7 x weekly - 2 sets - 10 reps - Supine Posterior Pelvic Tilt  - 1 x daily - 7 x weekly - 2 sets - 10 reps - Supine ITB Stretch with Strap  - 1 x daily - 7 x weekly - 1 sets - 2 reps - 30 sec hold - Seated Hamstring Stretch  - 1 x daily - 7 x weekly - 1 sets - 2 reps - 30 hold - Seated Piriformis Stretch with Trunk Bend  - 1 x daily - 7 x weekly - 1 sets - 2 reps - 30 hold - Standing Quad Stretch with Table and Chair Support  - 1 x daily - 7 x weekly - 1 sets - 2 reps - 30 sec hold  ASSESSMENT:  CLINICAL IMPRESSION:  Karen Bell presents to skilled PT reporting that she can tell that she is feeling some better.  Patient able to progress with strengthening and obtained a baseline 3 minute walk test measurement.  Patient with difficulty with side stepping and maintaining proper positioning of her feet.  Patient continues to require skilled PT to progress towards goal related activities.   OBJECTIVE IMPAIRMENTS: decreased activity tolerance, difficulty walking, decreased balance, decreased endurance, decreased mobility, decreased ROM,  decreased strength, impaired flexibility, impaired UE/LE use, postural dysfunction, and pain.  ACTIVITY LIMITATIONS: bending, lifting, carry, locomotion, cleaning, community activity, driving, and or occupation  PERSONAL FACTORS: HTN are also affecting patient's functional outcome.  REHAB POTENTIAL: Good  CLINICAL DECISION MAKING: Stable/uncomplicated  EVALUATION COMPLEXITY: Low    GOALS: Short term PT Goals Target date: 04/08/2022  Pt will be I and compliant with  HEP. Baseline:  Goal status: MET Pt will decrease pain by 25% overall Baseline: Goal status: MET  Long term PT goals Target date: 05/20/2022  Pt will improve  hip/knee strength to at least 5-/5 MMT to improve functional strength Baseline: Goal status: New  Pt will improve FOTO to at least 66% functional to show improved function Baseline: Goal status: New  Pt will reduce pain by overall 50% overall with usual activity Baseline: Goal status: ONGOING  Pt will reduce pain to overall less than 2-3/10 with usual activity and work activity. Baseline: Goal status: New  Pt will be able to ambulate community distances at least 1000 ft WNL gait pattern without complaints Baseline: Goal status: New  Pt will be able to perform floor recovery without any complications.  Baseline: Goal status: New  Pt will be able to transfer out of a chair without complaints of stiffness and pain.  Baseline: Goal status: ONGOING   PLAN: PT FREQUENCY: 1-2 times per week   PT DURATION: 6-8 weeks  PLANNED INTERVENTIONS (unless contraindicated): aquatic PT, Canalith repositioning, cryotherapy, Electrical stimulation, Iontophoresis with 4 mg/ml dexamethasome, Moist heat, traction, Ultrasound, gait training, Therapeutic exercise, balance training, neuromuscular re-education, patient/family education, prosthetic training, manual techniques, passive ROM, dry needling, taping, vasopnuematic device, vestibular, spinal manipulations, joint manipulations  PLAN FOR NEXT SESSION: Assess HEP/update PRN, continue to progress functional mobility, strengthen prox hip muscles, Nustep, floor recovery.  Juel Burrow, PT 04/08/22 11:48 AM   [facility] [address] [place], [place] [address] Phone # [phone] Fax [phone]

## 2022-04-11 ENCOUNTER — Ambulatory Visit (HOSPITAL_BASED_OUTPATIENT_CLINIC_OR_DEPARTMENT_OTHER): Payer: Medicare HMO | Attending: Family Medicine | Admitting: Physical Therapy

## 2022-04-11 ENCOUNTER — Encounter (HOSPITAL_BASED_OUTPATIENT_CLINIC_OR_DEPARTMENT_OTHER): Payer: Self-pay | Admitting: Physical Therapy

## 2022-04-11 DIAGNOSIS — M25552 Pain in left hip: Secondary | ICD-10-CM

## 2022-04-11 DIAGNOSIS — R252 Cramp and spasm: Secondary | ICD-10-CM

## 2022-04-11 DIAGNOSIS — R2689 Other abnormalities of gait and mobility: Secondary | ICD-10-CM

## 2022-04-11 DIAGNOSIS — M5459 Other low back pain: Secondary | ICD-10-CM

## 2022-04-11 DIAGNOSIS — M6281 Muscle weakness (generalized): Secondary | ICD-10-CM

## 2022-04-11 NOTE — Therapy (Signed)
OUTPATIENT PHYSICAL THERAPY TREATMENT NOTE   Patient Name: Karen Bell MRN: [id number] DOB:May 31, 1945, 77 y.o., female Today's Date: 04/11/2022  END OF SESSION:  PT End of Session - 04/11/22 1045     Visit Number 5    Number of Visits 12    Date for PT Re-Evaluation 05/20/22    Authorization Type [employer] MEDICARE HMO/PPO    Progress Note Due on Visit 10    PT Start Time 1015    PT Stop Time 1055    PT Time Calculation (min) 40 min    Activity Tolerance Patient tolerated treatment well    Behavior During Therapy [facility] for tasks assessed/performed              Past Medical History:  Diagnosis Date   Acquired hammertoes of both feet    Anterior subluxation of right shoulder 07/2017   Relocated by Dr. Katrinka Blazing   Cervical disc disorder with radiculopathy    "   "       "      "   Chronic renal insufficiency, stage 3 (moderate)    GFR 50s (avg sCr around 1.0)   Colon cancer screening    Colonoscopy normal 2008.  Cologuard NEG 06/2016.  Repeat 12/2019 NEG. Consider rpt 2024.   Diverticulosis    Duodenal diverticulum 11/2017   pancreatic cyst vs duodenal diverticulum partially obstructing ampulla??  Mild dilatation of CBD and main pancreatic duct.  Repeat MRI 02/2018 showed NORMAL pancreas and confirmed duodenal diverticulum.  CBD normal.   Family history of abdominal aortic aneurysm    pt has had AAA screening x 2, most recent was 02/2015.   Fracture of navicular bone of right wrist 2022   [facility]   Hepatic steatosis    mild (MRI abd 02/2018)   History of migraine    These resolved after menopause   HTN (hypertension)    + hypertensive retinopathy   Hyperlipidemia, mixed    Insomnia    maintenance problems; trazodone no help   Intractable hiccups 10/2017   resolved with PPI and reglan   Left rotator cuff tear arthropathy 04/2015   Dr. Terrilee Files   Nephrolithiasis 1994   Osteoarthritis    hips, mild on radiographs 03/2022   Osteopenia 08/2018   DEXA 08/2018 T-score  -1.2.  Rpt 04/2021 T score -0.9. Rpt 2-3 yrs.   Renal cysts, acquired, bilateral 11/12/2017   R kidney with 2.4x 2.4x 2.4cm complex cyst-->MR abd-->Bosniak 1 and 2, no worrisome renal lesion.  Stable on MRI abd 02/2018.   Retinal tear of right eye    Laser retinopexy 9/1 and 9/8, 2020.   Past Surgical History:  Procedure Laterality Date   Aortic ultrasound  02/13/13; 02/2015   no aneurism (screening for high risk)   BUNIONECTOMY  2006   bilat   carotid dopplers (screening for PAD)  11/16/2008   "minor left carotid dz" per old records.  No hx of TIA/CVA.   CESAREAN SECTION  x 2    1997 & 1999   COLONOSCOPY  07/2006   recall 10 yrs.  'tics.  (Dr. Raphael Gibney in [place], [place])   CYST EXCISION     right index finger, base of fingernail   DEXA  08/2018   2020 T-score -1.2.  04/2021 T score -0.9.  Rpt 2-3 yrs   kidney stone extraction  1992   Cystoscopy: fragmented ureteral stone.  No procedures for this since then.   laser retinopexy Right 9/1 and  9/8,2020.   Patient Active Problem List   Diagnosis Date Noted   Other fatigue 11/30/2017   Acquired subluxation of right shoulder 08/04/2017   Medicare annual wellness visit, subsequent 05/10/2015   Cervical disc disorder with radiculopathy of cervical region 05/01/2015   Left rotator cuff tear arthropathy 05/01/2015   Essential hypertension 11/09/2013   Hyperlipemia, mixed 11/09/2013   Insomnia due to anxiety and fear 11/09/2013    REFERRING PROVIDER: Jeoffrey MassedMcGowen, Philip H, MD  REFERRING DIAG:  Left hip pain [M25.552], Trochanteric bursitis of left hip [M70.62]   THERAPY DIAG:  Pain in left hip  Other abnormalities of gait and mobility  Muscle weakness (generalized)  Other low back pain  Cramp and spasm  Rationale for Evaluation and Treatment: Rehabilitation  ONSET DATE: November 2023  SUBJECTIVE:   SUBJECTIVE STATEMENT: Pt presents with more pain today. She thinks the side stepping last session or performing the IT band stretch may  have caused some irritation. She continues to report overall improvements.   PERTINENT HISTORY: HTN PAIN:  Are you having pain? Yes: NPRS scale: 0-7/10 Pain location: L hip from lateral hip into groin.  Pain description: Intermittent Grabbing sensation Aggravating factors: Stairs, walking uphill and getting out of a chair.  Relieving factors: ibuprofen, movement   PRECAUTIONS: None  WEIGHT BEARING RESTRICTIONS: No  FALLS:  Has patient fallen in last 6 months? No  LIVING ENVIRONMENT: Lives with: lives with their spouse Lives in: House/apartment Stairs: Yes: Internal: 13 steps; on right going up and External: 3 steps; on right going up and none Has following equipment at home: Single point cane, Walker - 2 wheeled, and shower chair  OCCUPATION: Retired   PLOF: Independent  PATIENT GOALS: Pt would like to reduce her pain with walking and transfers.    OBJECTIVE:   DIAGNOSTIC FINDINGS: Mild bilateral femoroacetabular osteoarthritis.   PATIENT SURVEYS:  Eval:  FOTO 59.3038%, 66% in 11 visits   COGNITION: Overall cognitive status: Within functional limits for tasks assessed     SENSATION: WFL  MUSCLE LENGTH: Hamstrings: Right 75% deg; Left 75% deg    PALPATION: No tenderness to bursa.   LOWER EXTREMITY ROM:  Active ROM Right eval Left eval  [facility]  Hip abduction [facility]  Hip internal rotation [facility]  Hip external rotation [facility]  Knee flexion [facility] WFL  Knee extension WFL WFL   (Blank rows = not tested)  LOWER EXTREMITY MMT:  MMT Right eval Left eval  Hip flexion 4- 4-  Hip abduction 4+ 4  Hip internal rotation 4+ 4+  Hip external rotation 5 5  Knee flexion 5 5  Knee extension 4+ 4+   (Blank rows = not tested)  LOWER EXTREMITY SPECIAL TESTS:  Hip special tests: Hip scouring test: positive   FUNCTIONAL TESTS:  03/31/2022 5 times sit to stand: 10.83 sec Timed up and go (TUG): 7.80 sec  04/08/2022: 3 minute walk test:  694 ft  with reports of some "tightness" after  GAIT: Distance walked: 2550ft Assistive device utilized: None Level of assistance: Complete Independence Comments: Decreased WB and stance time on L LE upon standing.    TODAY'S TREATMENT:              DATE: 04/11/2022 Nustep x 5 min level 5 (PT present to discuss progress and goals) Seated hamstring stretch 2 x 30 sec Supine piriformis stretch 2x30 sec bilat Supine SLR 2x10, bilat  Sideling hip abduction 2x10, bilat  Sit to/from stand  holding 3# dumbbell at chest height 2x10 Seated core series with 3# dumbbell hip to hip, hip to alt shoulder.  X10 each Seated clams with green theraband 2 x10          Seated marches with green theraband 2x20          DATE: 04/08/2022 Nustep x 5 min level 5 (PT present to discuss progress and goals) Standing hamstring stretch at steps 2 x 30 sec Standing hip flexor/quad stretch using chair 2 x 30 sec Seated piriformis stretch 2x30 sec bilat Sit to/from stand holding 5# kettlebell x10 Seated core series with 5# kettlebell:  hip to hip, hip to alt shoulder.  X10 each 3 min walk test:  694 ft Seated 3 way blue pball rollout 5x10 sec each Side stepping in front of large mirror 3x10 ft bilat   DATE: 03/31/22 Nustep x 5 min level 5 (PT present to discuss progress and goals) Standing hamstring stretch at steps 3 x 30 sec Standing hip flexor/quad stretch 3 x 30 sec Completed 5 x sit to stand and TUG  IT band stretch 3 x 30 sec both Supine SLR x 20 0# left Side lying hip abduction x 20 with 0# left Prone hip extension x 20 with 0# left Side lying hip adduction x 20 with 0# left Seated piriformis stretch 2 x 30 sec both Ice x 10 min to left hip seated   DATE: 03/28/22 Nustep x 5 min level 5 (PT present to discuss progress and goals) Reviewed HEP Added IT band stretch 2 x 30 sec both Added seated piriformis stretch 2 x 30 sec both Added standing quad/hip flexor stretch 2 x 30 sec Ice x 10 min to left hip seated    DATE: Creating, reviewing, and completing below HEP    PATIENT EDUCATION:  Education details: Educated pt on anatomy and physiology of current symptoms, FOTO, diagnosis, prognosis, HEP,  and POC. Person educated: Patient Education method: Medical illustratorxplanation and Demonstration Education comprehension: verbalized understanding and returned demonstration  HOME EXERCISE PROGRAM: Access Code: [email or web address] URL: [email or web address] Date: 03/28/2022 Prepared by: Mikey KirschnerJennifer Fields  Exercises - Supine Lower Trunk Rotation  - 1 x daily - 7 x weekly - 1 sets - 10 reps - Supine Single Knee to Chest Stretch  - 1 x daily - 7 x weekly - 1 sets - 2 reps - 30 hold - Supine Piriformis Stretch with Foot on Ground  - 1 x daily - 7 x weekly - 1 sets - 2 reps - 30 hold - Supine Bridge  - 1 x daily - 7 x weekly - 2 sets - 10 reps - Supine Posterior Pelvic Tilt  - 1 x daily - 7 x weekly - 2 sets - 10 reps - Supine ITB Stretch with Strap  - 1 x daily - 7 x weekly - 1 sets - 2 reps - 30 sec hold - Seated Hamstring Stretch  - 1 x daily - 7 x weekly - 1 sets - 2 reps - 30 hold - Seated Piriformis Stretch with Trunk Bend  - 1 x daily - 7 x weekly - 1 sets - 2 reps - 30 hold - Standing Quad Stretch with Table and Chair Support  - 1 x daily - 7 x weekly - 1 sets - 2 reps - 30 sec hold  ASSESSMENT:  CLINICAL IMPRESSION:  Karen Bell presents to skilled PT reporting that she had a flare up in her hip after stretches and side stepping. Patient able  to progress with strengthening in supine and sideling today. Pt requires cues for sit to stands due to excessive knee valgus. Increased fatigue noted at end of session today with pt winded. Patient continues to require skilled PT to progress towards goal related activities.   OBJECTIVE IMPAIRMENTS: decreased activity tolerance, difficulty walking, decreased balance, decreased endurance, decreased mobility, decreased ROM, decreased strength, impaired flexibility, impaired  UE/LE use, postural dysfunction, and pain.  ACTIVITY LIMITATIONS: bending, lifting, carry, locomotion, cleaning, community activity, driving, and or occupation  PERSONAL FACTORS: HTN are also affecting patient's functional outcome.  REHAB POTENTIAL: Good  CLINICAL DECISION MAKING: Stable/uncomplicated  EVALUATION COMPLEXITY: Low    GOALS: Short term PT Goals Target date: 04/08/2022  Pt will be I and compliant with HEP. Baseline:  Goal status: MET Pt will decrease pain by 25% overall Baseline: Goal status: MET  Long term PT goals Target date: 05/20/2022  Pt will improve  hip/knee strength to at least 5-/5 MMT to improve functional strength Baseline: Goal status: New  Pt will improve FOTO to at least 66% functional to show improved function Baseline: Goal status: New  Pt will reduce pain by overall 50% overall with usual activity Baseline: Goal status: ONGOING  Pt will reduce pain to overall less than 2-3/10 with usual activity and work activity. Baseline: Goal status: New  Pt will be able to ambulate community distances at least 1000 ft WNL gait pattern without complaints Baseline: Goal status: New  Pt will be able to perform floor recovery without any complications.  Baseline: Goal status: New  Pt will be able to transfer out of a chair without complaints of stiffness and pain.  Baseline: Goal status: ONGOING   PLAN: PT FREQUENCY: 1-2 times per week   PT DURATION: 6-8 weeks  PLANNED INTERVENTIONS (unless contraindicated): aquatic PT, Canalith repositioning, cryotherapy, Electrical stimulation, Iontophoresis with 4 mg/ml dexamethasome, Moist heat, traction, Ultrasound, gait training, Therapeutic exercise, balance training, neuromuscular re-education, patient/family education, prosthetic training, manual techniques, passive ROM, dry needling, taping, vasopnuematic device, vestibular, spinal manipulations, joint manipulations  PLAN FOR NEXT SESSION: Assess  HEP/update PRN, continue to progress functional mobility, strengthen prox hip muscles, Nustep, floor recovery.  Royal Hawthorn PT, DPT 04/11/22  10:58 AM

## 2022-04-14 ENCOUNTER — Encounter: Payer: Self-pay | Admitting: Physical Therapy

## 2022-04-14 ENCOUNTER — Ambulatory Visit: Payer: Medicare HMO | Admitting: Physical Therapy

## 2022-04-14 DIAGNOSIS — M6281 Muscle weakness (generalized): Secondary | ICD-10-CM

## 2022-04-14 DIAGNOSIS — M5459 Other low back pain: Secondary | ICD-10-CM

## 2022-04-14 DIAGNOSIS — M25552 Pain in left hip: Secondary | ICD-10-CM | POA: Diagnosis not present

## 2022-04-14 DIAGNOSIS — R252 Cramp and spasm: Secondary | ICD-10-CM

## 2022-04-14 DIAGNOSIS — R2689 Other abnormalities of gait and mobility: Secondary | ICD-10-CM

## 2022-04-14 NOTE — Therapy (Signed)
OUTPATIENT PHYSICAL THERAPY TREATMENT NOTE   Patient Name: Karen Bell MRN: [id number] DOB:1945-07-08, 77 y.o., female Today's Date: 04/14/2022  END OF SESSION:  PT End of Session - 04/14/22 1101     Visit Number 6    Number of Visits 12    Date for PT Re-Evaluation 05/20/22    Authorization Type [employer] MEDICARE HMO/PPO    Progress Note Due on Visit 10    PT Start Time 1015    PT Stop Time 1055    PT Time Calculation (min) 40 min    Activity Tolerance Patient tolerated treatment well    Behavior During Therapy [facility] for tasks assessed/performed               Past Medical History:  Diagnosis Date   Acquired hammertoes of both feet    Anterior subluxation of right shoulder 07/2017   Relocated by Dr. Katrinka Blazing   Cervical disc disorder with radiculopathy    "   "       "      "   Chronic renal insufficiency, stage 3 (moderate)    GFR 50s (avg sCr around 1.0)   Colon cancer screening    Colonoscopy normal 2008.  Cologuard NEG 06/2016.  Repeat 12/2019 NEG. Consider rpt 2024.   Diverticulosis    Duodenal diverticulum 11/2017   pancreatic cyst vs duodenal diverticulum partially obstructing ampulla??  Mild dilatation of CBD and main pancreatic duct.  Repeat MRI 02/2018 showed NORMAL pancreas and confirmed duodenal diverticulum.  CBD normal.   Family history of abdominal aortic aneurysm    pt has had AAA screening x 2, most recent was 02/2015.   Fracture of navicular bone of right wrist 2022   [facility]   Hepatic steatosis    mild (MRI abd 02/2018)   History of migraine    These resolved after menopause   HTN (hypertension)    + hypertensive retinopathy   Hyperlipidemia, mixed    Insomnia    maintenance problems; trazodone no help   Intractable hiccups 10/2017   resolved with PPI and reglan   Left rotator cuff tear arthropathy 04/2015   Dr. Terrilee Files   Nephrolithiasis 1994   Osteoarthritis    hips, mild on radiographs 03/2022   Osteopenia 08/2018   DEXA 08/2018 T-score  -1.2.  Rpt 04/2021 T score -0.9. Rpt 2-3 yrs.   Renal cysts, acquired, bilateral 11/12/2017   R kidney with 2.4x 2.4x 2.4cm complex cyst-->MR abd-->Bosniak 1 and 2, no worrisome renal lesion.  Stable on MRI abd 02/2018.   Retinal tear of right eye    Laser retinopexy 9/1 and 9/8, 2020.   Past Surgical History:  Procedure Laterality Date   Aortic ultrasound  02/13/13; 02/2015   no aneurism (screening for high risk)   BUNIONECTOMY  2006   bilat   carotid dopplers (screening for PAD)  11/16/2008   "minor left carotid dz" per old records.  No hx of TIA/CVA.   CESAREAN SECTION  x 2    1997 & 1999   COLONOSCOPY  07/2006   recall 10 yrs.  'tics.  (Dr. Raphael Gibney in [place], [place])   CYST EXCISION     right index finger, base of fingernail   DEXA  08/2018   2020 T-score -1.2.  04/2021 T score -0.9.  Rpt 2-3 yrs   kidney stone extraction  1992   Cystoscopy: fragmented ureteral stone.  No procedures for this since then.   laser retinopexy Right 9/1  and 9/8,2020.   Patient Active Problem List   Diagnosis Date Noted   Other fatigue 11/30/2017   Acquired subluxation of right shoulder 08/04/2017   Medicare annual wellness visit, subsequent 05/10/2015   Cervical disc disorder with radiculopathy of cervical region 05/01/2015   Left rotator cuff tear arthropathy 05/01/2015   Essential hypertension 11/09/2013   Hyperlipemia, mixed 11/09/2013   Insomnia due to anxiety and fear 11/09/2013    REFERRING PROVIDER: Jeoffrey Massed, MD  REFERRING DIAG:  Left hip pain [M25.552], Trochanteric bursitis of left hip [M70.62]   THERAPY DIAG:  Pain in left hip  Other abnormalities of gait and mobility  Muscle weakness (generalized)  Cramp and spasm  Other low back pain  Rationale for Evaluation and Treatment: Rehabilitation  ONSET DATE: November 2023  SUBJECTIVE:   SUBJECTIVE STATEMENT: Pt reports some irritation noted upon waking on Saturday morning, that subsided within a few minutes of being up.    PERTINENT HISTORY: HTN PAIN:  Are you having pain? Yes: NPRS scale: 0-7/10 Pain location: L hip from lateral hip into groin.  Pain description: Intermittent Grabbing sensation Aggravating factors: Stairs, walking uphill and getting out of a chair.  Relieving factors: ibuprofen, movement   PRECAUTIONS: None  WEIGHT BEARING RESTRICTIONS: No  FALLS:  Has patient fallen in last 6 months? No  LIVING ENVIRONMENT: Lives with: lives with their spouse Lives in: House/apartment Stairs: Yes: Internal: 13 steps; on right going up and External: 3 steps; on right going up and none Has following equipment at home: Single point cane, Walker - 2 wheeled, and shower chair  OCCUPATION: Retired   PLOF: Independent  PATIENT GOALS: Pt would like to reduce her pain with walking and transfers.    OBJECTIVE:   DIAGNOSTIC FINDINGS: Mild bilateral femoroacetabular osteoarthritis.   PATIENT SURVEYS:  Eval:  FOTO 59.3038%, 66% in 11 visits   COGNITION: Overall cognitive status: Within functional limits for tasks assessed     SENSATION: WFL  MUSCLE LENGTH: Hamstrings: Right 75% deg; Left 75% deg    PALPATION: No tenderness to bursa.   LOWER EXTREMITY ROM:  Active ROM Right eval Left eval  Hip flexion [facility]  Hip abduction [facility]  Hip internal rotation [facility]  Hip external rotation [facility]  Knee flexion [facility] WFL  Knee extension WFL WFL   (Blank rows = not tested)  LOWER EXTREMITY MMT:  MMT Right eval Left eval  Hip flexion 4- 4-  Hip abduction 4+ 4  Hip internal rotation 4+ 4+  Hip external rotation 5 5  Knee flexion 5 5  Knee extension 4+ 4+   (Blank rows = not tested)  LOWER EXTREMITY SPECIAL TESTS:  Hip special tests: Hip scouring test: positive   FUNCTIONAL TESTS:  03/31/2022 5 times sit to stand: 10.83 sec Timed up and go (TUG): 7.80 sec  04/08/2022: 3 minute walk test:  694 ft with reports of some "tightness" after  GAIT: Distance walked:  86ft Assistive device utilized: None Level of assistance: Complete Independence Comments: Decreased WB and stance time on L LE upon standing.    TODAY'S TREATMENT:             DATE: 04/14/2022 Nustep x 5 min level 5 (PT present to discuss progress and goals) Seated hamstring stretch 2 x 30 sec Supine piriformis stretch 2x30 sec bilat Supine IT band stretch 2x30 sec bilat Supine SLR 2x10, bilat 1.5# Sideling hip abduction 2x10, bilat 1.5# Sit to/from stand holding yellow weighted ball  at chest height with chest press 2x10 Seated core series with yellow weighted ball, hip to hip, hip to alt shoulder X10 each Seated clams with green theraband 2 x10          Leg press dbl 70#, 2x10  Leg press sl 40# 1x10, bilat   DATE: 04/11/2022 Nustep x 5 min level 5 (PT present to discuss progress and goals) Seated hamstring stretch 2 x 30 sec Supine piriformis stretch 2x30 sec bilat Supine SLR 2x10, bilat  Sideling hip abduction 2x10, bilat  Sit to/from stand holding 3# dumbbell at chest height 2x10 Seated core series with 3# dumbbell hip to hip, hip to alt shoulder.  X10 each Seated clams with green theraband 2 x10          Seated marches with green theraband 2x20          DATE: 04/08/2022 Nustep x 5 min level 5 (PT present to discuss progress and goals) Standing hamstring stretch at steps 2 x 30 sec Standing hip flexor/quad stretch using chair 2 x 30 sec Seated piriformis stretch 2x30 sec bilat Sit to/from stand holding 5# kettlebell x10 Seated core series with 5# kettlebell:  hip to hip, hip to alt shoulder.  X10 each 3 min walk test:  694 ft Seated 3 way blue pball rollout 5x10 sec each Side stepping in front of large mirror 3x10 ft bilat   DATE: 03/31/22 Nustep x 5 min level 5 (PT present to discuss progress and goals) Standing hamstring stretch at steps 3 x 30 sec Standing hip flexor/quad stretch 3 x 30 sec Completed 5 x sit to stand and TUG  IT band stretch 3 x 30 sec both Supine SLR  x 20 0# left Side lying hip abduction x 20 with 0# left Prone hip extension x 20 with 0# left Side lying hip adduction x 20 with 0# left Seated piriformis stretch 2 x 30 sec both Ice x 10 min to left hip seated   DATE: 03/28/22 Nustep x 5 min level 5 (PT present to discuss progress and goals) Reviewed HEP Added IT band stretch 2 x 30 sec both Added seated piriformis stretch 2 x 30 sec both Added standing quad/hip flexor stretch 2 x 30 sec Ice x 10 min to left hip seated   DATE: Creating, reviewing, and completing below HEP    PATIENT EDUCATION:  Education details: Educated pt on anatomy and physiology of current symptoms, FOTO, diagnosis, prognosis, HEP,  and POC. Person educated: Patient Education method: Medical illustrator Education comprehension: verbalized understanding and returned demonstration  HOME EXERCISE PROGRAM: Access Code: [email or web address] URL: [email or web address] Date: 03/28/2022 Prepared by: Mikey Kirschner  Exercises - Supine Lower Trunk Rotation  - 1 x daily - 7 x weekly - 1 sets - 10 reps - Supine Single Knee to Chest Stretch  - 1 x daily - 7 x weekly - 1 sets - 2 reps - 30 hold - Supine Piriformis Stretch with Foot on Ground  - 1 x daily - 7 x weekly - 1 sets - 2 reps - 30 hold - Supine Bridge  - 1 x daily - 7 x weekly - 2 sets - 10 reps - Supine Posterior Pelvic Tilt  - 1 x daily - 7 x weekly - 2 sets - 10 reps - Supine ITB Stretch with Strap  - 1 x daily - 7 x weekly - 1 sets - 2 reps - 30 sec hold - Seated Hamstring Stretch  - 1 x  daily - 7 x weekly - 1 sets - 2 reps - 30 hold - Seated Piriformis Stretch with Trunk Bend  - 1 x daily - 7 x weekly - 1 sets - 2 reps - 30 hold - Standing Quad Stretch with Table and Chair Support  - 1 x daily - 7 x weekly - 1 sets - 2 reps - 30 sec hold  ASSESSMENT:  CLINICAL IMPRESSION:  Chyrl CivatteJoann presents to skilled PT with no reports of pain. She was active with her HEP over the weekend and only noted  mild discomfort upon waking on Saturday morning. Patient able to progress with strengthening today with good tolerance. Pt reports mild irritation with second set of SLR and SL press on L LE only. Pt continues to require cues for sit to stands due to excessive knee valgus. Plan to progress to standing exercises next session as tolerated. Patient continues to require skilled PT to progress towards goal related activities.   OBJECTIVE IMPAIRMENTS: decreased activity tolerance, difficulty walking, decreased balance, decreased endurance, decreased mobility, decreased ROM, decreased strength, impaired flexibility, impaired UE/LE use, postural dysfunction, and pain.  ACTIVITY LIMITATIONS: bending, lifting, carry, locomotion, cleaning, community activity, driving, and or occupation  PERSONAL FACTORS: HTN are also affecting patient's functional outcome.  REHAB POTENTIAL: Good  CLINICAL DECISION MAKING: Stable/uncomplicated  EVALUATION COMPLEXITY: Low    GOALS: Short term PT Goals Target date: 04/08/2022  Pt will be I and compliant with HEP. Baseline:  Goal status: MET Pt will decrease pain by 25% overall Baseline: Goal status: MET  Long term PT goals Target date: 05/20/2022  Pt will improve  hip/knee strength to at least 5-/5 MMT to improve functional strength Baseline: Goal status: New  Pt will improve FOTO to at least 66% functional to show improved function Baseline: Goal status: New  Pt will reduce pain by overall 50% overall with usual activity Baseline: Goal status: ONGOING  Pt will reduce pain to overall less than 2-3/10 with usual activity and work activity. Baseline: Goal status: New  Pt will be able to ambulate community distances at least 1000 ft WNL gait pattern without complaints Baseline: Goal status: New  Pt will be able to perform floor recovery without any complications.  Baseline: Goal status: New  Pt will be able to transfer out of a chair without  complaints of stiffness and pain.  Baseline: Goal status: ONGOING   PLAN: PT FREQUENCY: 1-2 times per week   PT DURATION: 6-8 weeks  PLANNED INTERVENTIONS (unless contraindicated): aquatic PT, Canalith repositioning, cryotherapy, Electrical stimulation, Iontophoresis with 4 mg/ml dexamethasome, Moist heat, traction, Ultrasound, gait training, Therapeutic exercise, balance training, neuromuscular re-education, patient/family education, prosthetic training, manual techniques, passive ROM, dry needling, taping, vasopnuematic device, vestibular, spinal manipulations, joint manipulations  PLAN FOR NEXT SESSION: Assess HEP/update PRN, continue to progress functional mobility, strengthen prox hip muscles, Nustep, floor recovery.  Royal HawthornSimone Mallory Schaad PT, DPT 04/14/22  11:02 AM

## 2022-04-17 ENCOUNTER — Ambulatory Visit: Payer: Medicare HMO | Admitting: Physical Therapy

## 2022-04-17 ENCOUNTER — Encounter: Payer: Self-pay | Admitting: Physical Therapy

## 2022-04-17 DIAGNOSIS — R2689 Other abnormalities of gait and mobility: Secondary | ICD-10-CM

## 2022-04-17 DIAGNOSIS — R252 Cramp and spasm: Secondary | ICD-10-CM

## 2022-04-17 DIAGNOSIS — M25552 Pain in left hip: Secondary | ICD-10-CM | POA: Diagnosis not present

## 2022-04-17 DIAGNOSIS — M5459 Other low back pain: Secondary | ICD-10-CM

## 2022-04-17 DIAGNOSIS — M6281 Muscle weakness (generalized): Secondary | ICD-10-CM

## 2022-04-17 NOTE — Therapy (Signed)
OUTPATIENT PHYSICAL THERAPY TREATMENT NOTE   Patient Name: Karen Bell MRN: [id number] DOB:1945-02-11, 77 y.o., female Today's Date: 04/17/2022  END OF SESSION:  PT End of Session - 04/17/22 1016     Visit Number 7    Number of Visits 12    Date for PT Re-Evaluation 05/20/22    Authorization Type [employer] MEDICARE HMO/PPO    Progress Note Due on Visit 10    PT Start Time 0930    PT Stop Time 1011    PT Time Calculation (min) 41 min    Activity Tolerance Patient tolerated treatment well    Behavior During Therapy [facility] for tasks assessed/performed                Past Medical History:  Diagnosis Date   Acquired hammertoes of both feet    Anterior subluxation of right shoulder 07/2017   Relocated by Dr. Katrinka Blazing   Cervical disc disorder with radiculopathy    "   "       "      "   Chronic renal insufficiency, stage 3 (moderate)    GFR 50s (avg sCr around 1.0)   Colon cancer screening    Colonoscopy normal 2008.  Cologuard NEG 06/2016.  Repeat 12/2019 NEG. Consider rpt 2024.   Diverticulosis    Duodenal diverticulum 11/2017   pancreatic cyst vs duodenal diverticulum partially obstructing ampulla??  Mild dilatation of CBD and main pancreatic duct.  Repeat MRI 02/2018 showed NORMAL pancreas and confirmed duodenal diverticulum.  CBD normal.   Family history of abdominal aortic aneurysm    pt has had AAA screening x 2, most recent was 02/2015.   Fracture of navicular bone of right wrist 2022   [facility]   Hepatic steatosis    mild (MRI abd 02/2018)   History of migraine    These resolved after menopause   HTN (hypertension)    + hypertensive retinopathy   Hyperlipidemia, mixed    Insomnia    maintenance problems; trazodone no help   Intractable hiccups 10/2017   resolved with PPI and reglan   Left rotator cuff tear arthropathy 04/2015   Dr. Terrilee Files   Nephrolithiasis 1994   Osteoarthritis    hips, mild on radiographs 03/2022   Osteopenia 08/2018   DEXA 08/2018 T-score  -1.2.  Rpt 04/2021 T score -0.9. Rpt 2-3 yrs.   Renal cysts, acquired, bilateral 11/12/2017   R kidney with 2.4x 2.4x 2.4cm complex cyst-->MR abd-->Bosniak 1 and 2, no worrisome renal lesion.  Stable on MRI abd 02/2018.   Retinal tear of right eye    Laser retinopexy 9/1 and 9/8, 2020.   Past Surgical History:  Procedure Laterality Date   Aortic ultrasound  02/13/13; 02/2015   no aneurism (screening for high risk)   BUNIONECTOMY  2006   bilat   carotid dopplers (screening for PAD)  11/16/2008   "minor left carotid dz" per old records.  No hx of TIA/CVA.   CESAREAN SECTION  x 2    1997 & 1999   COLONOSCOPY  07/2006   recall 10 yrs.  'tics.  (Dr. Raphael Gibney in [place], [place])   CYST EXCISION     right index finger, base of fingernail   DEXA  08/2018   2020 T-score -1.2.  04/2021 T score -0.9.  Rpt 2-3 yrs   kidney stone extraction  1992   Cystoscopy: fragmented ureteral stone.  No procedures for this since then.   laser retinopexy Right  9/1 and 9/8,2020.   Patient Active Problem List   Diagnosis Date Noted   Other fatigue 11/30/2017   Acquired subluxation of right shoulder 08/04/2017   Medicare annual wellness visit, subsequent 05/10/2015   Cervical disc disorder with radiculopathy of cervical region 05/01/2015   Left rotator cuff tear arthropathy 05/01/2015   Essential hypertension 11/09/2013   Hyperlipemia, mixed 11/09/2013   Insomnia due to anxiety and fear 11/09/2013    REFERRING PROVIDER: Jeoffrey Massed, MD  REFERRING DIAG:  Left hip pain [M25.552], Trochanteric bursitis of left hip [M70.62]   THERAPY DIAG:  Pain in left hip  Other abnormalities of gait and mobility  Muscle weakness (generalized)  Cramp and spasm  Other low back pain  Rationale for Evaluation and Treatment: Rehabilitation  ONSET DATE: November 2023  SUBJECTIVE:   SUBJECTIVE STATEMENT: Hip was feeling great until I had to go up the stairs this morning. It has subsided since then, but then it  flared up again getting out of the car.  PERTINENT HISTORY: HTN PAIN:  Are you having pain? Yes: NPRS scale: 0-7/10 Pain location: L hip from lateral hip into groin.  Pain description: Intermittent Grabbing sensation Aggravating factors: Stairs, walking uphill and getting out of a chair.  Relieving factors: ibuprofen, movement   PRECAUTIONS: None  WEIGHT BEARING RESTRICTIONS: No  FALLS:  Has patient fallen in last 6 months? No  LIVING ENVIRONMENT: Lives with: lives with their spouse Lives in: House/apartment Stairs: Yes: Internal: 13 steps; on right going up and External: 3 steps; on right going up and none Has following equipment at home: Single point cane, Walker - 2 wheeled, and shower chair  OCCUPATION: Retired   PLOF: Independent  PATIENT GOALS: Pt would like to reduce her pain with walking and transfers.    OBJECTIVE:   DIAGNOSTIC FINDINGS: Mild bilateral femoroacetabular osteoarthritis.   PATIENT SURVEYS:  Eval:  FOTO 59.3038%, 66% in 11 visits   COGNITION: Overall cognitive status: Within functional limits for tasks assessed     SENSATION: WFL  MUSCLE LENGTH: Hamstrings: Right 75% deg; Left 75% deg    PALPATION: No tenderness to bursa.   LOWER EXTREMITY ROM:  Active ROM Right eval Left eval  Hip flexion [facility]  Hip abduction [facility]  Hip internal rotation [facility]  Hip external rotation [facility]  Knee flexion [facility] WFL  Knee extension WFL WFL   (Blank rows = not tested)  LOWER EXTREMITY MMT:  MMT Right eval Left eval  Hip flexion 4- 4-  Hip abduction 4+ 4  Hip internal rotation 4+ 4+  Hip external rotation 5 5  Knee flexion 5 5  Knee extension 4+ 4+   (Blank rows = not tested)  LOWER EXTREMITY SPECIAL TESTS:  Hip special tests: Hip scouring test: positive   FUNCTIONAL TESTS:  03/31/2022 5 times sit to stand: 10.83 sec Timed up and go (TUG): 7.80 sec  04/08/2022: 3 minute walk test:  694 ft with reports of some "tightness"  after  GAIT: Distance walked: 64ft Assistive device utilized: None Level of assistance: Complete Independence Comments: Decreased WB and stance time on L LE upon standing.    TODAY'S TREATMENT:        DATE: 04/17/2022 Nustep x 5 min level 5 (PT present to discuss progress and goals) Seated hamstring stretch 2 x 30 sec Seated piriformis stretch 2x30 sec bilat Supine IT band stretch 2x30 sec bilat Supine SLR 2x10, bilat 2# Sideling hip abduction 2x10, bilat 2# Sit  to/from stand with yellow loop around knees to encourage proper form with hip abduction. 2x10  Seated core series with yellow weighted ball, hip to hip, hip to alt shoulder X10 each Supine clams with yellow loop 2 x10          Ice to help modulate pain half way through PT session. Pt reports improvements after 3 min.   Addaday to L IT band and HS.   DATE: 04/14/2022 Nustep x 5 min level 5 (PT present to discuss progress and goals) Seated hamstring stretch 2 x 30 sec Supine piriformis stretch 2x30 sec bilat Supine IT band stretch 2x30 sec bilat Supine SLR 2x10, bilat 1.5# Sideling hip abduction 2x10, bilat 1.5# Sit to/from stand holding yellow weighted ball at chest height with chest press 2x10 Seated core series with yellow weighted ball, hip to hip, hip to alt shoulder X10 each Seated clams with green theraband 2 x10          Leg press dbl 70#, 2x10  Leg press sl 40# 1x10, bilat   DATE: 04/11/2022 Nustep x 5 min level 5 (PT present to discuss progress and goals) Seated hamstring stretch 2 x 30 sec Supine piriformis stretch 2x30 sec bilat Supine SLR 2x10, bilat  Sideling hip abduction 2x10, bilat  Sit to/from stand holding 3# dumbbell at chest height 2x10 Seated core series with 3# dumbbell hip to hip, hip to alt shoulder.  X10 each Seated clams with green theraband 2 x10          Seated marches with green theraband 2x20          PATIENT EDUCATION:  Education details: Educated pt on anatomy and physiology of current  symptoms, FOTO, diagnosis, prognosis, HEP,  and POC. Person educated: Patient Education method: Medical illustrator Education comprehension: verbalized understanding and returned demonstration  HOME EXERCISE PROGRAM: Access Code: [email or web address] URL: [email or web address] Date: 03/28/2022 Prepared by: Mikey Kirschner  Exercises - Supine Lower Trunk Rotation  - 1 x daily - 7 x weekly - 1 sets - 10 reps - Supine Single Knee to Chest Stretch  - 1 x daily - 7 x weekly - 1 sets - 2 reps - 30 hold - Supine Piriformis Stretch with Foot on Ground  - 1 x daily - 7 x weekly - 1 sets - 2 reps - 30 hold - Supine Bridge  - 1 x daily - 7 x weekly - 2 sets - 10 reps - Supine Posterior Pelvic Tilt  - 1 x daily - 7 x weekly - 2 sets - 10 reps - Supine ITB Stretch with Strap  - 1 x daily - 7 x weekly - 1 sets - 2 reps - 30 sec hold - Seated Hamstring Stretch  - 1 x daily - 7 x weekly - 1 sets - 2 reps - 30 hold - Seated Piriformis Stretch with Trunk Bend  - 1 x daily - 7 x weekly - 1 sets - 2 reps - 30 hold - Standing Quad Stretch with Table and Chair Support  - 1 x daily - 7 x weekly - 1 sets - 2 reps - 30 sec hold  ASSESSMENT:  CLINICAL IMPRESSION:  Sherry presents to skilled PT with reports of mild pain from negotiating the stairs this morning. Patient able to progress with strengthening today, but noted a flare up in her L gluteal tendon. Ice applied to help reduce pain with improvements noted. Addaday to L HS and IT band today. Improvements noted with sit  to stands today with resistance band, pt reporting increased fatigue in hip abductors following. Plan to progress to standing exercises next session as tolerated. Patient continues to require skilled PT to progress towards goal related activities.   OBJECTIVE IMPAIRMENTS: decreased activity tolerance, difficulty walking, decreased balance, decreased endurance, decreased mobility, decreased ROM, decreased strength, impaired  flexibility, impaired UE/LE use, postural dysfunction, and pain.  ACTIVITY LIMITATIONS: bending, lifting, carry, locomotion, cleaning, community activity, driving, and or occupation  PERSONAL FACTORS: HTN are also affecting patient's functional outcome.  REHAB POTENTIAL: Good  CLINICAL DECISION MAKING: Stable/uncomplicated  EVALUATION COMPLEXITY: Low    GOALS: Short term PT Goals Target date: 04/08/2022  Pt will be I and compliant with HEP. Baseline:  Goal status: MET Pt will decrease pain by 25% overall Baseline: Goal status: MET  Long term PT goals Target date: 05/20/2022  Pt will improve  hip/knee strength to at least 5-/5 MMT to improve functional strength Baseline: Goal status: New  Pt will improve FOTO to at least 66% functional to show improved function Baseline: Goal status: New  Pt will reduce pain by overall 50% overall with usual activity Baseline: Goal status: ONGOING  Pt will reduce pain to overall less than 2-3/10 with usual activity and work activity. Baseline: Goal status: New  Pt will be able to ambulate community distances at least 1000 ft WNL gait pattern without complaints Baseline: Goal status: New  Pt will be able to perform floor recovery without any complications.  Baseline: Goal status: New  Pt will be able to transfer out of a chair without complaints of stiffness and pain.  Baseline: Goal status: ONGOING   PLAN: PT FREQUENCY: 1-2 times per week   PT DURATION: 6-8 weeks  PLANNED INTERVENTIONS (unless contraindicated): aquatic PT, Canalith repositioning, cryotherapy, Electrical stimulation, Iontophoresis with 4 mg/ml dexamethasome, Moist heat, traction, Ultrasound, gait training, Therapeutic exercise, balance training, neuromuscular re-education, patient/family education, prosthetic training, manual techniques, passive ROM, dry needling, taping, vasopnuematic device, vestibular, spinal manipulations, joint manipulations  PLAN FOR  NEXT SESSION: Assess HEP/update PRN, continue to progress functional mobility, strengthen prox hip muscles, Nustep, floor recovery.  Royal HawthornSimone Dorn Hartshorne PT, DPT 04/17/22  10:17 AM

## 2022-04-21 ENCOUNTER — Ambulatory Visit: Payer: Medicare HMO | Admitting: Rehabilitative and Restorative Service Providers"

## 2022-04-21 ENCOUNTER — Encounter: Payer: Self-pay | Admitting: Rehabilitative and Restorative Service Providers"

## 2022-04-21 DIAGNOSIS — M5459 Other low back pain: Secondary | ICD-10-CM

## 2022-04-21 DIAGNOSIS — M25552 Pain in left hip: Secondary | ICD-10-CM

## 2022-04-21 DIAGNOSIS — R2689 Other abnormalities of gait and mobility: Secondary | ICD-10-CM

## 2022-04-21 DIAGNOSIS — M6281 Muscle weakness (generalized): Secondary | ICD-10-CM

## 2022-04-21 NOTE — Therapy (Signed)
OUTPATIENT PHYSICAL THERAPY TREATMENT NOTE   Patient Name: Karen Bell MRN: [id number] DOB:[birth year], 77 y.o., female Today's Date: 04/21/2022  END OF SESSION:  PT End of Session - 04/21/22 1016     Visit Number 8    Date for PT Re-Evaluation 05/20/22    Authorization Type [employer] MEDICARE HMO/PPO    Progress Note Due on Visit 10    PT Start Time 1015    PT Stop Time 1055    PT Time Calculation (min) 40 min    Activity Tolerance Patient tolerated treatment well    Behavior During Therapy [facility] for tasks assessed/performed                Past Medical History:  Diagnosis Date   Acquired hammertoes of both feet    Anterior subluxation of right shoulder 07/2017   Relocated by Dr. Katrinka Blazing   Cervical disc disorder with radiculopathy    "   "       "      "   Chronic renal insufficiency, stage 3 (moderate)    GFR 50s (avg sCr around 1.0)   Colon cancer screening    Colonoscopy normal 2008.  Cologuard NEG 06/2016.  Repeat 12/2019 NEG. Consider rpt 2024.   Diverticulosis    Duodenal diverticulum 11/2017   pancreatic cyst vs duodenal diverticulum partially obstructing ampulla??  Mild dilatation of CBD and main pancreatic duct.  Repeat MRI 02/2018 showed NORMAL pancreas and confirmed duodenal diverticulum.  CBD normal.   Family history of abdominal aortic aneurysm    pt has had AAA screening x 2, most recent was 02/2015.   Fracture of navicular bone of right wrist 2022   [facility]   Hepatic steatosis    mild (MRI abd 02/2018)   History of migraine    These resolved after menopause   HTN (hypertension)    + hypertensive retinopathy   Hyperlipidemia, mixed    Insomnia    maintenance problems; trazodone no help   Intractable hiccups 10/2017   resolved with PPI and reglan   Left rotator cuff tear arthropathy 04/2015   Dr. Terrilee Files   Nephrolithiasis 1994   Osteoarthritis    hips, mild on radiographs 03/2022   Osteopenia 08/2018   DEXA 08/2018 T-score -1.2.  Rpt 04/2021 T  score -0.9. Rpt 2-3 yrs.   Renal cysts, acquired, bilateral 11/12/2017   R kidney with 2.4x 2.4x 2.4cm complex cyst-->MR abd-->Bosniak 1 and 2, no worrisome renal lesion.  Stable on MRI abd 02/2018.   Retinal tear of right eye    Laser retinopexy 9/1 and 9/8, 2020.   Past Surgical History:  Procedure Laterality Date   Aortic ultrasound  02/13/13; 02/2015   no aneurism (screening for high risk)   BUNIONECTOMY  2006   bilat   carotid dopplers (screening for PAD)  11/16/2008   "minor left carotid dz" per old records.  No hx of TIA/CVA.   CESAREAN SECTION  x 2    1997 & 1999   COLONOSCOPY  07/2006   recall 10 yrs.  'tics.  (Dr. Raphael Gibney in [place], [place])   CYST EXCISION     right index finger, base of fingernail   DEXA  08/2018   2020 T-score -1.2.  04/2021 T score -0.9.  Rpt 2-3 yrs   kidney stone extraction  1992   Cystoscopy: fragmented ureteral stone.  No procedures for this since then.   laser retinopexy Right 9/1 and 9/8,2020.   Patient Active  Problem List   Diagnosis Date Noted   Other fatigue 11/30/2017   Acquired subluxation of right shoulder 08/04/2017   Medicare annual wellness visit, subsequent 05/10/2015   Cervical disc disorder with radiculopathy of cervical region 05/01/2015   Left rotator cuff tear arthropathy 05/01/2015   Essential hypertension 11/09/2013   Hyperlipemia, mixed 11/09/2013   Insomnia due to anxiety and fear 11/09/2013    REFERRING PROVIDER: Jeoffrey Massed, MD  REFERRING DIAG:  Left hip pain [M25.552], Trochanteric bursitis of left hip [M70.62]   THERAPY DIAG:  Pain in left hip  Other abnormalities of gait and mobility  Muscle weakness (generalized)  Other low back pain  Rationale for Evaluation and Treatment: Rehabilitation  ONSET DATE: November 2023  SUBJECTIVE:   SUBJECTIVE STATEMENT: Patient reports having some intermittent pain this morning.  PERTINENT HISTORY: HTN PAIN:  Are you having pain? Yes: NPRS scale: 3-4/10 Pain  location: L hip from lateral hip into groin.  Pain description: Intermittent Grabbing sensation Aggravating factors: Stairs, walking uphill and getting out of a chair.  Relieving factors: ibuprofen, movement   PRECAUTIONS: None  WEIGHT BEARING RESTRICTIONS: No  FALLS:  Has patient fallen in last 6 months? No  LIVING ENVIRONMENT: Lives with: lives with their spouse Lives in: House/apartment Stairs: Yes: Internal: 13 steps; on right going up and External: 3 steps; on right going up and none Has following equipment at home: Single point cane, Walker - 2 wheeled, and shower chair  OCCUPATION: Retired   PLOF: Independent  PATIENT GOALS: Pt would like to reduce her pain with walking and transfers.    OBJECTIVE:   DIAGNOSTIC FINDINGS: Mild bilateral femoroacetabular osteoarthritis.   PATIENT SURVEYS:  Eval:  FOTO 59.3038%, 66% in 11 visits  04/21/2022:  FOTO 77%  COGNITION: Overall cognitive status: Within functional limits for tasks assessed     SENSATION: WFL  MUSCLE LENGTH: Hamstrings: Right 75% deg; Left 75% deg    PALPATION: No tenderness to bursa.   LOWER EXTREMITY ROM:  Active ROM Right eval Left eval  Hip flexion [facility]  Hip abduction [facility]  Hip internal rotation [facility]  Hip external rotation [facility]  Knee flexion [facility] WFL  Knee extension WFL WFL   (Blank rows = not tested)  LOWER EXTREMITY MMT:  MMT Right eval Left eval  Hip flexion 4- 4-  Hip abduction 4+ 4  Hip internal rotation 4+ 4+  Hip external rotation 5 5  Knee flexion 5 5  Knee extension 4+ 4+   (Blank rows = not tested)  LOWER EXTREMITY SPECIAL TESTS:  Hip special tests: Hip scouring test: positive   FUNCTIONAL TESTS:  03/31/2022 5 times sit to stand: 10.83 sec Timed up and go (TUG): 7.80 sec  04/08/2022: 3 minute walk test:  694 ft with reports of some "tightness" after  GAIT: Distance walked: 84ft Assistive device utilized: None Level of assistance: Complete  Independence Comments: Decreased WB and stance time on L LE upon standing.    TODAY'S TREATMENT:         DATE: 04/21/2022 Nustep level 5 x6 min with PT present to discuss status Supine hamstring stretch with strap 2x20 sec bilat Supine IT band stretch with strap 2x20 sec bilat Supine piriformis stretch 2x20 sec bilat Supine straight leg raise with 2# 2x10 bilat Sidelying hip abduction with 2# 2x10 bilat Prone hip extension with 2# 2x10 bilat FOTO 77% Sit to/from stand with ball between knees to promote proper alignment x10 FWD and side  lunge onto bosu x10 bilat Tandem gait 4x10 ft Side stepping 2x10 ft bilat   DATE: 04/17/2022 Nustep x 5 min level 5 (PT present to discuss progress and goals) Seated hamstring stretch 2 x 30 sec Seated piriformis stretch 2x30 sec bilat Supine IT band stretch 2x30 sec bilat Supine SLR 2x10, bilat 2# Sideling hip abduction 2x10, bilat 2# Sit to/from stand with yellow loop around knees to encourage proper form with hip abduction. 2x10  Seated core series with yellow weighted ball, hip to hip, hip to alt shoulder X10 each Supine clams with yellow loop 2 x10          Ice to help modulate pain half way through PT session. Pt reports improvements after 3 min.   Addaday to L IT band and HS.   DATE: 04/14/2022 Nustep x 5 min level 5 (PT present to discuss progress and goals) Seated hamstring stretch 2 x 30 sec Supine piriformis stretch 2x30 sec bilat Supine IT band stretch 2x30 sec bilat Supine SLR 2x10, bilat 1.5# Sideling hip abduction 2x10, bilat 1.5# Sit to/from stand holding yellow weighted ball at chest height with chest press 2x10 Seated core series with yellow weighted ball, hip to hip, hip to alt shoulder X10 each Seated clams with green theraband 2 x10          Leg press dbl 70#, 2x10  Leg press sl 40# 1x10, bilat    PATIENT EDUCATION:  Education details: Educated pt on anatomy and physiology of current symptoms, FOTO, diagnosis,  prognosis, HEP,  and POC. Person educated: Patient Education method: Medical illustrator Education comprehension: verbalized understanding and returned demonstration  HOME EXERCISE PROGRAM: Access Code: [email or web address] URL: [email or web address] Date: 03/28/2022 Prepared by: Mikey Kirschner  Exercises - Supine Lower Trunk Rotation  - 1 x daily - 7 x weekly - 1 sets - 10 reps - Supine Single Knee to Chest Stretch  - 1 x daily - 7 x weekly - 1 sets - 2 reps - 30 hold - Supine Piriformis Stretch with Foot on Ground  - 1 x daily - 7 x weekly - 1 sets - 2 reps - 30 hold - Supine Bridge  - 1 x daily - 7 x weekly - 2 sets - 10 reps - Supine Posterior Pelvic Tilt  - 1 x daily - 7 x weekly - 2 sets - 10 reps - Supine ITB Stretch with Strap  - 1 x daily - 7 x weekly - 1 sets - 2 reps - 30 sec hold - Seated Hamstring Stretch  - 1 x daily - 7 x weekly - 1 sets - 2 reps - 30 hold - Seated Piriformis Stretch with Trunk Bend  - 1 x daily - 7 x weekly - 1 sets - 2 reps - 30 hold - Standing Quad Stretch with Table and Chair Support  - 1 x daily - 7 x weekly - 1 sets - 2 reps - 30 sec hold  ASSESSMENT:  CLINICAL IMPRESSION:  Karen Bell presents to skilled PT with reports of intermittent pain this morning.  Patient has progressed with increased score on FOTO and has now met that goal.  Patient able to progress with strengthening and balance exercises during session.  Patient requires minimal cuing during exercises for improved technique.  Spoke with patient about tapering down her visits in anticipation of discharge, pt verbalizes understanding.   OBJECTIVE IMPAIRMENTS: decreased activity tolerance, difficulty walking, decreased balance, decreased endurance, decreased mobility, decreased ROM, decreased strength, impaired flexibility,  impaired UE/LE use, postural dysfunction, and pain.  ACTIVITY LIMITATIONS: bending, lifting, carry, locomotion, cleaning, community activity, driving, and or  occupation  PERSONAL FACTORS: HTN are also affecting patient's functional outcome.  REHAB POTENTIAL: Good  CLINICAL DECISION MAKING: Stable/uncomplicated  EVALUATION COMPLEXITY: Low    GOALS: Short term PT Goals Target date: 04/08/2022  Pt will be I and compliant with HEP. Baseline:  Goal status: MET Pt will decrease pain by 25% overall Baseline: Goal status: MET  Long term PT goals Target date: 05/20/2022  Pt will improve  hip/knee strength to at least 5-/5 MMT to improve functional strength Baseline: Goal status: ONGOING  Pt will improve FOTO to at least 66% functional to show improved function Baseline: Goal status: MET on 04/21/2022  Pt will reduce pain by overall 50% overall with usual activity Baseline: Goal status: ONGOING  Pt will reduce pain to overall less than 2-3/10 with usual activity and work activity. Baseline: Goal status: ONGOING  Pt will be able to ambulate community distances at least 1000 ft WNL gait pattern without complaints Baseline: Goal status: New  Pt will be able to perform floor recovery without any complications.  Baseline: Goal status: New  Pt will be able to transfer out of a chair without complaints of stiffness and pain.  Baseline: Goal status: ONGOING   PLAN: PT FREQUENCY: 1-2 times per week   PT DURATION: 6-8 weeks  PLANNED INTERVENTIONS (unless contraindicated): aquatic PT, Canalith repositioning, cryotherapy, Electrical stimulation, Iontophoresis with 4 mg/ml dexamethasome, Moist heat, traction, Ultrasound, gait training, Therapeutic exercise, balance training, neuromuscular re-education, patient/family education, prosthetic training, manual techniques, passive ROM, dry needling, taping, vasopnuematic device, vestibular, spinal manipulations, joint manipulations  PLAN FOR NEXT SESSION: Assess HEP/update PRN, continue to progress functional mobility, strengthen prox hip muscles, Nustep, floor recovery.    Reather Laurence,  PT, DPT 04/21/22  11:01 AM  [facility] [address], [address] [place], [place] [address] Phone # [phone] Fax [phone]

## 2022-04-24 ENCOUNTER — Encounter: Payer: Self-pay | Admitting: Rehabilitative and Restorative Service Providers"

## 2022-04-24 ENCOUNTER — Ambulatory Visit: Payer: Medicare HMO | Admitting: Rehabilitative and Restorative Service Providers"

## 2022-04-24 DIAGNOSIS — M5459 Other low back pain: Secondary | ICD-10-CM

## 2022-04-24 DIAGNOSIS — M6281 Muscle weakness (generalized): Secondary | ICD-10-CM

## 2022-04-24 DIAGNOSIS — R2689 Other abnormalities of gait and mobility: Secondary | ICD-10-CM

## 2022-04-24 DIAGNOSIS — M25552 Pain in left hip: Secondary | ICD-10-CM

## 2022-04-24 NOTE — Therapy (Signed)
OUTPATIENT PHYSICAL THERAPY TREATMENT NOTE   Patient Name: Karen Bell MRN: [id number] DOB:May 08, 1945, 77 y.o., female Today's Date: 04/24/2022  END OF SESSION:  PT End of Session - 04/24/22 1014     Visit Number 9    Date for PT Re-Evaluation 05/20/22    Authorization Type [employer] MEDICARE HMO/PPO    Progress Note Due on Visit 10    PT Start Time 1010    PT Stop Time 1050    PT Time Calculation (min) 40 min    Activity Tolerance Patient tolerated treatment well    Behavior During Therapy [facility] for tasks assessed/performed                Past Medical History:  Diagnosis Date   Acquired hammertoes of both feet    Anterior subluxation of right shoulder 07/2017   Relocated by Dr. Katrinka Blazing   Cervical disc disorder with radiculopathy    "   "       "      "   Chronic renal insufficiency, stage 3 (moderate)    GFR 50s (avg sCr around 1.0)   Colon cancer screening    Colonoscopy normal 2008.  Cologuard NEG 06/2016.  Repeat 12/2019 NEG. Consider rpt 2024.   Diverticulosis    Duodenal diverticulum 11/2017   pancreatic cyst vs duodenal diverticulum partially obstructing ampulla??  Mild dilatation of CBD and main pancreatic duct.  Repeat MRI 02/2018 showed NORMAL pancreas and confirmed duodenal diverticulum.  CBD normal.   Family history of abdominal aortic aneurysm    pt has had AAA screening x 2, most recent was 02/2015.   Fracture of navicular bone of right wrist 2022   [facility]   Hepatic steatosis    mild (MRI abd 02/2018)   History of migraine    These resolved after menopause   HTN (hypertension)    + hypertensive retinopathy   Hyperlipidemia, mixed    Insomnia    maintenance problems; trazodone no help   Intractable hiccups 10/2017   resolved with PPI and reglan   Left rotator cuff tear arthropathy 04/2015   Dr. Terrilee Files   Nephrolithiasis 1994   Osteoarthritis    hips, mild on radiographs 03/2022   Osteopenia 08/2018   DEXA 08/2018 T-score -1.2.  Rpt 04/2021 T  score -0.9. Rpt 2-3 yrs.   Renal cysts, acquired, bilateral 11/12/2017   R kidney with 2.4x 2.4x 2.4cm complex cyst-->MR abd-->Bosniak 1 and 2, no worrisome renal lesion.  Stable on MRI abd 02/2018.   Retinal tear of right eye    Laser retinopexy 9/1 and 9/8, 2020.   Past Surgical History:  Procedure Laterality Date   Aortic ultrasound  02/13/13; 02/2015   no aneurism (screening for high risk)   BUNIONECTOMY  2006   bilat   carotid dopplers (screening for PAD)  11/16/2008   "minor left carotid dz" per old records.  No hx of TIA/CVA.   CESAREAN SECTION  x 2    1997 & 1999   COLONOSCOPY  07/2006   recall 10 yrs.  'tics.  (Dr. Raphael Gibney in [place], [place])   CYST EXCISION     right index finger, base of fingernail   DEXA  08/2018   2020 T-score -1.2.  04/2021 T score -0.9.  Rpt 2-3 yrs   kidney stone extraction  1992   Cystoscopy: fragmented ureteral stone.  No procedures for this since then.   laser retinopexy Right 9/1 and 9/8,2020.   Patient Active  Problem List   Diagnosis Date Noted   Other fatigue 11/30/2017   Acquired subluxation of right shoulder 08/04/2017   Medicare annual wellness visit, subsequent 05/10/2015   Cervical disc disorder with radiculopathy of cervical region 05/01/2015   Left rotator cuff tear arthropathy 05/01/2015   Essential hypertension 11/09/2013   Hyperlipemia, mixed 11/09/2013   Insomnia due to anxiety and fear 11/09/2013    REFERRING PROVIDER: Jeoffrey Massed, MD  REFERRING DIAG:  Left hip pain [M25.552], Trochanteric bursitis of left hip [M70.62]   THERAPY DIAG:  Pain in left hip  Other abnormalities of gait and mobility  Muscle weakness (generalized)  Other low back pain  Rationale for Evaluation and Treatment: Rehabilitation  ONSET DATE: November 2023  SUBJECTIVE:   SUBJECTIVE STATEMENT: Patient reports that she is feeling better this morning.  "It seems that the ice really helps"  PERTINENT HISTORY: HTN PAIN:  Are you having pain?  Yes: NPRS scale: 1-2/10 Pain location: L hip from lateral hip into groin.  Pain description: Intermittent Grabbing sensation Aggravating factors: Stairs, walking uphill and getting out of a chair.  Relieving factors: ibuprofen, movement   PRECAUTIONS: None  WEIGHT BEARING RESTRICTIONS: No  FALLS:  Has patient fallen in last 6 months? No  LIVING ENVIRONMENT: Lives with: lives with their spouse Lives in: House/apartment Stairs: Yes: Internal: 13 steps; on right going up and External: 3 steps; on right going up and none Has following equipment at home: Single point cane, Walker - 2 wheeled, and shower chair  OCCUPATION: Retired   PLOF: Independent  PATIENT GOALS: Pt would like to reduce her pain with walking and transfers.    OBJECTIVE:   DIAGNOSTIC FINDINGS: Mild bilateral femoroacetabular osteoarthritis.   PATIENT SURVEYS:  Eval:  FOTO 59.3038%, 66% in 11 visits  04/21/2022:  FOTO 77%  COGNITION: Overall cognitive status: Within functional limits for tasks assessed     SENSATION: WFL  MUSCLE LENGTH: Hamstrings: Right 75% deg; Left 75% deg    PALPATION: No tenderness to bursa.   LOWER EXTREMITY ROM:  Active ROM Right eval Left eval  Hip flexion [facility]  Hip abduction [facility]  Hip internal rotation [facility]  Hip external rotation [facility]  Knee flexion [facility] WFL  Knee extension WFL WFL   (Blank rows = not tested)  LOWER EXTREMITY MMT:  MMT Right eval Left eval  Hip flexion 4- 4-  Hip abduction 4+ 4  Hip internal rotation 4+ 4+  Hip external rotation 5 5  Knee flexion 5 5  Knee extension 4+ 4+   (Blank rows = not tested)  LOWER EXTREMITY SPECIAL TESTS:  Hip special tests: Hip scouring test: positive   FUNCTIONAL TESTS:  03/31/2022 5 times sit to stand: 10.83 sec Timed up and go (TUG): 7.80 sec  04/08/2022: 3 minute walk test:  694 ft with reports of some "tightness" after  GAIT: Distance walked: 1ft Assistive device utilized: None Level of  assistance: Complete Independence Comments: Decreased WB and stance time on L LE upon standing.    TODAY'S TREATMENT:         DATE: 04/24/2022 Nustep level 5 x6 min with PT present to discuss status Supine hamstring stretch with strap 2x20 sec bilat Supine IT band stretch with strap 2x20 sec bilat Supine straight leg raise with 3# 2x10 bilat Supine straight leg raise with 2# 2x10 bilat Sidelying hip abduction with 2# 2x10 bilat Prone hip extension with 2# 2x10 bilat Ambulation around both PT gyms with  3# on bilateral ankles Seated piriformis stretch 2x20 sec bilat   DATE: 04/21/2022 Nustep level 5 x6 min with PT present to discuss status Supine hamstring stretch with strap 2x20 sec bilat Supine IT band stretch with strap 2x20 sec bilat Supine piriformis stretch 2x20 sec bilat Supine straight leg raise with 2# 2x10 bilat Sidelying hip abduction with 2# 2x10 bilat Prone hip extension with 2# 2x10 bilat FOTO 77% Sit to/from stand with ball between knees to promote proper alignment x10 FWD and side lunge onto bosu x10 bilat Tandem gait 4x10 ft Side stepping 2x10 ft bilat   DATE: 04/17/2022 Nustep x 5 min level 5 (PT present to discuss progress and goals) Seated hamstring stretch 2 x 30 sec Seated piriformis stretch 2x30 sec bilat Supine IT band stretch 2x30 sec bilat Supine SLR 2x10, bilat 2# Sideling hip abduction 2x10, bilat 2# Sit to/from stand with yellow loop around knees to encourage proper form with hip abduction. 2x10  Seated core series with yellow weighted ball, hip to hip, hip to alt shoulder X10 each Supine clams with yellow loop 2 x10          Ice to help modulate pain half way through PT session. Pt reports improvements after 3 min.   Addaday to L IT band and HS.   DATE: 04/14/2022 Nustep x 5 min level 5 (PT present to discuss progress and goals) Seated hamstring stretch 2 x 30 sec Supine piriformis stretch 2x30 sec bilat Supine IT band stretch 2x30 sec  bilat Supine SLR 2x10, bilat 1.5# Sideling hip abduction 2x10, bilat 1.5# Sit to/from stand holding yellow weighted ball at chest height with chest press 2x10 Seated core series with yellow weighted ball, hip to hip, hip to alt shoulder X10 each Seated clams with green theraband 2 x10          Leg press dbl 70#, 2x10  Leg press sl 40# 1x10, bilat    PATIENT EDUCATION:  Education details: Educated pt on anatomy and physiology of current symptoms, FOTO, diagnosis, prognosis, HEP,  and POC. Person educated: Patient Education method: Medical illustrator Education comprehension: verbalized understanding and returned demonstration  HOME EXERCISE PROGRAM: Access Code: [email or web address] URL: [email or web address] Date: 04/24/2022 Prepared by: Clydie Braun Synethia Endicott  Exercises - Supine Lower Trunk Rotation  - 1 x daily - 7 x weekly - 1 sets - 10 reps - Supine Single Knee to Chest Stretch  - 1 x daily - 7 x weekly - 1 sets - 2 reps - 30 hold - Supine Piriformis Stretch with Foot on Ground  - 1 x daily - 7 x weekly - 1 sets - 2 reps - 30 hold - Supine Bridge  - 1 x daily - 7 x weekly - 2 sets - 10 reps - Supine Posterior Pelvic Tilt  - 1 x daily - 7 x weekly - 2 sets - 10 reps - Supine ITB Stretch with Strap  - 1 x daily - 7 x weekly - 1 sets - 2 reps - 30 sec hold - Supine Active Straight Leg Raise  - 1 x daily - 7 x weekly - 2 sets - 10 reps - Sidelying Hip Abduction  - 1 x daily - 7 x weekly - 2 sets - 10 reps - Prone Hip Extension  - 1 x daily - 7 x weekly - 2 sets - 10 reps - Weighted Ab Twist  - 1 x daily - 7 x weekly - 2 sets - 10 reps - Seated  Piriformis Stretch with Trunk Bend  - 1 x daily - 7 x weekly - 1 sets - 2 reps - 30 hold - Seated Hamstring Stretch  - 1 x daily - 7 x weekly - 1 sets - 2 reps - 30 hold - Standing Quad Stretch with Table and Chair Support  - 1 x daily - 7 x weekly - 1 sets - 2 reps - 30 sec hold - Tandem Walking with Counter Support  - 1 x daily - 7 x  weekly - 3 sets - 10 reps  ASSESSMENT:  CLINICAL IMPRESSION:  Camala presents to skilled PT with reports of improved pain noted.  Patient states that she feels that she is feeling better and feels like she is ready for discharge next week.  Patient with overall decreased pain and is ambulating increased distances more routinely.  Patient received updated HEP and will plan to reassess next visit and discharge if all goals met.   OBJECTIVE IMPAIRMENTS: decreased activity tolerance, difficulty walking, decreased balance, decreased endurance, decreased mobility, decreased ROM, decreased strength, impaired flexibility, impaired UE/LE use, postural dysfunction, and pain.  ACTIVITY LIMITATIONS: bending, lifting, carry, locomotion, cleaning, community activity, driving, and or occupation  PERSONAL FACTORS: HTN are also affecting patient's functional outcome.  REHAB POTENTIAL: Good  CLINICAL DECISION MAKING: Stable/uncomplicated  EVALUATION COMPLEXITY: Low    GOALS: Short term PT Goals Target date: 04/08/2022  Pt will be I and compliant with HEP. Baseline:  Goal status: MET Pt will decrease pain by 25% overall Baseline: Goal status: MET  Long term PT goals Target date: 05/20/2022  Pt will improve  hip/knee strength to at least 5-/5 MMT to improve functional strength Baseline: Goal status: ONGOING  Pt will improve FOTO to at least 66% functional to show improved function Baseline: Goal status: MET on 04/21/2022  Pt will reduce pain by overall 50% overall with usual activity Baseline: Goal status: MET on 04/24/2022  Pt will reduce pain to overall less than 2-3/10 with usual activity and work activity. Baseline: Goal status: MET on 04/24/2022  Pt will be able to ambulate community distances at least 1000 ft WNL gait pattern without complaints Baseline: Goal status: New  Pt will be able to perform floor recovery without any complications.  Baseline: Goal status: New  Pt will be  able to transfer out of a chair without complaints of stiffness and pain.  Baseline: Goal status: MET on 04/24/2022   PLAN: PT FREQUENCY: 1-2 times per week   PT DURATION: 6-8 weeks  PLANNED INTERVENTIONS (unless contraindicated): aquatic PT, Canalith repositioning, cryotherapy, Electrical stimulation, Iontophoresis with 4 mg/ml dexamethasome, Moist heat, traction, Ultrasound, gait training, Therapeutic exercise, balance training, neuromuscular re-education, patient/family education, prosthetic training, manual techniques, passive ROM, dry needling, taping, vasopnuematic device, vestibular, spinal manipulations, joint manipulations  PLAN FOR NEXT SESSION: Discharge if all goals met    Reather Laurence, PT, DPT 04/24/22  1:39 PM  [facility] [address], [address] [place], [place] [address] Phone # [phone] Fax [phone]

## 2022-04-29 ENCOUNTER — Ambulatory Visit: Payer: Medicare HMO | Admitting: Rehabilitative and Restorative Service Providers"

## 2022-04-29 ENCOUNTER — Encounter: Payer: Self-pay | Admitting: Physical Therapy

## 2022-04-29 ENCOUNTER — Ambulatory Visit: Payer: Medicare HMO | Admitting: Physical Therapy

## 2022-04-29 DIAGNOSIS — R2689 Other abnormalities of gait and mobility: Secondary | ICD-10-CM

## 2022-04-29 DIAGNOSIS — M25552 Pain in left hip: Secondary | ICD-10-CM

## 2022-04-29 DIAGNOSIS — M5459 Other low back pain: Secondary | ICD-10-CM

## 2022-04-29 DIAGNOSIS — M6281 Muscle weakness (generalized): Secondary | ICD-10-CM

## 2022-04-29 NOTE — Therapy (Addendum)
OUTPATIENT PHYSICAL THERAPY TREATMENT NOTE  Progress Note Reporting Period 03/25/22 to 04/29/22   See note below for Objective Data and Assessment of Progress/Goals.     Patient Name: Karen Bell MRN: [id number] DOB:1945/01/09, 77 y.o., female Today's Date: 04/29/2022  END OF SESSION:  PT End of Session - 04/29/22 1048     Visit Number 10    Date for PT Re-Evaluation 05/20/22    Authorization Type [employer] MEDICARE HMO/PPO    PT Start Time 1049    PT Stop Time 1130    PT Time Calculation (min) 41 min    Activity Tolerance Patient tolerated treatment well    Behavior During Therapy [facility] for tasks assessed/performed                 Past Medical History:  Diagnosis Date   Acquired hammertoes of both feet    Anterior subluxation of right shoulder 07/2017   Relocated by Dr. Katrinka Blazing   Cervical disc disorder with radiculopathy    "   "       "      "   Chronic renal insufficiency, stage 3 (moderate)    GFR 50s (avg sCr around 1.0)   Colon cancer screening    Colonoscopy normal 2008.  Cologuard NEG 06/2016.  Repeat 12/2019 NEG. Consider rpt 2024.   Diverticulosis    Duodenal diverticulum 11/2017   pancreatic cyst vs duodenal diverticulum partially obstructing ampulla??  Mild dilatation of CBD and main pancreatic duct.  Repeat MRI 02/2018 showed NORMAL pancreas and confirmed duodenal diverticulum.  CBD normal.   Family history of abdominal aortic aneurysm    pt has had AAA screening x 2, most recent was 02/2015.   Fracture of navicular bone of right wrist 2022   [facility]   Hepatic steatosis    mild (MRI abd 02/2018)   History of migraine    These resolved after menopause   HTN (hypertension)    + hypertensive retinopathy   Hyperlipidemia, mixed    Insomnia    maintenance problems; trazodone no help   Intractable hiccups 10/2017   resolved with PPI and reglan   Left rotator cuff tear arthropathy 04/2015   Dr. Terrilee Files   Nephrolithiasis 1994   Osteoarthritis     hips, mild on radiographs 03/2022   Osteopenia 08/2018   DEXA 08/2018 T-score -1.2.  Rpt 04/2021 T score -0.9. Rpt 2-3 yrs.   Renal cysts, acquired, bilateral 11/12/2017   R kidney with 2.4x 2.4x 2.4cm complex cyst-->MR abd-->Bosniak 1 and 2, no worrisome renal lesion.  Stable on MRI abd 02/2018.   Retinal tear of right eye    Laser retinopexy 9/1 and 9/8, 2020.   Past Surgical History:  Procedure Laterality Date   Aortic ultrasound  02/13/13; 02/2015   no aneurism (screening for high risk)   BUNIONECTOMY  2006   bilat   carotid dopplers (screening for PAD)  11/16/2008   "minor left carotid dz" per old records.  No hx of TIA/CVA.   CESAREAN SECTION  x 2    1997 & 1999   COLONOSCOPY  07/2006   recall 10 yrs.  'tics.  (Dr. Raphael Gibney in [place], [place])   CYST EXCISION     right index finger, base of fingernail   DEXA  08/2018   2020 T-score -1.2.  04/2021 T score -0.9.  Rpt 2-3 yrs   kidney stone extraction  1992   Cystoscopy: fragmented ureteral stone.  No procedures for this  since then.   laser retinopexy Right 9/1 and 9/8,2020.   Patient Active Problem List   Diagnosis Date Noted   Other fatigue 11/30/2017   Acquired subluxation of right shoulder 08/04/2017   Medicare annual wellness visit, subsequent 05/10/2015   Cervical disc disorder with radiculopathy of cervical region 05/01/2015   Left rotator cuff tear arthropathy 05/01/2015   Essential hypertension 11/09/2013   Hyperlipemia, mixed 11/09/2013   Insomnia due to anxiety and fear 11/09/2013    REFERRING PROVIDER: Jeoffrey Massed, MD  REFERRING DIAG:  Left hip pain [M25.552], Trochanteric bursitis of left hip [M70.62]   THERAPY DIAG:  Pain in left hip  Other abnormalities of gait and mobility  Muscle weakness (generalized)  Other low back pain  Rationale for Evaluation and Treatment: Rehabilitation  ONSET DATE: November 2023  SUBJECTIVE:   SUBJECTIVE STATEMENT: Pt states feeling better and feels like would do well  on her own.  Not having pain this morning  PERTINENT HISTORY: HTN PAIN:  Are you having pain? No  PRECAUTIONS: None  WEIGHT BEARING RESTRICTIONS: No  FALLS:  Has patient fallen in last 6 months? No  LIVING ENVIRONMENT: Lives with: lives with their spouse Lives in: House/apartment Stairs: Yes: Internal: 13 steps; on right going up and External: 3 steps; on right going up and none Has following equipment at home: Single point cane, Walker - 2 wheeled, and shower chair  OCCUPATION: Retired   PLOF: Independent  PATIENT GOALS: Pt would like to reduce her pain with walking and transfers.    OBJECTIVE:   DIAGNOSTIC FINDINGS: Mild bilateral femoroacetabular osteoarthritis.   PATIENT SURVEYS:  Eval:  FOTO 59.3038%, 66% in 11 visits  04/21/2022:  FOTO 77%  COGNITION: Overall cognitive status: Within functional limits for tasks assessed     SENSATION: WFL  MUSCLE LENGTH: Hamstrings: Right 75% deg; Left 75% deg    PALPATION: No tenderness to bursa.   LOWER EXTREMITY ROM:  Active ROM Right eval Left eval  Hip flexion [facility]  Hip abduction [facility]  Hip internal rotation [facility]  Hip external rotation [facility]  Knee flexion [facility] WFL  Knee extension WFL WFL   (Blank rows = not tested)  LOWER EXTREMITY MMT:  MMT Right eval Left eval  Hip flexion 4- 4-  Hip abduction 4+ 4  Hip internal rotation 4+ 4+  Hip external rotation 5 5  Knee flexion 5 5  Knee extension 4+ 4+   (Blank rows = not tested)  LOWER EXTREMITY SPECIAL TESTS:  Hip special tests: Hip scouring test: positive   FUNCTIONAL TESTS:  03/31/2022 5 times sit to stand: 10.83 sec Timed up and go (TUG): 7.80 sec  04/08/2022: 3 minute walk test:  694 ft with reports of some "tightness" after  GAIT: Distance walked: 56ft Assistive device utilized: None Level of assistance: Complete Independence Comments: Decreased WB and stance time on L LE upon standing.    TODAY'S TREATMENT:        DATE:  04/29/2022 Nustep level 5 x6 min with PT present to discuss status Supine hamstring stretch with strap 2x20 sec bilat Supine IT band stretch with strap 2x20 sec bilat Supine straight leg raise with 3# 2x10 bilat Sidelying hip abduction with 2# 2x10 bilat Prone hip extension with 2# 2x10 bilat Ambulation around both PT gyms timed for [address] ft - done in 6min45 sec Standing step over hurdle - 10x each Standing 2# ankle weight tapping cone - 10x each side Seated piriformis stretch  2x20 sec bilat H/s stretch on vibration plate 60 sec bil  DATE: 04/24/2022 Nustep level 5 x6 min with PT present to discuss status Supine hamstring stretch with strap 2x20 sec bilat Supine IT band stretch with strap 2x20 sec bilat Supine straight leg raise with 3# 2x10 bilat Supine straight leg raise with 2# 2x10 bilat Sidelying hip abduction with 2# 2x10 bilat Prone hip extension with 2# 2x10 bilat Ambulation around both PT gyms with 3# on bilateral ankles Seated piriformis stretch 2x20 sec bilat   DATE: 04/21/2022 Nustep level 5 x6 min with PT present to discuss status Supine hamstring stretch with strap 2x20 sec bilat Supine IT band stretch with strap 2x20 sec bilat Supine piriformis stretch 2x20 sec bilat Supine straight leg raise with 2# 2x10 bilat Sidelying hip abduction with 2# 2x10 bilat Prone hip extension with 2# 2x10 bilat FOTO 77% Sit to/from stand with ball between knees to promote proper alignment x10 FWD and side lunge onto bosu x10 bilat Tandem gait 4x10 ft Side stepping 2x10 ft bilat   DATE: 04/17/2022 Nustep x 5 min level 5 (PT present to discuss progress and goals) Seated hamstring stretch 2 x 30 sec Seated piriformis stretch 2x30 sec bilat Supine IT band stretch 2x30 sec bilat Supine SLR 2x10, bilat 2# Sideling hip abduction 2x10, bilat 2# Sit to/from stand with yellow loop around knees to encourage proper form with hip abduction. 2x10  Seated core series with yellow weighted  ball, hip to hip, hip to alt shoulder X10 each Supine clams with yellow loop 2 x10          Ice to help modulate pain half way through PT session. Pt reports improvements after 3 min.   Addaday to L IT band and HS.    PATIENT EDUCATION:  Education details: Educated pt on anatomy and physiology of current symptoms, FOTO, diagnosis, prognosis, HEP,  and POC. Person educated: Patient Education method: Medical illustrator Education comprehension: verbalized understanding and returned demonstration  HOME EXERCISE PROGRAM: Access Code: [email or web address] URL: [email or web address] Date: 04/24/2022 Prepared by: Clydie Braun Menke  Exercises - Supine Lower Trunk Rotation  - 1 x daily - 7 x weekly - 1 sets - 10 reps - Supine Single Knee to Chest Stretch  - 1 x daily - 7 x weekly - 1 sets - 2 reps - 30 hold - Supine Piriformis Stretch with Foot on Ground  - 1 x daily - 7 x weekly - 1 sets - 2 reps - 30 hold - Supine Bridge  - 1 x daily - 7 x weekly - 2 sets - 10 reps - Supine Posterior Pelvic Tilt  - 1 x daily - 7 x weekly - 2 sets - 10 reps - Supine ITB Stretch with Strap  - 1 x daily - 7 x weekly - 1 sets - 2 reps - 30 sec hold - Supine Active Straight Leg Raise  - 1 x daily - 7 x weekly - 2 sets - 10 reps - Sidelying Hip Abduction  - 1 x daily - 7 x weekly - 2 sets - 10 reps - Prone Hip Extension  - 1 x daily - 7 x weekly - 2 sets - 10 reps - Weighted Ab Twist  - 1 x daily - 7 x weekly - 2 sets - 10 reps - Seated Piriformis Stretch with Trunk Bend  - 1 x daily - 7 x weekly - 1 sets - 2 reps - 30 hold - Seated Hamstring Stretch  -  1 x daily - 7 x weekly - 1 sets - 2 reps - 30 hold - Standing Quad Stretch with Table and Chair Support  - 1 x daily - 7 x weekly - 1 sets - 2 reps - 30 sec hold - Tandem Walking with Counter Support  - 1 x daily - 7 x weekly - 3 sets - 10 reps  ASSESSMENT:  CLINICAL IMPRESSION:  Ch[place]avia presents to skilled PT with reports of improved pain noted.   Patient states that she feels ready for discharge next week.  Patient has all goals met as of today and is ind with    OBJECTIVE IMPAIRMENTS: decreased activity tolerance, difficulty walking, decreased balance, decreased endurance, decreased mobility, decreased ROM, decreased strength, impaired flexibility, impaired UE/LE use, postural dysfunction, and pain.  ACTIVITY LIMITATIONS: bending, lifting, carry, locomotion, cleaning, community activity, driving, and or occupation  PERSONAL FACTORS: HTN are also affecting patient's functional outcome.  REHAB POTENTIAL: Good  CLINICAL DECISION MAKING: Stable/uncomplicated  EVALUATION COMPLEXITY: Low    GOALS: Short term PT Goals Target date: 04/08/2022  Pt will be I and compliant with HEP. Baseline:  Goal status: MET Pt will decrease pain by 25% overall Baseline: Goal status: MET  Long term PT goals Target date: 05/20/2022  Pt will improve  hip/knee strength to at least 5-/5 MMT to improve functional strength Baseline: Goal status: ONGOING  Pt will improve FOTO to at least 66% functional to show improved function Baseline: Goal status: MET on 04/21/2022  Pt will reduce pain by overall 50% overall with usual activity Baseline: Goal status: MET on 04/24/2022  Pt will reduce pain to overall less than 2-3/10 with usual activity and work activity. Baseline: Goal status: MET on 04/24/2022  Pt will be able to ambulate community distances at least 1000 ft WNL gait pattern without complaints Baseline: 1000 ft in 4 min 45 sec Goal status: MET  Pt will be able to perform floor recovery without any complications.  Baseline: can get up and down from the floor doing exercises at home Goal status: MET  Pt will be able to transfer out of a chair without complaints of stiffness and pain.  Baseline: Goal status: MET on 04/24/2022   PLAN: PT FREQUENCY: 1-2 times per week   PT DURATION: 6-8 weeks  PLANNED INTERVENTIONS (unless  contraindicated): aquatic PT, Canalith repositioning, cryotherapy, Electrical stimulation, Iontophoresis with 4 mg/ml dexamethasome, Moist heat, traction, Ultrasound, gait training, Therapeutic exercise, balance training, neuromuscular re-education, patient/family education, prosthetic training, manual techniques, passive ROM, dry needling, taping, vasopnuematic device, vestibular, spinal manipulations, joint manipulations  PLAN FOR NEXT SESSION: Discharged today   Russella Dar, PT, DPT 04/29/22 11:29 AM   [facility] [address] [place], [place] [address] Phone # [phone] Fax [phone]  PHYSICAL THERAPY DISCHARGE SUMMARY  Visits from Start of Care: 10  Current functional level related to goals / functional outcomes: See above goals met   Remaining deficits: See above details   Education / Equipment: HEP   Patient agrees to discharge. Patient goals were met. Patient is being discharged due to meeting the stated rehab goals.  Russella Dar, PT, DPT 04/29/22 11:35 AM

## 2022-05-06 ENCOUNTER — Ambulatory Visit: Payer: Medicare HMO | Admitting: Rehabilitative and Restorative Service Providers"

## 2022-05-09 ENCOUNTER — Other Ambulatory Visit: Payer: Self-pay | Admitting: Family Medicine

## 2022-05-13 ENCOUNTER — Encounter: Payer: Medicare HMO | Admitting: Rehabilitative and Restorative Service Providers"

## 2022-05-20 ENCOUNTER — Encounter: Payer: Medicare HMO | Admitting: Rehabilitative and Restorative Service Providers"

## 2022-05-22 ENCOUNTER — Ambulatory Visit (INDEPENDENT_AMBULATORY_CARE_PROVIDER_SITE_OTHER): Payer: Medicare HMO | Admitting: Family Medicine

## 2022-05-22 ENCOUNTER — Encounter: Payer: Self-pay | Admitting: Family Medicine

## 2022-05-22 VITALS — BP 161/87 | HR 67 | Wt 144.0 lb

## 2022-05-22 DIAGNOSIS — N1831 Chronic kidney disease, stage 3a: Secondary | ICD-10-CM

## 2022-05-22 DIAGNOSIS — I10 Essential (primary) hypertension: Secondary | ICD-10-CM

## 2022-05-22 DIAGNOSIS — N3941 Urge incontinence: Secondary | ICD-10-CM

## 2022-05-22 DIAGNOSIS — E782 Mixed hyperlipidemia: Secondary | ICD-10-CM

## 2022-05-22 DIAGNOSIS — M25552 Pain in left hip: Secondary | ICD-10-CM | POA: Diagnosis not present

## 2022-05-22 LAB — COMPREHENSIVE METABOLIC PANEL
ALT: 21 U/L (ref 0–35)
AST: 24 U/L (ref 0–37)
Albumin: 4.5 g/dL (ref 3.5–5.2)
Alkaline Phosphatase: 40 U/L (ref 39–117)
BUN: 24 mg/dL — ABNORMAL HIGH (ref 6–23)
CO2: 30 mEq/L (ref 19–32)
Calcium: 10.4 mg/dL (ref 8.4–10.5)
Chloride: 102 mEq/L (ref 96–112)
Creatinine, Ser: 0.9 mg/dL (ref 0.40–1.20)
GFR: 62.01 mL/min (ref >60.00)
Glucose, Bld: 67 mg/dL — ABNORMAL LOW (ref 70–99)
Potassium: 3.9 mEq/L (ref 3.5–5.1)
Sodium: 142 mEq/L (ref 135–145)
Total Bilirubin: 0.4 mg/dL (ref 0.2–1.2)
Total Protein: 7.4 g/dL (ref 6.0–8.3)

## 2022-05-22 MED ORDER — HYDROCHLOROTHIAZIDE 25 MG PO TABS
25.0000 mg | ORAL_TABLET | Freq: Every day | ORAL | 3 refills | Status: DC
Start: 2022-05-22 — End: 2022-12-18

## 2022-05-22 MED ORDER — SIMVASTATIN 40 MG PO TABS: 40.0000 mg | ORAL_TABLET | Freq: Every day | ORAL | 3 refills | Status: DC

## 2022-05-22 NOTE — Progress Notes (Signed)
OFFICE VISIT  05/22/2022  CC:  Chief Complaint  Patient presents with   Medical Management of Chronic Issues    Patient is a 77 y.o. female who presents for 75-month follow-up hypertension, chronic renal insufficiency stage III, and hyperlipidemia. A/P as of last CPE on 11/21/2021: "#1 hypertension, well controlled on 12.5 mg HCTZ daily. Electrolytes and creatinine today.   2.  Mixed hyperlipidemia. Doing well on simvastatin 40 mg a day and fenofibrate 54 mg a day. Lipid panel and hepatic panel today."  INTERIM HX: Karen Bell feels well. Home blood pressures averaging around 140/80, heart rate averaging around 70.  She has progressively worsening urinary urgency, frequency, and nocturia over the last 1 year.  It is interrupting her sleep significantly.  Left hip: The symptoms from her trochanteric pain syndrome have improved status post PT.  She still does have some increased pain when she does a lot of her rehab exercises at home and increased pain when she walks uphill. Overall though she is pleased with the current state of things.  ROS as above, plus--> no fevers, no CP, no SOB, no wheezing, no cough, no dizziness, no HAs, no rashes, no melena/hematochezia.  No polyuria or polydipsia.  No myalgias or arthralgias.  No focal weakness, paresthesias, or tremors.  No acute vision or hearing abnormalities.  No dysuria or unusual/new urinary urgency or frequency.  No recent changes in lower legs. No n/v/d or abd pain.  No palpitations.     Past Medical History:  Diagnosis Date   Acquired hammertoes of both feet    Anterior subluxation of right shoulder 07/2017   Relocated by Dr. Katrinka Blazing   Cervical disc disorder with radiculopathy    "   "       "      "   Chronic renal insufficiency, stage 3 (moderate) (HCC)    GFR 50s (avg sCr around 1.0)   Colon cancer screening    Colonoscopy normal 2008.  Cologuard NEG 06/2016.  Repeat 12/2019 NEG. Consider rpt 2024.   Diverticulosis    Duodenal  diverticulum 11/2017   pancreatic cyst vs duodenal diverticulum partially obstructing ampulla??  Mild dilatation of CBD and main pancreatic duct.  Repeat MRI 02/2018 showed NORMAL pancreas and confirmed duodenal diverticulum.  CBD normal.   Family history of abdominal aortic aneurysm    pt has had AAA screening x 2, most recent was 02/2015.   Fracture of navicular bone of right wrist 2022   [facility]   Hepatic steatosis    mild (MRI abd 02/2018)   History of migraine    These resolved after menopause   HTN (hypertension)    + hypertensive retinopathy   Hyperlipidemia, mixed    Insomnia    maintenance problems; trazodone no help   Intractable hiccups 10/2017   resolved with PPI and reglan   Left rotator cuff tear arthropathy 04/2015   Dr. Terrilee Files   Nephrolithiasis 1994   Osteoarthritis    hips, mild on radiographs 03/2022   Osteopenia 08/2018   DEXA 08/2018 T-score -1.2.  Rpt 04/2021 T score -0.9. Rpt 2-3 yrs.   Renal cysts, acquired, bilateral 11/12/2017   R kidney with 2.4x 2.4x 2.4cm complex cyst-->MR abd-->Bosniak 1 and 2, no worrisome renal lesion.  Stable on MRI abd 02/2018.   Retinal tear of right eye    Laser retinopexy 9/1 and 9/8, 2020.    Past Surgical History:  Procedure Laterality Date   Aortic ultrasound  02/13/13; 02/2015  no aneurism (screening for high risk)   BUNIONECTOMY  2006   bilat   carotid dopplers (screening for PAD)  11/16/2008   "minor left carotid dz" per old records.  No hx of TIA/CVA.   CESAREAN SECTION  x 2    1997 & 1999   COLONOSCOPY  07/2006   recall 10 yrs.  'tics.  (Dr. Raphael Gibney in [place], [place])   CYST EXCISION     right index finger, base of fingernail   DEXA  08/2018   2020 T-score -1.2.  04/2021 T score -0.9.  Rpt 2-3 yrs   kidney stone extraction  1992   Cystoscopy: fragmented ureteral stone.  No procedures for this since then.   laser retinopexy Right 9/1 and 9/8,2020.    Outpatient Medications Prior to Visit  Medication Sig Dispense  Refill   calcium citrate-vitamin D (CITRACAL+D) 315-200 MG-UNIT per tablet Take 1 tablet by mouth daily.     Cholecalciferol (VITAMIN D) 2000 units tablet Take 2,000 Units by mouth daily.     fenofibrate 54 MG tablet Take 1 tablet (54 mg total) by mouth daily. 90 tablet 1   Multiple Vitamins-Minerals (LUTEIN-ZEAXANTHIN PO) Take 125 mg by mouth daily.     Multiple Vitamins-Minerals (MULTIVITAMIN ADULTS PO) Take by mouth daily.     Turmeric 500 MG CAPS Take by mouth.     hydrochlorothiazide (MICROZIDE) 12.5 MG capsule Take 1 capsule (12.5 mg total) by mouth daily. 90 capsule 1   simvastatin (ZOCOR) 40 MG tablet TAKE 1 TABLET BY MOUTH EVERY DAY 90 tablet 1   triamcinolone cream (KENALOG) 0.1 % APPLY SPARINGLY TO AFFECTED AREA TWICE A DAY (Patient not taking: Reported on 05/22/2022)     pantoprazole (PROTONIX) 40 MG tablet Take 1 tablet (40 mg total) by mouth daily. 30 tablet 0   No facility-administered medications prior to visit.    Allergies  Allergen Reactions   Metoclopramide Shortness Of Breath    SOB, inc HR, nausea, fatigue   Erythromycin Other (See Comments)    Abdominal pain   Tetracyclines & Related Other (See Comments)    Joint pain   Lisinopril Cough    Review of Systems As per HPI  PE:    05/22/2022    8:56 AM 03/26/2022    2:18 PM 03/26/2022    1:56 PM  Vitals with BMI  Height   5\' 5"   Weight 144 lbs  148 lbs 3 oz  BMI   24.66  Systolic 161 134 161  Diastolic 87 76 85  Pulse 67  67     Physical Exam  Gen: Alert, well appearing.  Patient is oriented to person, place, time, and situation. AFFECT: pleasant, lucid thought and speech. CV: RRR, no m/r/g.   LUNGS: CTA bilat, nonlabored resps, good aeration in all lung fields. EXT: no clubbing or cyanosis.  no edema.    LABS:  Last CBC Lab Results  Component Value Date   WBC 6.5 11/21/2021   HGB 14.1 11/21/2021   HCT 41.5 11/21/2021   MCV 88.3 11/21/2021   MCH 30.0 11/21/2021   RDW 12.7 11/21/2021   PLT  278 11/21/2021   Last metabolic panel Lab Results  Component Value Date   GLUCOSE 79 11/21/2021   NA 141 11/21/2021   K 4.5 11/21/2021   CL 102 11/21/2021   CO2 32 11/21/2021   BUN 24 11/21/2021   CREATININE 1.01 (H) 11/21/2021   CALCIUM 10.1 11/21/2021   PROT 7.3 11/21/2021   ALBUMIN  4.9 11/20/2020   BILITOT 0.4 11/21/2021   ALKPHOS 40 11/20/2020   AST 20 11/21/2021   ALT 20 11/21/2021   Last lipids Lab Results  Component Value Date   CHOL 148 11/21/2021   HDL 46 (L) 11/21/2021   LDLCALC 76 11/21/2021   TRIG 154 (H) 11/21/2021   CHOLHDL 3.2 11/21/2021   Last thyroid functions Lab Results  Component Value Date   TSH 2.32 10/26/2017   T3TOTAL 100.6 11/13/2014   Last vitamin B12 and Folate Lab Results  Component Value Date   VITAMINB12 852 10/26/2017   IMPRESSION AND PLAN:  #1 uncontrolled hypertension, increase HCTZ to 25 mg a day. Electrolytes and creatinine today.  2.  Chronic renal insufficiency stage IIIa. She avoids NSAIDs and hydrates well. Electrolytes and creatinine today.  3.  Left hip trochanteric pain syndrome. Improved/stable. Continue home rehab exercises as needed.  4.  Hypercholesterolemia, has been stable on simvastatin 40 mg daily long-term. Most recent LDL about 6 months ago was 76. Plan is to repeat lipid panel in 6 months.  #5 urge incontinence/overactive bladder. We discussed possible trial of antimuscarinic but she chose to hold off at this time.  An After Visit Summary was printed and given to the patient.  FOLLOW UP: Return for 3-4 wk f/u HTN. Next CPE 11/2022  Signed:  Santiago Bumpers, MD           05/22/2022

## 2022-06-06 ENCOUNTER — Other Ambulatory Visit: Payer: Self-pay | Admitting: Family Medicine

## 2022-06-17 NOTE — Patient Instructions (Signed)

## 2022-06-19 ENCOUNTER — Ambulatory Visit (INDEPENDENT_AMBULATORY_CARE_PROVIDER_SITE_OTHER): Payer: Medicare HMO | Admitting: Family Medicine

## 2022-06-19 ENCOUNTER — Encounter: Payer: Self-pay | Admitting: Family Medicine

## 2022-06-19 VITALS — BP 125/82 | HR 72 | Wt 143.4 lb

## 2022-06-19 DIAGNOSIS — I10 Essential (primary) hypertension: Secondary | ICD-10-CM | POA: Diagnosis not present

## 2022-06-19 DIAGNOSIS — N1831 Chronic kidney disease, stage 3a: Secondary | ICD-10-CM | POA: Diagnosis not present

## 2022-06-19 NOTE — Progress Notes (Signed)
OFFICE VISIT  06/19/2022  CC:  Chief Complaint  Patient presents with   Hypertension   Patient is a 77 y.o. female who presents for 3-week follow-up uncontrolled hypertension. A/P as of last visit: "#1 uncontrolled hypertension, increase HCTZ to 25 mg a day. Electrolytes and creatinine today.   2.  Chronic renal insufficiency stage IIIa. She avoids NSAIDs and hydrates well. Electrolytes and creatinine today.   3.  Left hip trochanteric pain syndrome. Improved/stable. Continue home rehab exercises as needed.   4.  Hypercholesterolemia, has been stable on simvastatin 40 mg daily long-term. Most recent LDL about 6 months ago was 76. Plan is to repeat lipid panel in 6 months.   #5 urge incontinence/overactive bladder. We discussed possible trial of antimuscarinic but she chose to hold off at this time."  INTERIM HX: Karen Bell is feeling well. Home blood pressures in the low 130s average over the 70s to low 80s.  Highest blood pressure was 150 systolic, next highest 140.  Fair amount of readings in the 120s and even a couple less than 120.  CMET normal last visit (GFR 62, K 3.9)).   Past Medical History:  Diagnosis Date   Acquired hammertoes of both feet    Anterior subluxation of right shoulder 07/2017   Relocated by Dr. Katrinka Blazing   Cervical disc disorder with radiculopathy    "   "       "      "   Chronic renal insufficiency, stage 3 (moderate) (HCC)    GFR 50s (avg sCr around 1.0)   Colon cancer screening    Colonoscopy normal 2008.  Cologuard NEG 06/2016.  Repeat 12/2019 NEG. Consider rpt 2024.   Diverticulosis    Duodenal diverticulum 11/2017   pancreatic cyst vs duodenal diverticulum partially obstructing ampulla??  Mild dilatation of CBD and main pancreatic duct.  Repeat MRI 02/2018 showed NORMAL pancreas and confirmed duodenal diverticulum.  CBD normal.   Family history of abdominal aortic aneurysm    pt has had AAA screening x 2, most recent was 02/2015.   Fracture of  navicular bone of right wrist 2022   [facility]   Greater trochanteric pain syndrome of left lower extremity    2024--PT helpful   Hepatic steatosis    mild (MRI abd 02/2018)   History of migraine    These resolved after menopause   HTN (hypertension)    + hypertensive retinopathy   Hyperlipidemia, mixed    Insomnia    maintenance problems; trazodone no help   Intractable hiccups 10/2017   resolved with PPI and reglan   Left rotator cuff tear arthropathy 04/2015   Dr. Terrilee Files   Nephrolithiasis 1994   OAB (overactive bladder)    Osteoarthritis    hips, mild on radiographs 03/2022   Osteopenia 08/2018   DEXA 08/2018 T-score -1.2.  Rpt 04/2021 T score -0.9. Rpt 2-3 yrs.   Renal cysts, acquired, bilateral 11/12/2017   R kidney with 2.4x 2.4x 2.4cm complex cyst-->MR abd-->Bosniak 1 and 2, no worrisome renal lesion.  Stable on MRI abd 02/2018.   Retinal tear of right eye    Laser retinopexy 9/1 and 9/8, 2020.    Past Surgical History:  Procedure Laterality Date   Aortic ultrasound  02/13/13; 02/2015   no aneurism (screening for high risk)   BUNIONECTOMY  2006   bilat   carotid dopplers (screening for PAD)  11/16/2008   "minor left carotid dz" per old records.  No hx of TIA/CVA.  CESAREAN SECTION  x 2    1997 & 1999   COLONOSCOPY  07/2006   recall 10 yrs.  'tics.  (Dr. Raphael Gibney in [place], [place])   CYST EXCISION     right index finger, base of fingernail   DEXA  08/2018   2020 T-score -1.2.  04/2021 T score -0.9.  Rpt 2-3 yrs   kidney stone extraction  1992   Cystoscopy: fragmented ureteral stone.  No procedures for this since then.   laser retinopexy Right 9/1 and 9/8,2020.    Outpatient Medications Prior to Visit  Medication Sig Dispense Refill   calcium citrate-vitamin D (CITRACAL+D) 315-200 MG-UNIT per tablet Take 1 tablet by mouth daily.     Cholecalciferol (VITAMIN D) 2000 units tablet Take 2,000 Units by mouth daily.     fenofibrate 54 MG tablet TAKE 1 TABLET BY MOUTH DAILY 90  tablet 0   hydrochlorothiazide (HYDRODIURIL) 25 MG tablet Take 1 tablet (25 mg total) by mouth daily. 90 tablet 3   Multiple Vitamins-Minerals (LUTEIN-ZEAXANTHIN PO) Take 125 mg by mouth daily.     Multiple Vitamins-Minerals (MULTIVITAMIN ADULTS PO) Take by mouth daily.     simvastatin (ZOCOR) 40 MG tablet Take 1 tablet (40 mg total) by mouth daily. 90 tablet 3   Turmeric 500 MG CAPS Take by mouth.     triamcinolone cream (KENALOG) 0.1 % APPLY SPARINGLY TO AFFECTED AREA TWICE A DAY (Patient not taking: Reported on 05/22/2022)     No facility-administered medications prior to visit.    Allergies  Allergen Reactions   Metoclopramide Shortness Of Breath    SOB, inc HR, nausea, fatigue   Erythromycin Other (See Comments)    Abdominal pain   Tetracyclines & Related Other (See Comments)    Joint pain   Lisinopril Cough    Review of Systems As per HPI  PE:    06/19/2022    9:55 AM 05/22/2022    8:56 AM 03/26/2022    2:18 PM  Vitals with BMI  Weight 143 lbs 6 oz 144 lbs   Systolic 125 161 130  Diastolic 82 87 76  Pulse 72 67      Physical Exam  Gen: Alert, well appearing.  Patient is oriented to person, place, time, and situation. No further exam today  LABS:  Last metabolic panel Lab Results  Component Value Date   GLUCOSE 67 (L) 05/22/2022   NA 142 05/22/2022   K 3.9 05/22/2022   CL 102 05/22/2022   CO2 30 05/22/2022   BUN 24 (H) 05/22/2022   CREATININE 0.90 05/22/2022   CALCIUM 10.4 05/22/2022   PROT 7.4 05/22/2022   ALBUMIN 4.5 05/22/2022   BILITOT 0.4 05/22/2022   ALKPHOS 40 05/22/2022   AST 24 05/22/2022   ALT 21 05/22/2022    IMPRESSION AND PLAN:  #1 hypertension, well-controlled on hydrochlorothiazide 25 mg daily.  #2 chronic renal insufficiency stage II/III. Most recent GFR 62 about 1 month ago. Discussed with patient the approach: Periodic monitoring, avoidance of nephrotoxins, maintain good hydration, good control of chronic disease.  An After  Visit Summary was printed and given to the patient.  FOLLOW UP: Return for pt to make cpe appt for 5-6 mo at her convenience.  Signed:  Santiago Bumpers, MD           06/19/2022

## 2022-09-03 ENCOUNTER — Other Ambulatory Visit: Payer: Self-pay | Admitting: Family Medicine

## 2022-11-30 ENCOUNTER — Other Ambulatory Visit: Payer: Self-pay | Admitting: Family Medicine

## 2022-12-10 ENCOUNTER — Ambulatory Visit: Payer: Medicare HMO | Admitting: *Deleted

## 2022-12-10 DIAGNOSIS — Z0000 Encounter for general adult medical examination without abnormal findings: Secondary | ICD-10-CM | POA: Diagnosis not present

## 2022-12-10 NOTE — Patient Instructions (Signed)
[name] , Thank you for taking time to come for your Medicare Wellness Visit. I appreciate your ongoing commitment to your health goals. Please review the following plan we discussed and let me know if I can assist you in the future.   Screening recommendations/referrals: Colonoscopy: no longer required Mammogram: up to date Bone Density: up to date Recommended yearly ophthalmology/optometry visit for glaucoma screening and checkup Recommended yearly dental visit for hygiene and checkup  Vaccinations: Influenza vaccine: up to date Pneumococcal vaccine: up to date Tdap vaccine: up to date Shingles vaccine: up to date    Advanced directives: yes not on file    Preventive Care 65 Years and Older, Female Preventive care refers to lifestyle choices and visits with your health care provider that can promote health and wellness. What does preventive care include? A yearly physical exam. This is also called an annual well check. Dental exams once or twice a year. Routine eye exams. Ask your health care provider how often you should have your eyes checked. Personal lifestyle choices, including: Daily care of your teeth and gums. Regular physical activity. Eating a healthy diet. Avoiding tobacco and drug use. Limiting alcohol use. Practicing safe sex. Taking low-dose aspirin every day. Taking vitamin and mineral supplements as recommended by your health care provider. What happens during an annual well check? The services and screenings done by your health care provider during your annual well check will depend on your age, overall health, lifestyle risk factors, and family history of disease. Counseling  Your health care provider may ask you questions about your: Alcohol use. Tobacco use. Drug use. Emotional well-being. Home and relationship well-being. Sexual activity. Eating habits. History of falls. Memory and ability to understand (cognition). Work and work  Astronomer. Reproductive health. Screening  You may have the following tests or measurements: Height, weight, and BMI. Blood pressure. Lipid and cholesterol levels. These may be checked every 5 years, or more frequently if you are over 15 years old. Skin check. Lung cancer screening. You may have this screening every year starting at age 6 if you have a 30-pack-year history of smoking and currently smoke or have quit within the past 15 years. Fecal occult blood test (FOBT) of the stool. You may have this test every year starting at age 46. Flexible sigmoidoscopy or colonoscopy. You may have a sigmoidoscopy every 5 years or a colonoscopy every 10 years starting at age 63. Hepatitis C blood test. Hepatitis B blood test. Sexually transmitted disease (STD) testing. Diabetes screening. This is done by checking your blood sugar (glucose) after you have not eaten for a while (fasting). You may have this done every 1-3 years. Bone density scan. This is done to screen for osteoporosis. You may have this done starting at age 59. Mammogram. This may be done every 1-2 years. Talk to your health care provider about how often you should have regular mammograms. Talk with your health care provider about your test results, treatment options, and if necessary, the need for more tests. Vaccines  Your health care provider may recommend certain vaccines, such as: Influenza vaccine. This is recommended every year. Tetanus, diphtheria, and acellular pertussis (Tdap, Td) vaccine. You may need a Td booster every 10 years. Zoster vaccine. You may need this after age 54. Pneumococcal 13-valent conjugate (PCV13) vaccine. One dose is recommended after age 38. Pneumococcal polysaccharide (PPSV23) vaccine. One dose is recommended after age 74. Talk to your health care provider about which screenings and vaccines you need  and how often you need them. This information is not intended to replace advice given to you by  your health care provider. Make sure you discuss any questions you have with your health care provider. Document Released: 01/19/2015 Document Revised: 09/12/2015 Document Reviewed: 10/24/2014 Elsevier Interactive Patient Education  2017 [employer].  Fall Prevention in the Home Falls can cause injuries. They can happen to people of all ages. There are many things you can do to make your home safe and to help prevent falls. What can I do on the outside of my home? Regularly fix the edges of walkways and driveways and fix any cracks. Remove anything that might make you trip as you walk through a door, such as a raised step or threshold. Trim any bushes or trees on the path to your home. Use bright outdoor lighting. Clear any walking paths of anything that might make someone trip, such as rocks or tools. Regularly check to see if handrails are loose or broken. Make sure that both sides of any steps have handrails. Any raised decks and porches should have guardrails on the edges. Have any leaves, snow, or ice cleared regularly. Use sand or salt on walking paths during winter. Clean up any spills in your garage right away. This includes oil or grease spills. What can I do in the bathroom? Use night lights. Install grab bars by the toilet and in the tub and shower. Do not use towel bars as grab bars. Use non-skid mats or decals in the tub or shower. If you need to sit down in the shower, use a plastic, non-slip stool. Keep the floor dry. Clean up any water that spills on the floor as soon as it happens. Remove soap buildup in the tub or shower regularly. Attach bath mats securely with double-sided non-slip rug tape. Do not have throw rugs and other things on the floor that can make you trip. What can I do in the bedroom? Use night lights. Make sure that you have a light by your bed that is easy to reach. Do not use any sheets or blankets that are too big for your bed. They should not hang  down onto the floor. Have a firm chair that has side arms. You can use this for support while you get dressed. Do not have throw rugs and other things on the floor that can make you trip. What can I do in the kitchen? Clean up any spills right away. Avoid walking on wet floors. Keep items that you use a lot in easy-to-reach places. If you need to reach something above you, use a strong step stool that has a grab bar. Keep electrical cords out of the way. Do not use floor polish or wax that makes floors slippery. If you must use wax, use non-skid floor wax. Do not have throw rugs and other things on the floor that can make you trip. What can I do with my stairs? Do not leave any items on the stairs. Make sure that there are handrails on both sides of the stairs and use them. Fix handrails that are broken or loose. Make sure that handrails are as long as the stairways. Check any carpeting to make sure that it is firmly attached to the stairs. Fix any carpet that is loose or worn. Avoid having throw rugs at the top or bottom of the stairs. If you do have throw rugs, attach them to the floor with carpet tape. Make sure that you  have a light switch at the top of the stairs and the bottom of the stairs. If you do not have them, ask someone to add them for you. What else can I do to help prevent falls? Wear shoes that: Do not have high heels. Have rubber bottoms. Are comfortable and fit you well. Are closed at the toe. Do not wear sandals. If you use a stepladder: Make sure that it is fully opened. Do not climb a closed stepladder. Make sure that both sides of the stepladder are locked into place. Ask someone to hold it for you, if possible. Clearly mark and make sure that you can see: Any grab bars or handrails. First and last steps. Where the edge of each step is. Use tools that help you move around (mobility aids) if they are needed. These  include: Canes. Walkers. Scooters. Crutches. Turn on the lights when you go into a dark area. Replace any light bulbs as soon as they burn out. Set up your furniture so you have a clear path. Avoid moving your furniture around. If any of your floors are uneven, fix them. If there are any pets around you, be aware of where they are. Review your medicines with your doctor. Some medicines can make you feel dizzy. This can increase your chance of falling. Ask your doctor what other things that you can do to help prevent falls. This information is not intended to replace advice given to you by your health care provider. Make sure you discuss any questions you have with your health care provider. Document Released: 10/19/2008 Document Revised: 05/31/2015 Document Reviewed: 01/27/2014 Elsevier Interactive Patient Education  2017 [employer].

## 2022-12-10 NOTE — Progress Notes (Addendum)
Subjective:   Karen Bell is a 77 y.o. female who presents for Medicare Annual (Subsequent) preventive examination. I connected with  Karen Bell on 12/17/22 by a video enabled telemedicine application and verified that I am speaking with the correct person using two identifiers.   I discussed the limitations of evaluation and management by telemedicine. The patient expressed understanding and agreed to proceed.  Patient location: home  Provider location: in office  Because this visit was a virtual/telehealth visit, some criteria may be missing or patient reported. Any vitals not documented were not able to be obtained and vitals that have been documented are patient reported.      Patient Medicare AWV questionnaire was completed by the patient on 12-08-2022; I have confirmed that all information answered by patient is correct and no changes since this date.  Cardiac Risk Factors include: advanced age (>85men, >93 women);hypertension     Objective:   Unable to obtain vitals due to Telephone Visit  There were no vitals filed for this visit. There is no height or weight on file to calculate BMI.     12/10/2022    9:37 AM 03/25/2022   10:52 AM 12/11/2021    9:19 AM 11/21/2020   10:22 AM 04/23/2020    7:56 PM 10/26/2019    9:43 AM 12/14/2017    9:24 AM  Advanced Directives  Does Patient Have a Medical Advance Directive? Yes Yes Yes Yes No;Yes Yes Yes  Type of Diplomatic Services operational officer Living will Healthcare Power of [place];Living will Healthcare Power of [place];Living will Healthcare Power of [place];Living will Healthcare Power of [place];Living will Living will;Healthcare Power of Attorney  Does patient want to make changes to medical advance directive?   No - Patient declined      Copy of Healthcare Power of Attorney in Chart? No - copy requested  No - copy requested No - copy requested  Yes - validated most recent copy scanned in chart  (See row information) No - copy requested    Current Medications (verified) Outpatient Encounter Medications as of 12/10/2022  Medication Sig   calcium citrate-vitamin D (CITRACAL+D) 315-200 MG-UNIT per tablet Take 1 tablet by mouth daily.   Cholecalciferol (VITAMIN D) 2000 units tablet Take 2,000 Units by mouth daily.   fenofibrate 54 MG tablet TAKE 1 TABLET BY MOUTH EVERY DAY   hydrochlorothiazide (HYDRODIURIL) 25 MG tablet Take 1 tablet (25 mg total) by mouth daily.   Multiple Vitamins-Minerals (LUTEIN-ZEAXANTHIN PO) Take 125 mg by mouth daily.   Multiple Vitamins-Minerals (MULTIVITAMIN ADULTS PO) Take by mouth daily.   simvastatin (ZOCOR) 40 MG tablet Take 1 tablet (40 mg total) by mouth daily.   Turmeric 500 MG CAPS Take by mouth.   No facility-administered encounter medications on file as of 12/10/2022.    Allergies (verified) Metoclopramide, Erythromycin, Tetracyclines & related, and Lisinopril   History: Past Medical History:  Diagnosis Date   Acquired hammertoes of both feet    Anterior subluxation of right shoulder 07/2017   Relocated by Dr. Katrinka Blazing   Cervical disc disorder with radiculopathy    "   "       "      "   Chronic renal insufficiency, stage 3 (moderate) (HCC)    GFR 50s (avg sCr around 1.0)   Colon cancer screening    Colonoscopy normal 2008.  Cologuard NEG 06/2016.  Repeat 12/2019 NEG. Consider rpt 2024.   Diverticulosis    Duodenal diverticulum  11/2017   pancreatic cyst vs duodenal diverticulum partially obstructing ampulla??  Mild dilatation of CBD and main pancreatic duct.  Repeat MRI 02/2018 showed NORMAL pancreas and confirmed duodenal diverticulum.  CBD normal.   Family history of abdominal aortic aneurysm    pt has had AAA screening x 2, most recent was 02/2015.   Fracture of navicular bone of right wrist 2022   [facility]   Greater trochanteric pain syndrome of left lower extremity    2024--PT helpful   Hepatic steatosis    mild (MRI abd 02/2018)   History  of migraine    These resolved after menopause   HTN (hypertension)    + hypertensive retinopathy   Hyperlipidemia, mixed    Insomnia    maintenance problems; trazodone no help   Intractable hiccups 10/2017   resolved with PPI and reglan   Left rotator cuff tear arthropathy 04/2015   Dr. Terrilee Files   Nephrolithiasis 1994   OAB (overactive bladder)    Osteoarthritis    hips, mild on radiographs 03/2022   Osteopenia 08/2018   DEXA 08/2018 T-score -1.2.  Rpt 04/2021 T score -0.9. Rpt 2-3 yrs.   Renal cysts, acquired, bilateral 11/12/2017   R kidney with 2.4x 2.4x 2.4cm complex cyst-->MR abd-->Bosniak 1 and 2, no worrisome renal lesion.  Stable on MRI abd 02/2018.   Retinal tear of right eye    Laser retinopexy 9/1 and 9/8, 2020.   Past Surgical History:  Procedure Laterality Date   Aortic ultrasound  02/13/13; 02/2015   no aneurism (screening for high risk)   BUNIONECTOMY  2006   bilat   carotid dopplers (screening for PAD)  11/16/2008   "minor left carotid dz" per old records.  No hx of TIA/CVA.   CESAREAN SECTION  x 2    1997 & 1999   COLONOSCOPY  07/2006   recall 10 yrs.  'tics.  (Dr. Raphael Gibney in [place], [place])   CYST EXCISION     right index finger, base of fingernail   DEXA  08/2018   2020 T-score -1.2.  04/2021 T score -0.9.  Rpt 2-3 yrs   kidney stone extraction  1992   Cystoscopy: fragmented ureteral stone.  No procedures for this since then.   laser retinopexy Right 9/1 and 9/8,2020.   Family History  Problem Relation Age of Onset   Cancer Mother        ? ovarian vs endometrial   Hyperlipidemia Father    Hypertension Father    Bladder Cancer Brother    AAA (abdominal aortic aneurysm) Brother    Colon cancer Other        Negative history.   Diabetes Other    Kidney disease Brother    AAA (abdominal aortic aneurysm) Brother    Dementia Brother    Heart disease Brother    AAA (abdominal aortic aneurysm) Brother    Diabetes Brother    Hypertension Brother    Social  History   Socioeconomic History   Marital status: Married    Spouse name: Not on file   Number of children: Not on file   Years of education: Not on file   Highest education level: 12th grade  Occupational History   Not on file  Tobacco Use   Smoking status: Never   Smokeless tobacco: Never  Vaping Use   Vaping status: Never Used  Substance and Sexual Activity   Alcohol use: Yes    Alcohol/week: 0.0 standard drinks of alcohol  Comment: rarely   Drug use: Never   Sexual activity: Not Currently  Other Topics Concern   Not on file  Social History Narrative   Married, 2 daughters.  Four older brothers, 3 of which have had abdominal aneurisms.   Relocated from [place] area 05/2013.   Occupation: retired Advice worker.   No tob, occ alcohol.     Social Determinants of Health   Financial Resource Strain: Low Risk  (12/10/2022)   Overall Financial Resource Strain (CARDIA)    Difficulty of Paying Living Expenses: Not hard at all  Food Insecurity: No Food Insecurity (12/10/2022)   Hunger Vital Sign    Worried About Running Out of Food in the Last Year: Never true    Ran Out of Food in the Last Year: Never true  Transportation Needs: No Transportation Needs (12/10/2022)   PRAPARE - Administrator, Civil Service (Medical): No    Lack of Transportation (Non-Medical): No  Physical Activity: Sufficiently Active (12/10/2022)   Exercise Vital Sign    Days of Exercise per Week: 5 days    Minutes of Exercise per Session: 30 min  Stress: No Stress Concern Present (12/10/2022)   [employer] of Occupational Health - Occupational Stress Questionnaire    Feeling of Stress : Not at all  Social Connections: Moderately Integrated (12/10/2022)   Social Connection and Isolation Panel [NHANES]    Frequency of Communication with Friends and Family: More than three times a week    Frequency of Social Gatherings with Friends and Family: Twice a week    Attends Religious Services:  More than 4 times per year    Active Member of [employer] or Organizations: No    Attends Engineer, structural: Never    Marital Status: Married    Tobacco Counseling Counseling given: Not Answered   Clinical Intake:  Pre-visit preparation completed: Yes  Pain : No/denies pain     Diabetes: No  How often do you need to have someone help you when you read instructions, pamphlets, or other written materials from your doctor or pharmacy?: 1 - Never  Interpreter Needed?: No  Information entered by :: Remi Haggard LPN   Activities of Daily Living    12/10/2022    9:48 AM 12/08/2022   12:05 PM  In your present state of health, do you have any difficulty performing the following activities:  Hearing? 0 0  Vision? 0 0  Difficulty concentrating or making decisions? 1 1  Walking or climbing stairs? 0 0  Dressing or bathing? 0 0  Doing errands, shopping? 0 0  Preparing Food and eating ? N N  Using the Toilet? N N  In the past six months, have you accidently leaked urine? Y Y  Do you have problems with loss of bowel control? N N  Managing your Medications? N N  Managing your Finances? N N  Housekeeping or managing your Housekeeping? N N    Patient Care Team: Jeoffrey Massed, MD as PCP - General (Family Medicine) Aris Lot, MD as Consulting Physician (Dermatology) Shea Evans, MD as Consulting Physician (Obstetrics and Gynecology) Judi Saa, DO as Consulting Physician (Sports Medicine) Charlsie Merles Kirstie Peri, DPM as Consulting Physician (Podiatry) Jill Side, OD as Referring Physician Tomasa Hose (Dentistry) Maeola Sarah, MD as Consulting Physician (Ophthalmology)  Indicate any recent Medical Services you may have received from other than [facility] providers in the past year (date may be approximate).     Assessment:  This is a routine wellness examination for Andrienne.  Hearing/Vision screen Hearing Screening - Comments:: Does not wear hearing  aids Some trouble  Vision Screening - Comments:: Up to date [facility]   Goals Addressed             This Visit's Progress    Increase physical activity   Not on track    Increase activity.      Patient Stated       Continue current lifestyle       Depression Screen    12/10/2022    9:36 AM 06/19/2022    9:59 AM 05/22/2022    9:02 AM 12/11/2021    9:26 AM 11/21/2021    8:31 AM 04/03/2021   11:04 AM 11/21/2020   10:21 AM  PHQ 2/9 Scores  PHQ - 2 Score 0 0 1 0 0 0 0  PHQ- 9 Score 2 2 5         Fall Risk    12/10/2022    9:48 AM 12/08/2022   12:05 PM 06/19/2022    9:58 AM 05/22/2022    9:02 AM 05/20/2022   10:17 AM  Fall Risk   Falls in the past year? 0 0 1 1 1   Number falls in past yr: 0  0 0 0  Injury with Fall? 0  0 0 0  Follow up Falls evaluation completed;Education provided;Falls prevention discussed        MEDICARE RISK AT HOME: Medicare Risk at Home Any stairs in or around the home?: Yes If so, are there any without handrails?: No Home free of loose throw rugs in walkways, pet beds, electrical cords, etc?: Yes Adequate lighting in your home to reduce risk of falls?: Yes Life alert?: No Use of a cane, walker or w/c?: No Grab bars in the bathroom?: No Shower chair or bench in shower?: No Elevated toilet seat or a handicapped toilet?: No  TIMED UP AND GO:  Was the test performed?  No    Cognitive Function:    12/14/2017    9:27 AM  MMSE - Mini Mental State Exam  Orientation to time 5  Orientation to Place 5  Registration 3  Attention/ Calculation 5  Recall 3  Language- name 2 objects 2  Language- repeat 1  Language- follow 3 step command 3  Language- read & follow direction 1  Write a sentence 1  Copy design 1  Total score 30        12/10/2022    9:35 AM 12/11/2021    9:22 AM 11/21/2020   10:27 AM  6CIT Screen  What Year? 0 points 0 points 0 points  What month? 0 points 0 points 0 points  What time? 0 points 0 points 0 points  Count back  from 20 0 points 0 points 0 points  Months in reverse 0 points 0 points 0 points  Repeat phrase 0 points 0 points 0 points  Total Score 0 points 0 points 0 points    Immunizations Immunization History  Administered Date(s) Administered   Fluad Quad(high Dose 65+) 10/31/2020, 11/15/2021   Influenza, High Dose Seasonal PF 10/09/2016, 09/19/2017, 11/03/2019   Influenza-Unspecified 09/24/2015   PFIZER(Purple Top)SARS-COV-2 Vaccination 01/18/2019, 02/15/2019   Pneumococcal Conjugate-13 11/09/2013   Pneumococcal Polysaccharide-23 06/15/2015   Tdap 05/21/2019, 04/21/2020   Zoster Recombinant(Shingrix) 01/31/2018, 07/08/2018    TDAP status: Up to date  Flu Vaccine status: Up to date  Pneumococcal vaccine status: Up to date  Covid-19 vaccine status: Declined,  Education has been provided regarding the importance of this vaccine but patient still declined. Advised may receive this vaccine at local pharmacy or Health Dept.or vaccine clinic. Aware to provide a copy of the vaccination record if obtained from local pharmacy or Health Dept. Verbalized acceptance and understanding.  Qualifies for Shingles Vaccine? No   Zostavax completed Yes   Shingrix Completed?: Yes  Screening Tests Health Maintenance  Topic Date Due   COVID-19 Vaccine (3 - 2023-24 season) 12/26/2022 (Originally 09/07/2022)   Medicare Annual Wellness (AWV)  12/10/2023   DTaP/Tdap/Td (3 - Td or Tdap) 04/22/2030   Pneumonia Vaccine 79+ Years old  Completed   INFLUENZA VACCINE  Completed   DEXA SCAN  Completed   Hepatitis C Screening  Completed   Zoster Vaccines- Shingrix  Completed   HPV VACCINES  Aged Out   Colonoscopy  Discontinued    Health Maintenance  There are no preventive care reminders to display for this patient.   Colorectal cancer screening: No longer required.   Mammogram status: Completed  . Repeat every year  Bone Density status: Completed 2023. Results reflect: Bone density results: NORMAL. Repeat  every 3 years.  Lung Cancer Screening: (Low Dose CT Chest recommended if Age 62-80 years, 20 pack-year currently smoking OR have quit w/in 15years.) does not qualify.   Lung Cancer Screening Referral:   Additional Screening:  Hepatitis C Screening: does not qualify; Completed 2017  Vision Screening: Recommended annual ophthalmology exams for early detection of glaucoma and other disorders of the eye. Is the patient up to date with their annual eye exam?  Yes  Who is the provider or what is the name of the office in which the patient attends annual eye exams? [facility] If pt is not established with a provider, would they like to be referred to a provider to establish care? No .   Dental Screening: Recommended annual dental exams for proper oral hygiene    Community Resource Referral / Chronic Care Management: CRR required this visit?  No   CCM required this visit?  No     Plan:     I have personally reviewed and noted the following in the patient's chart:   Medical and social history Use of alcohol, tobacco or illicit drugs  Current medications and supplements including opioid prescriptions. Patient is not currently taking opioid prescriptions. Functional ability and status Nutritional status Physical activity Advanced directives List of other physicians Hospitalizations, surgeries, and ER visits in previous 12 months Vitals Screenings to include cognitive, depression, and falls Referrals and appointments  In addition, I have reviewed and discussed with patient certain preventive protocols, quality metrics, and best practice recommendations. A written personalized care plan for preventive services as well as general preventive health recommendations were provided to patient.     Remi Haggard, LPN   45/04/[id number]   After Visit Summary: (MyChart) Due to this being a telephonic visit, the after visit summary with patients personalized plan was offered to patient via MyChart    Nurse Notes:

## 2022-12-18 ENCOUNTER — Encounter: Payer: Self-pay | Admitting: Family Medicine

## 2022-12-18 ENCOUNTER — Ambulatory Visit (INDEPENDENT_AMBULATORY_CARE_PROVIDER_SITE_OTHER): Payer: Medicare HMO | Admitting: Family Medicine

## 2022-12-18 VITALS — BP 136/84 | HR 72 | Ht 64.0 in | Wt 144.2 lb

## 2022-12-18 DIAGNOSIS — Z0000 Encounter for general adult medical examination without abnormal findings: Secondary | ICD-10-CM | POA: Diagnosis not present

## 2022-12-18 DIAGNOSIS — N1831 Chronic kidney disease, stage 3a: Secondary | ICD-10-CM | POA: Diagnosis not present

## 2022-12-18 DIAGNOSIS — E782 Mixed hyperlipidemia: Secondary | ICD-10-CM

## 2022-12-18 DIAGNOSIS — I10 Essential (primary) hypertension: Secondary | ICD-10-CM | POA: Diagnosis not present

## 2022-12-18 MED ORDER — SIMVASTATIN 40 MG PO TABS: 40.0000 mg | ORAL_TABLET | Freq: Every day | ORAL | 1 refills | Status: DC

## 2022-12-18 MED ORDER — FENOFIBRATE 54 MG PO TABS: 54.0000 mg | ORAL_TABLET | Freq: Every day | ORAL | 1 refills | Status: DC

## 2022-12-18 MED ORDER — FENOFIBRATE 54 MG PO TABS: 54.0000 mg | ORAL_TABLET | Freq: Every day | ORAL | 3 refills | Status: DC

## 2022-12-18 MED ORDER — HYDROCHLOROTHIAZIDE 25 MG PO TABS: 25.0000 mg | ORAL_TABLET | Freq: Every day | ORAL | 1 refills | Status: DC

## 2022-12-18 NOTE — Patient Instructions (Signed)

## 2022-12-18 NOTE — Progress Notes (Signed)
Office Note 12/18/2022  CC:  Chief Complaint  Patient presents with   Annual Exam    Pt is fasting.     HPI:  Patient is a 77 y.o. female who is here for annual health maintenance exam and 33-month follow-up hypertension, mixed hyperlipidemia, and chronic renal insufficiency stage III.  Mild ongoing left hip (lateral aspect) pain.  She walks daily but admits she doesn't do her stretches much. Home bp's 130/80 avg.   Past Medical History:  Diagnosis Date   Acquired hammertoes of both feet    Anterior subluxation of right shoulder 07/2017   Relocated by Dr. Katrinka Blazing   Cervical disc disorder with radiculopathy    "   "       "      "   Chronic renal insufficiency, stage 3 (moderate) (HCC)    GFR 50s (avg sCr around 1.0)   Colon cancer screening    Colonoscopy normal 2008.  Cologuard NEG 06/2016.  Repeat 12/2019 NEG. Consider rpt 2024.   Diverticulosis    Duodenal diverticulum 11/2017   pancreatic cyst vs duodenal diverticulum partially obstructing ampulla??  Mild dilatation of CBD and main pancreatic duct.  Repeat MRI 02/2018 showed NORMAL pancreas and confirmed duodenal diverticulum.  CBD normal.   Family history of abdominal aortic aneurysm    pt has had AAA screening x 2, most recent was 02/2015.   Fracture of navicular bone of right wrist 2022   [facility]   Greater trochanteric pain syndrome of left lower extremity    2024--PT helpful   Hepatic steatosis    mild (MRI abd 02/2018)   History of migraine    These resolved after menopause   HTN (hypertension)    + hypertensive retinopathy   Hyperlipidemia, mixed    Insomnia    maintenance problems; trazodone no help   Intractable hiccups 10/2017   resolved with PPI and reglan   Left rotator cuff tear arthropathy 04/2015   Dr. Terrilee Files   Nephrolithiasis 1994   OAB (overactive bladder)    Osteoarthritis    hips, mild on radiographs 03/2022   Osteopenia 08/2018   DEXA 08/2018 T-score -1.2.  Rpt 04/2021 T score -0.9. Rpt 2-3  yrs.   Renal cysts, acquired, bilateral 11/12/2017   R kidney with 2.4x 2.4x 2.4cm complex cyst-->MR abd-->Bosniak 1 and 2, no worrisome renal lesion.  Stable on MRI abd 02/2018.   Retinal tear of right eye    Laser retinopexy 9/1 and 9/8, 2020.    Past Surgical History:  Procedure Laterality Date   Aortic ultrasound  02/13/13; 02/2015   no aneurism (screening for high risk)   BUNIONECTOMY  2006   bilat   carotid dopplers (screening for PAD)  11/16/2008   "minor left carotid dz" per old records.  No hx of TIA/CVA.   CESAREAN SECTION  x 2    1997 & 1999   COLONOSCOPY  07/2006   recall 10 yrs.  'tics.  (Dr. Raphael Gibney in [place], [place])   CYST EXCISION     right index finger, base of fingernail   DEXA  08/2018   2020 T-score -1.2.  04/2021 T score -0.9.  Rpt 2-3 yrs   kidney stone extraction  1992   Cystoscopy: fragmented ureteral stone.  No procedures for this since then.   laser retinopexy Right 9/1 and 9/8,2020.    Family History  Problem Relation Age of Onset   Cancer Mother        ?  ovarian vs endometrial   Hyperlipidemia Father    Hypertension Father    Bladder Cancer Brother    AAA (abdominal aortic aneurysm) Brother    Colon cancer Other        Negative history.   Diabetes Other    Kidney disease Brother    AAA (abdominal aortic aneurysm) Brother    Dementia Brother    Heart disease Brother    AAA (abdominal aortic aneurysm) Brother    Diabetes Brother    Hypertension Brother     Social History   Socioeconomic History   Marital status: Married    Spouse name: Not on file   Number of children: Not on file   Years of education: Not on file   Highest education level: 12th grade  Occupational History   Not on file  Tobacco Use   Smoking status: Never   Smokeless tobacco: Never  Vaping Use   Vaping status: Never Used  Substance and Sexual Activity   Alcohol use: Yes    Alcohol/week: 0.0 standard drinks of alcohol    Comment: rarely   Drug use: Never   Sexual  activity: Not Currently  Other Topics Concern   Not on file  Social History Narrative   Married, 2 daughters.  Four older brothers, 3 of which have had abdominal aneurisms.   Relocated from [place] area 05/2013.   Occupation: retired Advice worker.   No tob, occ alcohol.     Social Drivers of Corporate investment banker Strain: Low Risk  (12/10/2022)   Overall Financial Resource Strain (CARDIA)    Difficulty of Paying Living Expenses: Not hard at all  Food Insecurity: No Food Insecurity (12/10/2022)   Hunger Vital Sign    Worried About Running Out of Food in the Last Year: Never true    Ran Out of Food in the Last Year: Never true  Transportation Needs: No Transportation Needs (12/10/2022)   PRAPARE - Administrator, Civil Service (Medical): No    Lack of Transportation (Non-Medical): No  Physical Activity: Sufficiently Active (12/10/2022)   Exercise Vital Sign    Days of Exercise per Week: 5 days    Minutes of Exercise per Session: 30 min  Stress: No Stress Concern Present (12/10/2022)   [employer] of Occupational Health - Occupational Stress Questionnaire    Feeling of Stress : Not at all  Social Connections: Moderately Integrated (12/10/2022)   Social Connection and Isolation Panel [NHANES]    Frequency of Communication with Friends and Family: More than three times a week    Frequency of Social Gatherings with Friends and Family: Twice a week    Attends Religious Services: More than 4 times per year    Active Member of [employer] or Organizations: No    Attends Banker Meetings: Never    Marital Status: Married  Catering manager Violence: Not At Risk (12/10/2022)   Humiliation, Afraid, Rape, and Kick questionnaire    Fear of Current or Ex-Partner: No    Emotionally Abused: No    Physically Abused: No    Sexually Abused: No    Outpatient Medications Prior to Visit  Medication Sig Dispense Refill   calcium citrate-vitamin D (CITRACAL+D) 315-200  MG-UNIT per tablet Take 1 tablet by mouth daily.     Cholecalciferol (VITAMIN D) 2000 units tablet Take 2,000 Units by mouth daily.     fenofibrate 54 MG tablet TAKE 1 TABLET BY MOUTH EVERY DAY 30 tablet 0  hydrochlorothiazide (HYDRODIURIL) 25 MG tablet Take 1 tablet (25 mg total) by mouth daily. 90 tablet 3   Multiple Vitamins-Minerals (LUTEIN-ZEAXANTHIN PO) Take 125 mg by mouth daily.     Multiple Vitamins-Minerals (MULTIVITAMIN ADULTS PO) Take by mouth daily.     simvastatin (ZOCOR) 40 MG tablet Take 1 tablet (40 mg total) by mouth daily. 90 tablet 3   Turmeric 500 MG CAPS Take by mouth.     No facility-administered medications prior to visit.    Allergies  Allergen Reactions   Metoclopramide Shortness Of Breath    SOB, inc HR, nausea, fatigue   Erythromycin Other (See Comments)    Abdominal pain   Tetracyclines & Related Other (See Comments)    Joint pain   Lisinopril Cough    Review of Systems  Constitutional:  Negative for appetite change, chills, fatigue and fever.  HENT:  Negative for congestion, dental problem, ear pain and sore throat.   Eyes:  Negative for discharge, redness and visual disturbance.  Respiratory:  Negative for cough, chest tightness, shortness of breath and wheezing.   Cardiovascular:  Negative for chest pain, palpitations and leg swelling.  Gastrointestinal:  Negative for abdominal pain, blood in stool, diarrhea, nausea and vomiting.  Genitourinary:  Negative for difficulty urinating, dysuria, flank pain, frequency, hematuria and urgency.  Musculoskeletal:  Positive for arthralgias (left hip). Negative for back pain, joint swelling, myalgias and neck stiffness.  Skin:  Negative for pallor and rash.  Neurological:  Negative for dizziness, speech difficulty, weakness and headaches.  Hematological:  Negative for adenopathy. Does not bruise/bleed easily.  Psychiatric/Behavioral:  Negative for confusion and sleep disturbance. The patient is not  nervous/anxious.     PE;    12/18/2022    8:22 AM 06/19/2022    9:55 AM 05/22/2022    8:56 AM  Vitals with BMI  Height 5\' 4"     Weight 144 lbs 3 oz 143 lbs 6 oz 144 lbs  BMI 24.74    Systolic 148 125 161  Diastolic 87 82 87  Pulse 72 72 67   Exam chaperoned by Cloe Motsinger, CMA Gen: Alert, well appearing.  Patient is oriented to person, place, time, and situation. AFFECT: pleasant, lucid thought and speech. ENT: Ears: EACs clear, normal epithelium.  TMs with good light reflex and landmarks bilaterally.  Eyes: no injection, icteris, swelling, or exudate.  EOMI, PERRLA. Nose: no drainage or turbinate edema/swelling.  No injection or focal lesion.  Mouth: lips without lesion/swelling.  Oral mucosa pink and moist.  Dentition intact and without obvious caries or gingival swelling.  Oropharynx without erythema, exudate, or swelling.  Neck: supple/nontender.  No LAD, mass, or TM.  Carotid pulses 2+ bilaterally, without bruits. CV: RRR, no m/r/g.   LUNGS: CTA bilat, nonlabored resps, good aeration in all lung fields. ABD: soft, NT, ND, BS normal.  No hepatospenomegaly or mass.  No bruits. EXT: no clubbing, cyanosis, or edema.  Musculoskeletal: no joint swelling, erythema, warmth, or tenderness.  ROM of all joints intact. Skin - no sores or suspicious lesions or rashes or color changes  Pertinent labs:  Lab Results  Component Value Date   TSH 2.32 10/26/2017   Lab Results  Component Value Date   WBC 6.5 11/21/2021   HGB 14.1 11/21/2021   HCT 41.5 11/21/2021   MCV 88.3 11/21/2021   PLT 278 11/21/2021   Lab Results  Component Value Date   CREATININE 0.90 05/22/2022   BUN 24 (H) 05/22/2022   NA 142  05/22/2022   K 3.9 05/22/2022   CL 102 05/22/2022   CO2 30 05/22/2022   Lab Results  Component Value Date   ALT 21 05/22/2022   AST 24 05/22/2022   ALKPHOS 40 05/22/2022   BILITOT 0.4 05/22/2022   Lab Results  Component Value Date   CHOL 148 11/21/2021   Lab Results   Component Value Date   HDL 46 (L) 11/21/2021   Lab Results  Component Value Date   LDLCALC 76 11/21/2021   Lab Results  Component Value Date   TRIG 154 (H) 11/21/2021   Lab Results  Component Value Date   CHOLHDL 3.2 11/21/2021   ASSESSMENT AND PLAN:   #1 health maintenance exam: Reviewed age and gender appropriate health maintenance issues (prudent diet, regular exercise, health risks of tobacco and excessive alcohol, use of seatbelts, fire alarms in home, use of sunscreen).  Also reviewed age and gender appropriate health screening as well as vaccine recommendations. Vaccines: ALL UTD. Labs: CBC, CMET, FLP. Cervical ca screening: no further pap/pelvic indicated. Breast ca screening: Mammo due 02/2023. Colon ca screening: hx of normal colonoscopy 2008.  Cologuard neg 2018 and 2021.  Consider rpt cologuard but pt preferred to decline any further col ca screening. Osteoporosis screening: next DEXA 2025  #2 hypertension, well-controlled on HCTZ 25 mg a day. Electrolytes and creatinine today.  3.  Mixed hyperlipidemia, doing well long-term on Zocor 40 mg a day and fenofibrate 54 mg daily. Lipid and hepatic panel today.  4.  Chronic renal insufficiency stage III. She avoids NSAIDs and hydrates well. Electrolytes and creatinine monitoring today.  An After Visit Summary was printed and given to the patient.  FOLLOW UP:  No follow-ups on file.  Signed:  Santiago Bumpers, MD           12/18/2022

## 2022-12-18 NOTE — Addendum Note (Signed)
Addended by: Arty Baumgartner A on: 12/18/2022 08:52 AM   Modules accepted: Orders

## 2022-12-19 LAB — CBC
HCT: 42.4 % (ref 35.0–45.0)
Hemoglobin: 14.6 g/dL (ref 11.7–15.5)
MCH: 30.9 pg (ref 27.0–33.0)
MCHC: 34.4 g/dL (ref 32.0–36.0)
MCV: 89.6 fL (ref 80.0–100.0)
MPV: 9.6 fL (ref 7.5–12.5)
Platelets: 310 10*3/uL (ref 140–400)
RBC: 4.73 10*6/uL (ref 3.80–5.10)
RDW: 12 % (ref 11.0–15.0)
WBC: 6.7 10*3/uL (ref 3.8–10.8)

## 2022-12-19 LAB — COMPREHENSIVE METABOLIC PANEL
AG Ratio: 1.7 (calc) (ref 1.0–2.5)
ALT: 27 U/L (ref 6–29)
AST: 22 U/L (ref 10–35)
Albumin: 4.9 g/dL (ref 3.6–5.1)
Alkaline phosphatase (APISO): 45 U/L (ref 37–153)
BUN: 24 mg/dL (ref 7–25)
CO2: 32 mmol/L (ref 20–32)
Calcium: 10.4 mg/dL (ref 8.6–10.4)
Chloride: 102 mmol/L (ref 98–110)
Creat: 0.93 mg/dL (ref 0.60–1.00)
Globulin: 2.9 g/dL (ref 1.9–3.7)
Glucose, Bld: 84 mg/dL (ref 65–99)
Potassium: 4.5 mmol/L (ref 3.5–5.3)
Sodium: 142 mmol/L (ref 135–146)
Total Bilirubin: 0.4 mg/dL (ref 0.2–1.2)
Total Protein: 7.8 g/dL (ref 6.1–8.1)

## 2022-12-19 LAB — LIPID PANEL
Cholesterol: 148 mg/dL (ref <200)
HDL: 53 mg/dL (ref >=50)
LDL Cholesterol (Calc): 74 mg/dL
Non-HDL Cholesterol (Calc): 95 mg/dL (ref <130)
Total CHOL/HDL Ratio: 2.8 (calc) (ref <5.0)
Triglycerides: 130 mg/dL (ref <150)

## 2023-03-11 ENCOUNTER — Other Ambulatory Visit: Payer: Self-pay | Admitting: Obstetrics & Gynecology

## 2023-03-11 DIAGNOSIS — Z1231 Encounter for screening mammogram for malignant neoplasm of breast: Secondary | ICD-10-CM

## 2023-04-01 ENCOUNTER — Ambulatory Visit
Admission: RE | Admit: 2023-04-01 | Discharge: 2023-04-01 | Disposition: A | Discharge status: Home / Self Care | Source: Ambulatory Visit | Attending: Obstetrics & Gynecology | Admitting: Obstetrics & Gynecology

## 2023-04-01 DIAGNOSIS — Z1231 Encounter for screening mammogram for malignant neoplasm of breast: Secondary | ICD-10-CM

## 2023-05-15 ENCOUNTER — Other Ambulatory Visit: Payer: Self-pay | Admitting: Family Medicine

## 2023-06-23 ENCOUNTER — Encounter: Payer: Self-pay | Admitting: Family Medicine

## 2023-06-23 ENCOUNTER — Ambulatory Visit (INDEPENDENT_AMBULATORY_CARE_PROVIDER_SITE_OTHER): Payer: Medicare HMO | Admitting: Family Medicine

## 2023-06-23 VITALS — BP 122/80 | HR 81 | Temp 98.2°F | Ht 64.0 in | Wt 143.2 lb

## 2023-06-23 DIAGNOSIS — Z0184 Encounter for antibody response examination: Secondary | ICD-10-CM

## 2023-06-23 DIAGNOSIS — E782 Mixed hyperlipidemia: Secondary | ICD-10-CM

## 2023-06-23 DIAGNOSIS — E348 Other specified endocrine disorders: Secondary | ICD-10-CM

## 2023-06-23 DIAGNOSIS — I10 Essential (primary) hypertension: Secondary | ICD-10-CM | POA: Diagnosis not present

## 2023-06-23 DIAGNOSIS — Z0110 Encounter for examination of ears and hearing without abnormal findings: Secondary | ICD-10-CM

## 2023-06-23 DIAGNOSIS — N1831 Chronic kidney disease, stage 3a: Secondary | ICD-10-CM

## 2023-06-23 NOTE — Addendum Note (Signed)
 Addended by: Terris Fickle D on: 06/23/2023 10:08 AM   Modules accepted: Orders

## 2023-06-23 NOTE — Progress Notes (Signed)
 OFFICE VISIT  06/23/2023  CC:  Chief Complaint  Patient presents with   Medical Management of Chronic Issues    Patient is a 78 y.o. female who presents for 2-month follow-up of hypertension, hyperlipidemia, and chronic renal insufficiency. A/P as of last visit: #1 hypertension, well-controlled on HCTZ 25 mg a day. Electrolytes and creatinine today.   2.  Mixed hyperlipidemia, doing well long-term on Zocor  40 mg a day and fenofibrate  54 mg daily. Lipid and hepatic panel today.   3.  Chronic renal insufficiency stage III. She avoids NSAIDs and hydrates well. Electrolytes and creatinine monitoring today.  INTERIM HX: Karen Bell is feeling well. She remains really active, eats a healthy diet. She is planning to travel a lot--> various areas in [place], cruise as well  Home bp's reviewed: avg 130/80, HR 60s-70s.  ROS --> no fevers, no CP, no SOB, no wheezing, no cough, no dizziness, no HAs, no rashes, no melena/hematochezia.  No polyuria or polydipsia.  No myalgias or arthralgias.  No focal weakness, paresthesias, or tremors.  No acute vision or hearing abnormalities.  No dysuria or unusual/new urinary urgency or frequency.  No recent changes in lower legs. No n/v/d or abd pain.  No palpitations.    Past Medical History:  Diagnosis Date   Acquired hammertoes of both feet    Anterior subluxation of right shoulder 07/2017   Relocated by Dr. Felipe Horton   Cervical disc disorder with radiculopathy                       Chronic renal insufficiency, stage 3 (moderate) (HCC)    GFR 50s (avg sCr around 1.0)   Colon cancer screening    Colonoscopy normal 2008.  Cologuard NEG 06/2016.  Repeat 12/2019 NEG. Consider rpt 2024.   Diverticulosis    Duodenal diverticulum 11/2017   pancreatic cyst vs duodenal diverticulum partially obstructing ampulla??  Mild dilatation of CBD and main pancreatic duct.  Repeat MRI 02/2018 showed NORMAL pancreas and confirmed duodenal diverticulum.  CBD normal.    Family history of abdominal aortic aneurysm    pt has had AAA screening x 2, most recent was 02/2015.   Fracture of navicular bone of right wrist 2022   [facility]   Greater trochanteric pain syndrome of left lower extremity    2024--PT helpful   Hepatic steatosis    mild (MRI abd 02/2018)   History of migraine    These resolved after menopause   HTN (hypertension)    + hypertensive retinopathy   Hyperlipidemia, mixed    Insomnia    maintenance problems; trazodone no help   Intractable hiccups 10/2017   resolved with PPI and reglan   Left rotator cuff tear arthropathy 04/2015   Dr. Napolean Backbone   Nephrolithiasis 1994   OAB (overactive bladder)    Osteoarthritis    hips, mild on radiographs 03/2022   Osteopenia 08/2018   DEXA 08/2018 T-score -1.2.  Rpt 04/2021 T score -0.9. Rpt 2-3 yrs.   Renal cysts, acquired, bilateral 11/12/2017   R kidney with 2.4x 2.4x 2.4cm complex cyst-->MR abd-->Bosniak 1 and 2, no worrisome renal lesion.  Stable on MRI abd 02/2018.   Retinal tear of right eye    Laser retinopexy 9/1 and 9/8, 2020.    Past Surgical History:  Procedure Laterality Date   Aortic ultrasound  02/13/13; 02/2015   no aneurism (screening for high risk)   BUNIONECTOMY  2006   bilat   carotid dopplers (screening  for PAD)  11/16/2008   minor left carotid dz per old records.  No hx of TIA/CVA.   CESAREAN SECTION  x 2    1997 & 1999   COLONOSCOPY  07/2006   recall 10 yrs.  'tics.  (Dr. Arnell Lange in [place], [place])   CYST EXCISION     right index finger, base of fingernail   DEXA  08/2018   2020 T-score -1.2.  04/2021 T score -0.9.  Rpt 2-3 yrs   kidney stone extraction  1992   Cystoscopy: fragmented ureteral stone.  No procedures for this since then.   laser retinopexy Right 9/1 and 9/8,2020.    Outpatient Medications Prior to Visit  Medication Sig Dispense Refill   calcium citrate-vitamin D (CITRACAL+D) 315-200 MG-UNIT per tablet Take 1 tablet by mouth daily.     Cholecalciferol (VITAMIN  D) 2000 units tablet Take 2,000 Units by mouth daily.     fenofibrate  54 MG tablet Take 1 tablet (54 mg total) by mouth daily. 90 tablet 1   hydrochlorothiazide  (HYDRODIURIL ) 25 MG tablet TAKE 1 TABLET (25 MG TOTAL) BY MOUTH DAILY. 90 tablet 0   Multiple Vitamins-Minerals (LUTEIN-ZEAXANTHIN PO) Take 125 mg by mouth daily.     Multiple Vitamins-Minerals (MULTIVITAMIN ADULTS PO) Take by mouth daily.     simvastatin  (ZOCOR ) 40 MG tablet Take 1 tablet (40 mg total) by mouth daily. 90 tablet 1   No facility-administered medications prior to visit.    Allergies  Allergen Reactions   Metoclopramide Shortness Of Breath    SOB, inc HR, nausea, fatigue   Erythromycin Other (See Comments)    Abdominal pain   Tetracyclines & Related Other (See Comments)    Joint pain   Lisinopril  Cough    Review of Systems As per HPI  PE:    06/23/2023    9:34 AM 12/18/2022    8:41 AM 12/18/2022    8:22 AM  Vitals with BMI  Height 5' 4  5' 4  Weight 143 lbs 3 oz  144 lbs 3 oz  BMI 24.57  24.74  Systolic 122 136 161  Diastolic 80 84 87  Pulse 81  72     Physical Exam  Gen: Alert, well appearing.  Patient is oriented to person, place, time, and situation. AFFECT: pleasant, lucid thought and speech. No further exam today  LABS:  Last CBC Lab Results  Component Value Date   WBC 6.7 12/18/2022   HGB 14.6 12/18/2022   HCT 42.4 12/18/2022   MCV 89.6 12/18/2022   MCH 30.9 12/18/2022   RDW 12.0 12/18/2022   PLT 310 12/18/2022   Last metabolic panel Lab Results  Component Value Date   GLUCOSE 84 12/18/2022   NA 142 12/18/2022   K 4.5 12/18/2022   CL 102 12/18/2022   CO2 32 12/18/2022   BUN 24 12/18/2022   CREATININE 0.93 12/18/2022   GFR 62.01 05/22/2022   CALCIUM 10.4 12/18/2022   PROT 7.8 12/18/2022   ALBUMIN 4.5 05/22/2022   BILITOT 0.4 12/18/2022   ALKPHOS 40 05/22/2022   AST 22 12/18/2022   ALT 27 12/18/2022   Last lipids Lab Results  Component Value Date   CHOL 148  12/18/2022   HDL 53 12/18/2022   LDLCALC 74 12/18/2022   TRIG 130 12/18/2022   CHOLHDL 2.8 12/18/2022   Last thyroid  functions Lab Results  Component Value Date   TSH 2.32 10/26/2017   T3TOTAL 100.6 11/13/2014   Last vitamin B12 and Folate Lab  Results  Component Value Date   VITAMINB12 852 10/26/2017   IMPRESSION AND PLAN:  #1 hypertension, well-controlled on HCTZ 25 mg a day. Electrolytes and creatinine today.   2.  Mixed hyperlipidemia, doing well long-term on Zocor  40 mg a day and fenofibrate  54 mg daily. Lipid and hepatic panel today.   3.  Chronic renal insufficiency stage II/III. She avoids NSAIDs and hydrates well. Electrolytes and creatinine monitoring today.  4. Osteopenia. Cont calcium and vit D supplement. DEXA ordered.  An After Visit Summary was printed and given to the patient.  FOLLOW UP: Return in about 6 months (around 12/23/2023) for annual CPE (fasting). Next CPE December 2025 Signed:  Arletha Lady, MD           06/23/2023

## 2023-06-24 ENCOUNTER — Ambulatory Visit: Payer: Self-pay | Admitting: Family Medicine

## 2023-06-24 LAB — COMPREHENSIVE METABOLIC PANEL WITH GFR
AG Ratio: 1.6 (calc) (ref 1.0–2.5)
ALT: 20 U/L (ref 6–29)
AST: 19 U/L (ref 10–35)
Albumin: 4.6 g/dL (ref 3.6–5.1)
Alkaline phosphatase (APISO): 37 U/L (ref 37–153)
BUN/Creatinine Ratio: 31 (calc) — ABNORMAL HIGH (ref 6–22)
BUN: 29 mg/dL — ABNORMAL HIGH (ref 7–25)
CO2: 31 mmol/L (ref 20–32)
Calcium: 10.3 mg/dL (ref 8.6–10.4)
Chloride: 101 mmol/L (ref 98–110)
Creat: 0.95 mg/dL (ref 0.60–1.00)
Globulin: 2.8 g/dL (ref 1.9–3.7)
Glucose, Bld: 79 mg/dL (ref 65–99)
Potassium: 4.1 mmol/L (ref 3.5–5.3)
Sodium: 141 mmol/L (ref 135–146)
Total Bilirubin: 0.4 mg/dL (ref 0.2–1.2)
Total Protein: 7.4 g/dL (ref 6.1–8.1)
eGFR: 62 mL/min/{1.73_m2} (ref >=60)

## 2023-06-24 LAB — MEASLES/MUMPS/RUBELLA IMMUNITY
Mumps IgG: >300 [AU]/ml
Rubella: 29.4 {index}
Rubeola IgG: >300 [AU]/ml

## 2023-06-24 LAB — LIPID PANEL
Cholesterol: 157 mg/dL (ref <200)
HDL: 50 mg/dL (ref >=50)
LDL Cholesterol (Calc): 80 mg/dL
Non-HDL Cholesterol (Calc): 107 mg/dL (ref <130)
Total CHOL/HDL Ratio: 3.1 (calc) (ref <5.0)
Triglycerides: 168 mg/dL — ABNORMAL HIGH (ref <150)

## 2023-06-25 NOTE — Telephone Encounter (Signed)
 No further action needed at this time.

## 2023-08-16 ENCOUNTER — Other Ambulatory Visit: Payer: Self-pay | Admitting: Family Medicine

## 2023-10-07 ENCOUNTER — Ambulatory Visit

## 2023-10-07 ENCOUNTER — Encounter: Payer: Self-pay | Admitting: Family Medicine

## 2023-10-07 DIAGNOSIS — M8580 Other specified disorders of bone density and structure, unspecified site: Secondary | ICD-10-CM

## 2023-10-07 DIAGNOSIS — E348 Other specified endocrine disorders: Secondary | ICD-10-CM

## 2023-11-12 ENCOUNTER — Other Ambulatory Visit: Payer: Self-pay | Admitting: Family Medicine

## 2023-12-21 ENCOUNTER — Encounter: Payer: Self-pay | Admitting: Family Medicine

## 2023-12-23 ENCOUNTER — Encounter: Payer: Self-pay | Admitting: Family Medicine

## 2023-12-23 ENCOUNTER — Ambulatory Visit: Admitting: Family Medicine

## 2023-12-23 VITALS — BP 132/77 | HR 69 | Temp 96.4°F | Ht 64.0 in | Wt 141.0 lb

## 2023-12-23 DIAGNOSIS — N2889 Other specified disorders of kidney and ureter: Secondary | ICD-10-CM | POA: Diagnosis not present

## 2023-12-23 DIAGNOSIS — M25512 Pain in left shoulder: Secondary | ICD-10-CM

## 2023-12-23 DIAGNOSIS — E7800 Pure hypercholesterolemia, unspecified: Secondary | ICD-10-CM

## 2023-12-23 DIAGNOSIS — I10 Essential (primary) hypertension: Secondary | ICD-10-CM

## 2023-12-23 DIAGNOSIS — Z0000 Encounter for general adult medical examination without abnormal findings: Secondary | ICD-10-CM

## 2023-12-23 DIAGNOSIS — M7542 Impingement syndrome of left shoulder: Secondary | ICD-10-CM

## 2023-12-23 NOTE — Progress Notes (Signed)
 Office Note 12/23/2023  CC:  Chief Complaint  Patient presents with   Annual Exam    Pt is fasting   Patient is a 78 y.o. female who is here for annual health maintenance exam and 17-month follow-up hypertension, hyperlipidemia, and chronic renal insufficiency. A/P as of last visit: #1 hypertension, well-controlled on HCTZ 25 mg a day. Electrolytes and creatinine today.   2.  Mixed hyperlipidemia, doing well long-term on Zocor  40 mg a day and fenofibrate  54 mg daily. Lipid and hepatic panel today.   3.  Chronic renal insufficiency stage II/III. She avoids NSAIDs and hydrates well. Electrolytes and creatinine monitoring today.   4. Osteopenia. Cont calcium and vit D supplement. DEXA ordered.  INTERIM HX: Englert feels well overall. She has been having a few weeks of left shoulder pain.  She had been doing some stretching exercises regularly prior to the onset of the pain.  She does not remember any acute injury, though. It aches a lot in the mornings and as she goes to day it is hard to lift and reach out and up. Denies any significant neck pain.  No radiating arm pain or paresthesias.  She has some chronic mild sensation of paresthesias in the hand/all fingers diffusely.  This seems to wax and wane and has been present before her shoulder hurt.  She has not had any shoulder procedures/surgeries.    Past Medical History:  Diagnosis Date   Acquired hammertoes of both feet    Allergy    Anterior subluxation of right shoulder 07/2017   Relocated by Dr. Claudene   Cervical disc disorder with radiculopathy                       Chronic renal insufficiency, stage 3 (moderate)    GFR 50s (avg sCr around 1.0)   Colon cancer screening    Colonoscopy normal 2008.  Cologuard NEG 06/2016.  Repeat 12/2019 NEG. Consider rpt 2024.   Diverticulosis    Duodenal diverticulum 11/2017   pancreatic cyst vs duodenal diverticulum partially obstructing ampulla??  Mild dilatation of CBD  and main pancreatic duct.  Repeat MRI 02/2018 showed NORMAL pancreas and confirmed duodenal diverticulum.  CBD normal.   Family history of abdominal aortic aneurysm    pt has had AAA screening x 2, most recent was 02/2015.   Fracture of navicular bone of right wrist 2022   [facility]   Greater trochanteric pain syndrome of left lower extremity    2024--PT helpful   Hepatic steatosis    mild (MRI abd 02/2018)   History of migraine    These resolved after menopause   HTN (hypertension)    + hypertensive retinopathy   Hyperlipidemia, mixed    Insomnia    maintenance problems; trazodone no help   Intractable hiccups 10/2017   resolved with PPI and reglan   Left rotator cuff tear arthropathy 04/2015   Dr. Darlyn Claudene   Nephrolithiasis 1994   OAB (overactive bladder)    Osteoarthritis    hips, mild on radiographs 03/2022   Osteopenia 08/2018   DEXA 08/2018 T-score -1.2.  Rpt 04/2021 T score -0.9. 10/2023 T score -1.2.   Other fatigue 11/30/2017   Renal cysts, acquired, bilateral 11/12/2017   R kidney with 2.4x 2.4x 2.4cm complex cyst-->MR abd-->Bosniak 1 and 2, no worrisome renal lesion.  Stable on MRI abd 02/2018.   Retinal tear of right eye    Laser retinopexy 9/1 and 9/8, 2020.  Past Surgical History:  Procedure Laterality Date   Aortic ultrasound  02/13/13; 02/2015   no aneurism (screening for high risk)   BUNIONECTOMY  2006   bilat   carotid dopplers (screening for PAD)  11/16/2008   minor left carotid dz per old records.  No hx of TIA/CVA.   CESAREAN SECTION  x 2    1997 & 1999   COLONOSCOPY  07/2006   recall 10 yrs.  'tics.  (Dr. Darrick in [place], [place])   CYST EXCISION     right index finger, base of fingernail   DEXA  08/2018   2020 T-score -1.2.  04/2021 T score -0.9.  10/2023 T score -1.2   EYE SURGERY  cataracts   kidney stone extraction  1992   Cystoscopy: fragmented ureteral stone.  No procedures for this since then.   laser retinopexy Right 9/1 and 9/8,2020.    Family  History  Problem Relation Age of Onset   Cancer Mother        ? ovarian vs endometrial   Hyperlipidemia Father    Hypertension Father    Bladder Cancer Brother    AAA (abdominal aortic aneurysm) Brother    Cancer Brother    Colon cancer Other        Negative history.   Diabetes Other    Kidney disease Brother    AAA (abdominal aortic aneurysm) Brother    Dementia Brother    Heart disease Brother    AAA (abdominal aortic aneurysm) Brother    Diabetes Brother    Hypertension Brother    Hyperlipidemia Brother    Diabetes Brother    Hypertension Brother     Social History   Socioeconomic History   Marital status: Married    Spouse name: Not on file   Number of children: Not on file   Years of education: Not on file   Highest education level: 12th grade  Occupational History   Not on file  Tobacco Use   Smoking status: Never   Smokeless tobacco: Never  Vaping Use   Vaping status: Never Used  Substance and Sexual Activity   Alcohol use: Yes    Alcohol/week: 1.0 standard drink of alcohol    Types: 1 Glasses of wine per week    Comment: rarely   Drug use: Never   Sexual activity: Not Currently  Other Topics Concern   Not on file  Social History Narrative   Married, 2 daughters.  Four older brothers, 3 of which have had abdominal aneurisms.   Relocated from [place] area 05/2013.   Occupation: retired advice worker.   No tob, occ alcohol.     Social Drivers of Health   Tobacco Use: Low Risk (12/23/2023)   Patient History    Smoking Tobacco Use: Never    Smokeless Tobacco Use: Never    Passive Exposure: Not on file  Financial Resource Strain: Low Risk (12/21/2023)   Overall Financial Resource Strain (CARDIA)    Difficulty of Paying Living Expenses: Not hard at all  Food Insecurity: No Food Insecurity (12/21/2023)   Epic    Worried About Programme Researcher, Broadcasting/film/video in the Last Year: Never true    Ran Out of Food in the Last Year: Never true  Transportation Needs:  No Transportation Needs (12/21/2023)   Epic    Lack of Transportation (Medical): No    Lack of Transportation (Non-Medical): No  Physical Activity: Insufficiently Active (12/21/2023)   Exercise Vital Sign  Days of Exercise per Week: 5 days    Minutes of Exercise per Session: 10 min  Stress: No Stress Concern Present (12/21/2023)   [employer] of Occupational Health - Occupational Stress Questionnaire    Feeling of Stress: Only a little  Social Connections: Moderately Isolated (12/21/2023)   Social Connection and Isolation Panel    Frequency of Communication with Friends and Family: More than three times a week    Frequency of Social Gatherings with Friends and Family: Once a week    Attends Religious Services: Never    Database Administrator or Organizations: No    Attends Engineer, Structural: Not on file    Marital Status: Married  Catering Manager Violence: Not At Risk (12/10/2022)   Humiliation, Afraid, Rape, and Kick questionnaire    Fear of Current or Ex-Partner: No    Emotionally Abused: No    Physically Abused: No    Sexually Abused: No  Depression (PHQ2-9): Low Risk (12/23/2023)   Depression (PHQ2-9)    PHQ-2 Score: 3  Alcohol Screen: Low Risk (12/21/2023)   Alcohol Screen    Last Alcohol Screening Score (AUDIT): 2  Housing: Low Risk (12/21/2023)   Epic    Unable to Pay for Housing in the Last Year: No    Number of Times Moved in the Last Year: 0    Homeless in the Last Year: No  Utilities: Not At Risk (12/10/2022)   [facility] Utilities    Threatened with loss of utilities: No  Health Literacy: Adequate Health Literacy (12/10/2022)   B1300 Health Literacy    Frequency of need for help with medical instructions: Never    Outpatient Medications Prior to Visit  Medication Sig Dispense Refill   calcium citrate-vitamin D (CITRACAL+D) 315-200 MG-UNIT per tablet Take 1 tablet by mouth daily.     Cholecalciferol (VITAMIN D) 2000 units tablet Take 2,000 Units by  mouth daily.     fenofibrate  54 MG tablet Take 1 tablet (54 mg total) by mouth daily. 90 tablet 1   hydrochlorothiazide  (HYDRODIURIL ) 25 MG tablet TAKE 1 TABLET (25 MG TOTAL) BY MOUTH DAILY. 90 tablet 0   Multiple Vitamins-Minerals (LUTEIN-ZEAXANTHIN PO) Take 125 mg by mouth daily.     Multiple Vitamins-Minerals (MULTIVITAMIN ADULTS PO) Take by mouth daily.     simvastatin  (ZOCOR ) 40 MG tablet TAKE 1 TABLET BY MOUTH EVERY DAY 90 tablet 0   No facility-administered medications prior to visit.    Allergies[1]  Review of Systems  Constitutional:  Negative for appetite change, chills, fatigue and fever.  HENT:  Negative for congestion, dental problem, ear pain and sore throat.   Eyes:  Negative for discharge, redness and visual disturbance.  Respiratory:  Negative for cough, chest tightness, shortness of breath and wheezing.   Cardiovascular:  Negative for chest pain, palpitations and leg swelling.  Gastrointestinal:  Negative for abdominal pain, blood in stool, diarrhea, nausea and vomiting.  Genitourinary:  Negative for difficulty urinating, dysuria, flank pain, frequency, hematuria and urgency.  Musculoskeletal:  Positive for arthralgias (left shoulder). Negative for back pain, joint swelling, myalgias and neck stiffness.  Skin:  Negative for pallor and rash.  Neurological:  Negative for dizziness, speech difficulty, weakness and headaches.  Hematological:  Negative for adenopathy. Does not bruise/bleed easily.  Psychiatric/Behavioral:  Negative for confusion and sleep disturbance. The patient is not nervous/anxious.     PE;    12/23/2023    8:36 AM 06/23/2023    9:34 AM 12/18/2022  8:41 AM  Vitals with BMI  Height 5' 4 5' 4   Weight 141 lbs 143 lbs 3 oz   BMI 24.19 24.57   Systolic 132 122 863  Diastolic 77 80 84  Pulse 69 81      Gen: Alert, well appearing.  Patient is oriented to person, place, time, and situation. AFFECT: pleasant, lucid thought and speech. ENT:  Ears: EACs clear, normal epithelium.  TMs with good light reflex and landmarks bilaterally.  Eyes: no injection, icteris, swelling, or exudate.  EOMI, PERRLA. Nose: no drainage or turbinate edema/swelling.  No injection or focal lesion.  Mouth: lips without lesion/swelling.  Oral mucosa pink and moist.  Dentition intact and without obvious caries or gingival swelling.  Oropharynx without erythema, exudate, or swelling.  Neck: supple/nontender.  No LAD, mass, or TM.  Carotid pulses 2+ bilaterally, without bruits. CV: RRR, no m/r/g.   LUNGS: CTA bilat, nonlabored resps, good aeration in all lung fields. ABD: soft, NT, ND, BS normal.  No hepatospenomegaly or mass.  No bruits. EXT: no clubbing, cyanosis, or edema.  Left shoulder with some tenderness to palpation anterolaterally.  Speeds and Neer's positive at about 90 degrees of flexion.  Hawkins positive, O'Brien's positive.  Internal range of motion is fully intact.  Arm strength 5 out of 5 proximally and distally bilaterally.  Range of motion of neck intact.  No posterolateral neck tenderness. Skin - no sores or suspicious lesions or rashes or color changes  Pertinent labs:  Lab Results  Component Value Date   TSH 2.32 10/26/2017   Lab Results  Component Value Date   WBC 6.7 12/18/2022   HGB 14.6 12/18/2022   HCT 42.4 12/18/2022   MCV 89.6 12/18/2022   PLT 310 12/18/2022   Lab Results  Component Value Date   CREATININE 0.95 06/23/2023   BUN 29 (H) 06/23/2023   NA 141 06/23/2023   K 4.1 06/23/2023   CL 101 06/23/2023   CO2 31 06/23/2023   Lab Results  Component Value Date   ALT 20 06/23/2023   AST 19 06/23/2023   ALKPHOS 40 05/22/2022   BILITOT 0.4 06/23/2023   Lab Results  Component Value Date   CHOL 157 06/23/2023   Lab Results  Component Value Date   HDL 50 06/23/2023   Lab Results  Component Value Date   LDLCALC 80 06/23/2023   Lab Results  Component Value Date   TRIG 168 (H) 06/23/2023   Lab Results   Component Value Date   CHOLHDL 3.1 06/23/2023   ASSESSMENT AND PLAN:   1 health maintenance exam: Reviewed age and gender appropriate health maintenance issues (prudent diet, regular exercise, health risks of tobacco and excessive alcohol, use of seatbelts, fire alarms in home, use of sunscreen).  Also reviewed age and gender appropriate health screening as well as vaccine recommendations. Vaccines: ALL UTD. Labs: CBC, CMET, FLP. Cervical ca screening: no further pap/pelvic indicated. Breast ca screening: Mammo due 02/2024. Colon ca screening: hx of normal colonoscopy 2008.  Cologuard neg 2018 and 2021.  Consider rpt cologuard but pt prefers to decline any further col ca screening. Osteoporosis screening: DEXA 10/2023 --->T score -1.2.  plan repeat DEXA 2 yrs.  #2 hypertension, well-controlled on HCTZ 25 mg a day. Electrolytes and creatinine today.   3.  Mixed hyperlipidemia, doing well long-term on Zocor  40 mg a day and fenofibrate  54 mg daily. Lipid and hepatic panel today.   4.  Chronic renal insufficiency stage II/III. She  avoids NSAIDs and hydrates well. Electrolytes and creatinine monitoring today.  #5 left shoulder pain. Suspect multifactorial--> impingement syndrome, osteoarthritis. Bedside [facility] ultrasound did show what appeared to be a chronic complete supraspinatus tear. She will do some home rehab exercises and we will see how this goes over time.  An After Visit Summary was printed and given to the patient.  FOLLOW UP:  Return in about 6 months (around 06/22/2024) for routine chronic illness f/u.  Signed:  Gerlene Hockey, MD           12/23/2023      [1]  Allergies Allergen Reactions   Metoclopramide Shortness Of Breath    SOB, inc HR, nausea, fatigue   Erythromycin Other (See Comments)    Abdominal pain   Tetracyclines & Related Other (See Comments)    Joint pain   Lisinopril  Cough

## 2023-12-23 NOTE — Patient Instructions (Signed)
 Shoulder Impingement Syndrome Rehab Ask your health care provider which exercises are safe for you. Do exercises exactly as told by your provider and adjust them as told. It is normal to feel mild stretching, pulling, tightness, or discomfort as you do these exercises. Stop right away if you feel sudden pain or your pain gets worse. Do not begin these exercises until told by your provider. Stretching and range-of-motion exercise This exercise warms up your muscles and joints and improves the movement and flexibility of your shoulder. This exercise also helps to relieve pain and stiffness. Passive horizontal adduction In passive adduction, you use your other hand to move the injured arm toward your body. The injured arm does not move on its own. In this movement, your arm is moved across your body in the horizontal plane (horizontal adduction). Sit or stand and pull your left / right elbow across your chest, toward your other shoulder. Stop when you feel a gentle stretch in the back of your shoulder and upper arm. Keep your arm at shoulder height. Keep your arm as close to your body as you comfortably can. Hold for __________ seconds. Slowly return to the starting position. Repeat __________ times. Complete this exercise __________ times a day. Strengthening exercises These exercises build strength and endurance in your shoulder. Endurance is the ability to use your muscles for a long time, even after they get tired. External rotation, isometric This is an exercise in which you press the back of your wrist against a doorframe without moving your shoulder joint (isometric). Stand or sit in a doorway, facing the door frame. Bend your left / right elbow and place the back of your wrist against the doorframe. Only the back of your wrist should be touching the frame. Keep your upper arm at your side. Gently press your wrist against the doorframe, as if you are trying to push your arm away from your  abdomen (external rotation). Press as hard as you are able without pain. Avoid shrugging your shoulder while you press your wrist against the doorframe. Keep your shoulder blade tucked down toward the middle of your back. Hold for __________ seconds. Slowly release the tension, and relax your muscles completely before you repeat the exercise. Repeat __________ times. Complete this exercise __________ times a day. Internal rotation, isometric This is an exercise in which you press your palm against a doorframe without moving your shoulder joint (isometric). Stand or sit in a doorway, facing the doorframe. Bend your left / right elbow and place the palm of your hand against the doorframe. Only your palm should be touching the frame. Keep your upper arm at your side. Gently press your hand against the doorframe, as if you are trying to push your arm toward your abdomen (internal rotation). Press as hard as you are able without pain. Avoid shrugging your shoulder while you press your hand against the doorframe. Keep your shoulder blade tucked down toward the middle of your back. Hold for __________ seconds. Slowly release the tension, and relax your muscles completely before you repeat the exercise. Repeat __________ times. Complete this exercise __________ times a day. Scapular protraction, supine, isotonic  Lie on your back on a firm surface (supine position). Hold a __________ weight in your left / right hand. Raise your left / right arm straight into the air so your hand is directly above your shoulder joint. Push the weight into the air so your shoulder (scapula) lifts off the surface that you are lying on.  The scapula will push up or forward (protraction). Do not move your head, neck, or back. Hold for __________ seconds. Slowly return to the starting position. Let your muscles relax completely before you repeat this exercise. Repeat __________ times. Complete this exercise __________ times a  day. Scapular retraction, isotonic  Sit in a stable chair without armrests or stand up. Secure an exercise band to a stable object in front of you so the band is at shoulder height. Hold one end of the exercise band in each hand. Squeeze your shoulder blades together (retraction) and move your elbows slightly behind you. Do not shrug your shoulders upward while you do this. Hold for __________ seconds. Slowly return to the starting position. Repeat __________ times. Complete this exercise __________ times a day. Shoulder extension, isotonic  Sit in a stable chair without armrests or stand up. Secure an exercise band to a stable object in front of you so the band is above shoulder height. Hold one end of the exercise band in each hand. Straighten your elbows and lift your hands up to shoulder height. Squeeze your shoulder blades together and pull your hands down to the sides of your thighs (extension). Stop when your hands are straight down by your sides. Do not let your hands go behind your body. Hold for __________ seconds. Slowly return to the starting position. Repeat __________ times. Complete this exercise __________ times a day. This information is not intended to replace advice given to you by your health care provider. Make sure you discuss any questions you have with your health care provider. Document Revised: 09/19/2021 Document Reviewed: 09/19/2021 Elsevier Patient Education  2024 [employer]Shoulder Impingement Syndrome Rehab Ask your health care provider which exercises are safe for you. Do exercises exactly as told by your provider and adjust them as told. It is normal to feel mild stretching, pulling, tightness, or discomfort as you do these exercises. Stop right away if you feel sudden pain or your pain gets worse. Do not begin these exercises until told by your provider. Stretching and range-of-motion exercise This exercise warms up your muscles and joints and improves  the movement and flexibility of your shoulder. This exercise also helps to relieve pain and stiffness. Passive horizontal adduction In passive adduction, you use your other hand to move the injured arm toward your body. The injured arm does not move on its own. In this movement, your arm is moved across your body in the horizontal plane (horizontal adduction). Sit or stand and pull your left / right elbow across your chest, toward your other shoulder. Stop when you feel a gentle stretch in the back of your shoulder and upper arm. Keep your arm at shoulder height. Keep your arm as close to your body as you comfortably can. Hold for __________ seconds. Slowly return to the starting position. Repeat __________ times. Complete this exercise __________ times a day. Strengthening exercises These exercises build strength and endurance in your shoulder. Endurance is the ability to use your muscles for a long time, even after they get tired. External rotation, isometric This is an exercise in which you press the back of your wrist against a doorframe without moving your shoulder joint (isometric). Stand or sit in a doorway, facing the door frame. Bend your left / right elbow and place the back of your wrist against the doorframe. Only the back of your wrist should be touching the frame. Keep your upper arm at your side. Gently press your wrist against the  doorframe, as if you are trying to push your arm away from your abdomen (external rotation). Press as hard as you are able without pain. Avoid shrugging your shoulder while you press your wrist against the doorframe. Keep your shoulder blade tucked down toward the middle of your back. Hold for __________ seconds. Slowly release the tension, and relax your muscles completely before you repeat the exercise. Repeat __________ times. Complete this exercise __________ times a day. Internal rotation, isometric This is an exercise in which you press your palm  against a doorframe without moving your shoulder joint (isometric). Stand or sit in a doorway, facing the doorframe. Bend your left / right elbow and place the palm of your hand against the doorframe. Only your palm should be touching the frame. Keep your upper arm at your side. Gently press your hand against the doorframe, as if you are trying to push your arm toward your abdomen (internal rotation). Press as hard as you are able without pain. Avoid shrugging your shoulder while you press your hand against the doorframe. Keep your shoulder blade tucked down toward the middle of your back. Hold for __________ seconds. Slowly release the tension, and relax your muscles completely before you repeat the exercise. Repeat __________ times. Complete this exercise __________ times a day. Scapular protraction, supine, isotonic  Lie on your back on a firm surface (supine position). Hold a __________ weight in your left / right hand. Raise your left / right arm straight into the air so your hand is directly above your shoulder joint. Push the weight into the air so your shoulder (scapula) lifts off the surface that you are lying on. The scapula will push up or forward (protraction). Do not move your head, neck, or back. Hold for __________ seconds. Slowly return to the starting position. Let your muscles relax completely before you repeat this exercise. Repeat __________ times. Complete this exercise __________ times a day. Scapular retraction, isotonic  Sit in a stable chair without armrests or stand up. Secure an exercise band to a stable object in front of you so the band is at shoulder height. Hold one end of the exercise band in each hand. Squeeze your shoulder blades together (retraction) and move your elbows slightly behind you. Do not shrug your shoulders upward while you do this. Hold for __________ seconds. Slowly return to the starting position. Repeat __________ times. Complete this  exercise __________ times a day. Shoulder extension, isotonic  Sit in a stable chair without armrests or stand up. Secure an exercise band to a stable object in front of you so the band is above shoulder height. Hold one end of the exercise band in each hand. Straighten your elbows and lift your hands up to shoulder height. Squeeze your shoulder blades together and pull your hands down to the sides of your thighs (extension). Stop when your hands are straight down by your sides. Do not let your hands go behind your body. Hold for __________ seconds. Slowly return to the starting position. Repeat __________ times. Complete this exercise __________ times a day. This information is not intended to replace advice given to you by your health care provider. Make sure you discuss any questions you have with your health care provider. Document Revised: 09/19/2021 Document Reviewed: 09/19/2021 Elsevier Patient Education  2024 [employer].

## 2023-12-24 ENCOUNTER — Ambulatory Visit: Payer: Self-pay | Admitting: Family Medicine

## 2023-12-24 LAB — BASIC METABOLIC PANEL WITH GFR
BUN: 24 mg/dL (ref 7–25)
CO2: 32 mmol/L (ref 20–32)
Calcium: 10.8 mg/dL — ABNORMAL HIGH (ref 8.6–10.4)
Chloride: 100 mmol/L (ref 98–110)
Creat: 0.97 mg/dL (ref 0.60–1.00)
Glucose, Bld: 84 mg/dL (ref 65–99)
Potassium: 4 mmol/L (ref 3.5–5.3)
Sodium: 141 mmol/L (ref 135–146)
eGFR: 60 mL/min/1.73m2 (ref >=60)

## 2023-12-24 LAB — LIPID PANEL
Cholesterol: 151 mg/dL (ref <200)
HDL: 53 mg/dL (ref >=50)
LDL Cholesterol (Calc): 73 mg/dL
Non-HDL Cholesterol (Calc): 98 mg/dL (ref <130)
Total CHOL/HDL Ratio: 2.8 (calc) (ref <5.0)
Triglycerides: 176 mg/dL — ABNORMAL HIGH (ref <150)

## 2023-12-24 LAB — CBC WITH DIFFERENTIAL/PLATELET
Absolute Lymphocytes: 1981 {cells}/uL (ref 850–3900)
Absolute Monocytes: 706 {cells}/uL (ref 200–950)
Basophils Absolute: 51 {cells}/uL (ref 0–200)
Basophils Relative: 0.6 %
Eosinophils Absolute: 128 {cells}/uL (ref 15–500)
Eosinophils Relative: 1.5 %
HCT: 44.1 % (ref 35.9–46.0)
Hemoglobin: 14.8 g/dL (ref 11.7–15.5)
MCH: 30 pg (ref 27.0–33.0)
MCHC: 33.6 g/dL (ref 31.6–35.4)
MCV: 89.3 fL (ref 81.4–101.7)
MPV: 9.8 fL (ref 7.5–12.5)
Monocytes Relative: 8.3 %
Neutro Abs: 5636 {cells}/uL (ref 1500–7800)
Neutrophils Relative %: 66.3 %
Platelets: 302 Thousand/uL (ref 140–400)
RBC: 4.94 Million/uL (ref 3.80–5.10)
RDW: 12.3 % (ref 11.0–15.0)
Total Lymphocyte: 23.3 %
WBC: 8.5 Thousand/uL (ref 3.8–10.8)

## 2024-02-07 ENCOUNTER — Other Ambulatory Visit: Payer: Self-pay | Admitting: Family Medicine

## 2024-06-22 ENCOUNTER — Ambulatory Visit: Admitting: Family Medicine
